# Patient Record
Sex: Female | Born: 1955 | Race: White | Hispanic: No | Marital: Married
Health system: Southern US, Academic
[De-identification: ages and names within clinical notes are randomized; demographics above are authoritative.]

## PROBLEM LIST (undated history)

## (undated) ENCOUNTER — Encounter

## (undated) ENCOUNTER — Encounter: Attending: Clinical | Primary: Clinical

## (undated) ENCOUNTER — Encounter
Attending: Student in an Organized Health Care Education/Training Program | Primary: Student in an Organized Health Care Education/Training Program

## (undated) ENCOUNTER — Encounter: Payer: MEDICAID | Attending: Family | Primary: Family

## (undated) ENCOUNTER — Telehealth: Attending: Family | Primary: Family

## (undated) ENCOUNTER — Telehealth
Attending: Student in an Organized Health Care Education/Training Program | Primary: Student in an Organized Health Care Education/Training Program

## (undated) ENCOUNTER — Ambulatory Visit

## (undated) ENCOUNTER — Telehealth: Attending: Internal Medicine | Primary: Internal Medicine

## (undated) ENCOUNTER — Telehealth: Attending: Clinical | Primary: Clinical

## (undated) ENCOUNTER — Telehealth

## (undated) ENCOUNTER — Encounter: Attending: Internal Medicine | Primary: Internal Medicine

## (undated) ENCOUNTER — Encounter: Attending: MOHS-Micrographic Surgery | Primary: MOHS-Micrographic Surgery

## (undated) ENCOUNTER — Encounter: Attending: Family | Primary: Family

## (undated) ENCOUNTER — Encounter: Payer: MEDICAID | Attending: Clinical | Primary: Clinical

## (undated) ENCOUNTER — Ambulatory Visit: Attending: Internal Medicine | Primary: Internal Medicine

## (undated) ENCOUNTER — Telehealth: Attending: MOHS-Micrographic Surgery | Primary: MOHS-Micrographic Surgery

## (undated) ENCOUNTER — Telehealth: Attending: Cardiovascular Disease | Primary: Cardiovascular Disease

## (undated) ENCOUNTER — Encounter
Attending: Pharmacist Clinician (PhC)/ Clinical Pharmacy Specialist | Primary: Pharmacist Clinician (PhC)/ Clinical Pharmacy Specialist

## (undated) ENCOUNTER — Encounter
Payer: MEDICAID | Attending: Student in an Organized Health Care Education/Training Program | Primary: Student in an Organized Health Care Education/Training Program

## (undated) ENCOUNTER — Encounter: Payer: MEDICAID | Attending: Gastroenterology | Primary: Gastroenterology

## (undated) ENCOUNTER — Ambulatory Visit
Attending: Pharmacist Clinician (PhC)/ Clinical Pharmacy Specialist | Primary: Pharmacist Clinician (PhC)/ Clinical Pharmacy Specialist

## (undated) ENCOUNTER — Encounter: Attending: Gastroenterology | Primary: Gastroenterology

## (undated) ENCOUNTER — Ambulatory Visit: Payer: MEDICAID

## (undated) ENCOUNTER — Non-Acute Institutional Stay
Payer: MEDICAID | Attending: Student in an Organized Health Care Education/Training Program | Primary: Student in an Organized Health Care Education/Training Program

## (undated) ENCOUNTER — Telehealth: Attending: Dermatology | Primary: Dermatology

## (undated) ENCOUNTER — Encounter: Attending: Cardiovascular Disease | Primary: Cardiovascular Disease

## (undated) ENCOUNTER — Encounter: Payer: MEDICAID | Attending: Psychiatry | Primary: Psychiatry

## (undated) ENCOUNTER — Ambulatory Visit
Attending: Student in an Organized Health Care Education/Training Program | Primary: Student in an Organized Health Care Education/Training Program

## (undated) ENCOUNTER — Ambulatory Visit: Attending: Family | Primary: Family

## (undated) ENCOUNTER — Non-Acute Institutional Stay: Payer: MEDICAID

## (undated) ENCOUNTER — Ambulatory Visit: Attending: Dermatology | Primary: Dermatology

## (undated) ENCOUNTER — Telehealth: Attending: Nephrology | Primary: Nephrology

## (undated) ENCOUNTER — Encounter: Payer: MEDICAID | Attending: Emergency Medicine | Primary: Emergency Medicine

## (undated) DIAGNOSIS — E119 Type 2 diabetes mellitus without complications: Secondary | ICD-10-CM

## (undated) DIAGNOSIS — G629 Polyneuropathy, unspecified: Secondary | ICD-10-CM

## (undated) DIAGNOSIS — M359 Systemic involvement of connective tissue, unspecified: Secondary | ICD-10-CM

## (undated) DIAGNOSIS — IMO0002 Reserved for concepts with insufficient information to code with codable children: Secondary | ICD-10-CM

## (undated) DIAGNOSIS — I10 Essential (primary) hypertension: Secondary | ICD-10-CM

## (undated) DIAGNOSIS — M1990 Unspecified osteoarthritis, unspecified site: Secondary | ICD-10-CM

## (undated) DIAGNOSIS — L120 Bullous pemphigoid: Secondary | ICD-10-CM

## (undated) DIAGNOSIS — J45909 Unspecified asthma, uncomplicated: Secondary | ICD-10-CM

## (undated) DIAGNOSIS — M329 Systemic lupus erythematosus, unspecified: Secondary | ICD-10-CM

## (undated) HISTORY — PX: ABDOMINAL SURGERY: SHX537

## (undated) HISTORY — PX: ABDOMINAL HYSTERECTOMY: SHX81

---
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
## 2004-12-15 ENCOUNTER — Emergency Department: Payer: Self-pay | Admitting: Emergency Medicine

## 2005-04-30 ENCOUNTER — Other Ambulatory Visit: Payer: Self-pay

## 2005-04-30 ENCOUNTER — Inpatient Hospital Stay: Payer: Self-pay

## 2005-11-29 ENCOUNTER — Emergency Department: Payer: Self-pay | Admitting: General Practice

## 2005-11-29 ENCOUNTER — Other Ambulatory Visit: Payer: Self-pay

## 2005-12-03 ENCOUNTER — Ambulatory Visit: Payer: Self-pay | Admitting: General Practice

## 2007-07-29 ENCOUNTER — Emergency Department: Payer: Self-pay | Admitting: Emergency Medicine

## 2007-10-24 ENCOUNTER — Other Ambulatory Visit: Payer: Self-pay

## 2007-10-24 ENCOUNTER — Emergency Department: Payer: Self-pay | Admitting: Emergency Medicine

## 2008-03-25 ENCOUNTER — Inpatient Hospital Stay: Payer: Self-pay | Admitting: Internal Medicine

## 2009-01-16 ENCOUNTER — Emergency Department: Payer: Self-pay | Admitting: Emergency Medicine

## 2012-05-29 ENCOUNTER — Emergency Department: Payer: Self-pay | Admitting: Internal Medicine

## 2012-05-29 LAB — URINALYSIS, COMPLETE
Bilirubin,UR: NEGATIVE
Blood: NEGATIVE
Glucose,UR: NEGATIVE mg/dL (ref 0–75)
Hyaline Cast: 33
Nitrite: POSITIVE
Ph: 5 (ref 4.5–8.0)
Protein: NEGATIVE
RBC,UR: 7 /HPF (ref 0–5)
Specific Gravity: 1.016 (ref 1.003–1.030)
Squamous Epithelial: 3

## 2012-05-29 LAB — COMPREHENSIVE METABOLIC PANEL
Albumin: 4.3 g/dL (ref 3.4–5.0)
Alkaline Phosphatase: 140 U/L — ABNORMAL HIGH (ref 50–136)
Bilirubin,Total: 0.3 mg/dL (ref 0.2–1.0)
Chloride: 100 mmol/L (ref 98–107)
Co2: 25 mmol/L (ref 21–32)
Creatinine: 2.18 mg/dL — ABNORMAL HIGH (ref 0.60–1.30)
EGFR (Non-African Amer.): 25 — ABNORMAL LOW
Osmolality: 271 (ref 275–301)
Sodium: 132 mmol/L — ABNORMAL LOW (ref 136–145)
Total Protein: 8.7 g/dL — ABNORMAL HIGH (ref 6.4–8.2)

## 2012-05-29 LAB — CBC
HCT: 35.3 % (ref 35.0–47.0)
HGB: 11.1 g/dL — ABNORMAL LOW (ref 12.0–16.0)
RDW: 18.5 % — ABNORMAL HIGH (ref 11.5–14.5)
WBC: 10.7 10*3/uL (ref 3.6–11.0)

## 2012-05-29 LAB — LIPASE, BLOOD: Lipase: 389 U/L (ref 73–393)

## 2016-12-24 MED ORDER — LISINOPRIL 10 MG-HYDROCHLOROTHIAZIDE 12.5 MG TABLET
ORAL_TABLET | Freq: Every day | ORAL | 3 refills | 0 days | Status: CP
Start: 2016-12-24 — End: 2017-02-21

## 2016-12-25 ENCOUNTER — Ambulatory Visit: Admission: RE | Admit: 2016-12-25 | Discharge: 2016-12-25 | Disposition: A | Discharge status: Home / Self Care

## 2016-12-25 DIAGNOSIS — Z8719 Personal history of other diseases of the digestive system: Principal | ICD-10-CM

## 2017-01-01 ENCOUNTER — Ambulatory Visit: Admission: RE | Admit: 2017-01-01 | Discharge: 2017-01-01 | Disposition: A | Discharge status: Home / Self Care

## 2017-01-01 DIAGNOSIS — Z8719 Personal history of other diseases of the digestive system: Principal | ICD-10-CM

## 2017-01-08 ENCOUNTER — Ambulatory Visit: Admission: RE | Admit: 2017-01-08 | Discharge: 2017-01-08 | Disposition: A | Discharge status: Home / Self Care

## 2017-01-08 DIAGNOSIS — Z8719 Personal history of other diseases of the digestive system: Principal | ICD-10-CM

## 2017-02-21 ENCOUNTER — Ambulatory Visit
Admission: RE | Admit: 2017-02-21 | Discharge: 2017-02-21 | Disposition: A | Discharge status: Home / Self Care | Attending: Student in an Organized Health Care Education/Training Program | Admitting: Student in an Organized Health Care Education/Training Program

## 2017-02-21 ENCOUNTER — Ambulatory Visit: Admission: RE | Admit: 2017-02-21 | Discharge: 2017-02-21 | Disposition: A | Discharge status: Home / Self Care

## 2017-02-21 DIAGNOSIS — E039 Hypothyroidism, unspecified: Secondary | ICD-10-CM

## 2017-02-21 DIAGNOSIS — Z8719 Personal history of other diseases of the digestive system: Principal | ICD-10-CM

## 2017-02-21 DIAGNOSIS — Z1211 Encounter for screening for malignant neoplasm of colon: Secondary | ICD-10-CM

## 2017-02-21 DIAGNOSIS — Z1239 Encounter for other screening for malignant neoplasm of breast: Secondary | ICD-10-CM

## 2017-02-21 DIAGNOSIS — Z122 Encounter for screening for malignant neoplasm of respiratory organs: Secondary | ICD-10-CM

## 2017-02-21 DIAGNOSIS — I10 Essential (primary) hypertension: Principal | ICD-10-CM

## 2017-02-21 DIAGNOSIS — H16139 Photokeratitis, unspecified eye: Secondary | ICD-10-CM

## 2017-02-21 DIAGNOSIS — Z87891 Personal history of nicotine dependence: Secondary | ICD-10-CM

## 2017-02-21 MED ORDER — LISINOPRIL 10 MG-HYDROCHLOROTHIAZIDE 12.5 MG TABLET
ORAL_TABLET | Freq: Every day | ORAL | 3 refills | 0.00000 days | Status: CP
Start: 2017-02-21 — End: 2017-09-12

## 2017-03-05 ENCOUNTER — Ambulatory Visit: Admission: RE | Admit: 2017-03-05 | Discharge: 2017-03-05 | Disposition: A | Discharge status: Home / Self Care

## 2017-03-12 ENCOUNTER — Ambulatory Visit: Admission: RE | Admit: 2017-03-12 | Discharge: 2017-03-12 | Attending: Dermatology | Admitting: Dermatology

## 2017-03-12 DIAGNOSIS — H16139 Photokeratitis, unspecified eye: Principal | ICD-10-CM

## 2017-03-12 DIAGNOSIS — L820 Inflamed seborrheic keratosis: Secondary | ICD-10-CM

## 2017-03-12 DIAGNOSIS — D485 Neoplasm of uncertain behavior of skin: Secondary | ICD-10-CM

## 2017-03-12 DIAGNOSIS — L732 Hidradenitis suppurativa: Secondary | ICD-10-CM

## 2017-03-12 MED ORDER — DOXYCYCLINE HYCLATE 100 MG TABLET
ORAL_TABLET | Freq: Two times a day (BID) | ORAL | 2 refills | 0.00000 days | Status: CP
Start: 2017-03-12 — End: 2017-06-10

## 2017-03-15 MED ORDER — TRIAMCINOLONE ACETONIDE 0.1 % TOPICAL OINTMENT
INTRAMUSCULAR | 1 refills | 0.00000 days | Status: CP
Start: 2017-03-15 — End: 2017-10-28

## 2017-04-10 ENCOUNTER — Ambulatory Visit: Admission: RE | Admit: 2017-04-10 | Discharge: 2017-04-10

## 2017-04-10 DIAGNOSIS — H2513 Age-related nuclear cataract, bilateral: Secondary | ICD-10-CM

## 2017-04-10 DIAGNOSIS — H52203 Unspecified astigmatism, bilateral: Secondary | ICD-10-CM

## 2017-04-10 DIAGNOSIS — H5213 Myopia, bilateral: Principal | ICD-10-CM

## 2017-08-08 ENCOUNTER — Emergency Department
Admission: EM | Admit: 2017-08-08 | Discharge: 2017-08-09 | Disposition: A | Discharge status: Home / Self Care | Payer: Self-pay | Attending: Emergency Medicine | Admitting: Emergency Medicine

## 2017-08-08 ENCOUNTER — Other Ambulatory Visit: Payer: Self-pay

## 2017-08-08 ENCOUNTER — Emergency Department: Payer: Self-pay

## 2017-08-08 DIAGNOSIS — R42 Dizziness and giddiness: Secondary | ICD-10-CM | POA: Insufficient documentation

## 2017-08-08 DIAGNOSIS — R0789 Other chest pain: Secondary | ICD-10-CM | POA: Insufficient documentation

## 2017-08-08 DIAGNOSIS — F17200 Nicotine dependence, unspecified, uncomplicated: Secondary | ICD-10-CM | POA: Insufficient documentation

## 2017-08-08 DIAGNOSIS — I10 Essential (primary) hypertension: Secondary | ICD-10-CM | POA: Insufficient documentation

## 2017-08-08 DIAGNOSIS — M25512 Pain in left shoulder: Secondary | ICD-10-CM | POA: Insufficient documentation

## 2017-08-08 HISTORY — DX: Essential (primary) hypertension: I10

## 2017-08-08 LAB — BASIC METABOLIC PANEL
Anion gap: 9 (ref 5–15)
BUN: 25 mg/dL — ABNORMAL HIGH (ref 6–20)
CHLORIDE: 97 mmol/L — AB (ref 101–111)
CO2: 22 mmol/L (ref 22–32)
CREATININE: 1.24 mg/dL — AB (ref 0.44–1.00)
Calcium: 9 mg/dL (ref 8.9–10.3)
GFR calc Af Amer: 54 mL/min — ABNORMAL LOW (ref >60)
GFR calc non Af Amer: 46 mL/min — ABNORMAL LOW (ref >60)
GLUCOSE: 106 mg/dL — AB (ref 65–99)
Potassium: 4.5 mmol/L (ref 3.5–5.1)
SODIUM: 128 mmol/L — AB (ref 135–145)

## 2017-08-08 LAB — TROPONIN I: Troponin I: <0.03 ng/mL (ref <0.03)

## 2017-08-08 LAB — CBC
HCT: 33.7 % — ABNORMAL LOW (ref 35.0–47.0)
Hemoglobin: 11.3 g/dL — ABNORMAL LOW (ref 12.0–16.0)
MCH: 26.5 pg (ref 26.0–34.0)
MCHC: 33.3 g/dL (ref 32.0–36.0)
MCV: 79.3 fL — AB (ref 80.0–100.0)
PLATELETS: 268 10*3/uL (ref 150–440)
RBC: 4.25 MIL/uL (ref 3.80–5.20)
RDW: 15.6 % — AB (ref 11.5–14.5)
WBC: 7.5 10*3/uL (ref 3.6–11.0)

## 2017-08-08 MED ORDER — OXYCODONE-ACETAMINOPHEN 5-325 MG PO TABS
2.0000 | ORAL_TABLET | Freq: Once | ORAL | Status: AC
  Administered 2017-08-09: 2 via ORAL
  Filled 2017-08-08: qty 2

## 2017-08-08 NOTE — ED Triage Notes (Signed)
Pt arrives to ED from home via [facility] with c/o arm pain, dizziness, nausea, and chest pain x7 hrs. 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 Pt reports left-sided CP with radiation into the LEFT arm with numbness in her LEFT hand.

## 2017-08-08 NOTE — ED Provider Notes (Signed)
[facility] Emergency Department Provider Note  ____________________________________________   First MD Initiated Contact with Patient 08/08/17 2347     (approximate)  I have reviewed the triage vital signs and the nursing notes.   HISTORY  Chief Complaint Arm Pain; Chest Pain; and Dizziness    HPI Christine Nichols is a 62 y.o. female who presents for evaluation of gradually worsening pain in her left shoulder over a month or more.  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  She reports that moving her arm makes the pain much worse and rest makes it a little bit better.  It aches at rest but it has sharp stabbing pains whenever she moves it.  She describes it as severe.  Over the last couple of days it is started radiating into her chest and her back.  She denies fever/chills, upper respiratory symptoms such as congestion, vomiting, abdominal pain, dysuria.  She has not contacted an orthopedic surgeon about this issue.  She denies having a primary care doctor.  There is a triage note about it going on for 7 hours but she is very clear that it has been gradually worsening over an extended period of time and that today was the worst it has been but this is not an acute issue even though it is acutely worse.  No specific history of trauma or injury.   Past Medical History:  Diagnosis Date  . Hypertension     There are no active problems to display for this patient.   Past Surgical History:  Procedure Laterality Date  . ABDOMINAL SURGERY      Prior to Admission medications   Medication Sig Start Date End Date Taking? Authorizing Provider  docusate sodium (COLACE) 100 MG capsule Take 1 tablet once or twice daily as needed for constipation while taking narcotic pain medicine 08/09/17   [name], MD  HYDROcodone-acetaminophen (NORCO/VICODIN) 5-325 MG tablet Take 1-2 tablets by mouth every 6  (six) hours as needed for moderate pain or severe pain. 08/09/17   [name], MD    Allergies Sulfa antibiotics; Codeine; and Penicillins  No family history on file.  Social History Social History   Tobacco Use  . Smoking status: Heavy Tobacco Smoker  . Smokeless tobacco: Never Used  Substance Use Topics  . Alcohol use: Not on file  . Drug use: Not on file    Review of Systems Constitutional: No fever/chills Eyes: No visual changes. ENT: No sore throat. Cardiovascular: Left shoulder pain radiating into the chest and back Respiratory: Denies shortness of breath. Gastrointestinal: No abdominal pain.  No nausea, no vomiting.  No diarrhea.  No constipation. Genitourinary: Negative for dysuria. Musculoskeletal: Severe left shoulder pain radiating into the chest and back Integumentary: Negative for rash. Neurological: Negative for headaches, focal weakness or numbness.   ____________________________________________   PHYSICAL EXAM:  VITAL SIGNS: ED Triage Vitals  Enc Vitals Group     BP 08/08/17 1935 121/81     Pulse Rate 08/08/17 1935 75     Resp 08/08/17 1935 20     Temp 08/08/17 1935 98 F (36.7 C)     Temp Source 08/08/17 1935 Oral     SpO2 08/08/17 1935 100 %     Weight 08/08/17 1907 72.6 kg (160 lb)     Height 08/08/17 1907 1.676 m (5\' 6" )     Head Circumference --  Peak Flow --      Pain Score 08/08/17 1907 10     Pain Loc --      Pain Edu? --      Excl. in [place]? --     Constitutional: Alert and oriented. Well appearing and in no acute distress. Eyes: Conjunctivae are normal.  Head: Atraumatic. Nose: No congestion/rhinnorhea. Mouth/Throat: Mucous membranes are moist. Neck: No stridor.  No meningeal signs.   Cardiovascular: Normal rate, regular rhythm. Good peripheral circulation. Grossly normal heart sounds. Respiratory: Normal respiratory effort.  No retractions.  Expiratory wheezing throughout Gastrointestinal: Soft and nontender. No  distention.  Musculoskeletal: Severe pain and tenderness with any movement of the left arm.  With coaxing and distraction I can passively range her shoulder with no limitation of the range of movements, but the patient reports highly reproducible and severe pain with any movement of the shoulder.  Elbow and wrist do not seem involved although the patient reports pain throughout the arm Neurologic:  Normal speech and language. No gross focal neurologic deficits are appreciated.  Skin:  Skin is warm, dry and intact. No rash noted including in the LUE Psychiatric: Mood and affect are normal. Speech and behavior are normal.  ____________________________________________   LABS (all labs ordered are listed, but only abnormal results are displayed)  Labs Reviewed  BASIC METABOLIC PANEL - Abnormal; Notable for the following components:      Result Value   Sodium 128 (*)    Chloride 97 (*)    Glucose, Bld 106 (*)    BUN 25 (*)    Creatinine, Ser 1.24 (*)    GFR calc non Af Amer 46 (*)    GFR calc Af Amer 54 (*)    All other components within normal limits  CBC - Abnormal; Notable for the following components:   Hemoglobin 11.3 (*)    HCT 33.7 (*)    MCV 79.3 (*)    RDW 15.6 (*)    All other components within normal limits  TROPONIN I   ____________________________________________  EKG  ED ECG REPORT I, [name], the attending physician, personally viewed and interpreted this ECG.  Date: 08/08/2017 EKG Time: 19:15 Rate: 73 Rhythm: normal sinus rhythm QRS Axis: normal Intervals: normal ST/T Wave abnormalities: normal Narrative Interpretation: no evidence of acute ischemia  ____________________________________________  RADIOLOGY   ED MD interpretation: Chronic bronchitis, no acute abnormality  Official radiology report(s): Dg Chest 2 View  Result Date: 08/08/2017 CLINICAL DATA:  Dizziness, nausea and chest pain x7 hours. Heavy smoker. EXAM: CHEST  2 VIEW COMPARISON:   01/16/2009 FINDINGS: The heart size and mediastinal contours are within normal limits. Diffuse bilateral coarsened interstitial lung markings compatible with smoking related bronchitic change is identified. No pneumonia or CHF. The visualized skeletal structures are unremarkable. IMPRESSION: Smoking related bronchitic change is believed to account for coarsened interstitial lung markings bilaterally. Electronically Signed   By: Ashley Royalty M.D.   On: 08/08/2017 19:33   Dg Shoulder Left  Result Date: 08/09/2017 CLINICAL DATA:  62 year old female with left shoulder pain for 1 month with no known injury. EXAM: LEFT SHOULDER - 2+ VIEW COMPARISON:  Chest radiographs 08/08/2017, and earlier. FINDINGS: Bone mineralization is within normal limits. No glenohumeral joint dislocation. The left clavicle and scapula appear intact and normally aligned. Negative visible left ribs and lung parenchyma. IMPRESSION: Normal for age radiographic appearance of the left shoulder. Electronically Signed   By: Genevie Ann M.D.   On: 08/09/2017 00:29  ____________________________________________   PROCEDURES  Critical Care performed: No   Procedure(s) performed:   Procedures   ____________________________________________   INITIAL IMPRESSION / ASSESSMENT AND PLAN / ED COURSE  As part of my medical decision making, I reviewed the following data within the [place] notes reviewed and incorporated, radiographs reviewed, labs reviewed, EKG reviewed, [place] Controlled Substance database reviewed    Differential diagnosis includes, but is not limited to, musculoskeletal left shoulder pain including osteoarthritis, acute injury to the left shoulder including fractures/dislocations, ACS, aortic dissection, pulmonary embolism.  The patient's exam and history are all highly consistent with a chronic left shoulder injury possibly including osteoarthritis but she has highly reproducible left upper  extremity pain and tenderness with any amount of movement.  Some of it she admits is anticipation because with distraction and coaxing I am able to fully move and range her arm although she resists mostly with abduction of the shoulder.  Radiographs are pending but I explained to the patient that she must follow-up with orthopedics.  I reviewed the patient's prescription history over the last 24 months in the multi-state controlled substances database(s) that includes [place], [place], [place], [place], [place], [place], [place], [place], [place], [place], [place], [place], [place], [place], and [place].  The patient has filled no controlled substances during that time.  I will provide some pain control and a sling but explained to her that orthopedics follow-up is a necessity.  I gave my usual musculoskeletal pain recommendations and return precautions. Clinical Course as of Aug 09 50  Fri Aug 09, 2017  0037 Normal radiographs for age based on radiologist's report.  Will continue with plan as described above. DG Shoulder Left [CF]    Clinical Course User Index [CF] [name], MD    ____________________________________________  FINAL CLINICAL IMPRESSION(S) / ED DIAGNOSES  Final diagnoses:  Left shoulder pain, unspecified chronicity     MEDICATIONS GIVEN DURING THIS VISIT:  Medications  oxyCODONE-acetaminophen (PERCOCET/ROXICET) 5-325 MG per tablet 2 tablet (2 tablets Oral Given 08/09/17 0032)     ED Discharge Orders        Ordered    HYDROcodone-acetaminophen (NORCO/VICODIN) 5-325 MG tablet  Every 6 hours PRN     08/09/17 0052    docusate sodium (COLACE) 100 MG capsule     08/09/17 0052       Note:  This document was prepared using Dragon voice recognition software and may include unintentional dictation errors.    [name], MD 08/09/17 [phone]

## 2017-08-08 NOTE — ED Notes (Signed)
First nurse note   Presents via ems with left shoulder for couple of weeks  States pain radiates into back  States she does lift a lot of heavy things at work

## 2017-08-09 ENCOUNTER — Emergency Department: Payer: Self-pay

## 2017-08-09 MED ORDER — DOCUSATE SODIUM 100 MG PO CAPS: ORAL_CAPSULE | ORAL | 0 refills | Status: AC

## 2017-08-09 MED ORDER — HYDROCODONE-ACETAMINOPHEN 5-325 MG PO TABS: 1.0000 | ORAL_TABLET | Freq: Four times a day (QID) | ORAL | 0 refills | Status: DC | PRN

## 2017-08-09 NOTE — Discharge Instructions (Signed)
Your workup was reassuring tonight with normal labs and x-rays including of your left shoulder.  We believe that frequent and chronic use of the shoulder has led to some musculoskeletal pain and long-term injury.  There is no fracture or dislocation in your shoulder and it likely needs some time to heal, but you must follow-up with an orthopedic specialist to see if any other treatment or evaluation is recommended.  Use the provided sling only as needed because if you do not continue to move your shoulder and exercise it, it can become more tight and frozen in place.  Use over-the-counter medications such as Tylenol and Aleve as much as possible and review the information regarding the use of ice packs and routine care of injuries. Take Norco as prescribed for severe pain. Do not drink alcohol, drive or participate in any other potentially dangerous activities while taking this medication as it may make you sleepy. Do not take this medication with any other sedating medications, either prescription or over-the-counter. If you were prescribed Percocet or Vicodin, do not take these with acetaminophen (Tylenol) as it is already contained within these medications.   This medication is an opiate (or narcotic) pain medication and can be habit forming.  Use it as little as possible to achieve adequate pain control.  Do not use or use it with extreme caution if you have a history of opiate abuse or dependence.  If you are on a pain contract with your primary care doctor or a pain specialist, be sure to let them know you were prescribed this medication today from the [facility] Emergency Department.  This medication is intended for your use only - do not give any to anyone else and keep it in a secure place where nobody else, especially children, have access to it.  It will also cause or worsen constipation, so you may want to consider taking an over-the-counter stool softener while you are taking this  medication.    Return to the emergency department if you develop new or worsening symptoms that concern you.

## 2017-08-09 NOTE — ED Notes (Signed)
Pt started having back and chest pain today that felt like it was wrapping around her body. States now it is all in her L shoulder and arm and feels like lightening in her arm

## 2017-08-14 ENCOUNTER — Ambulatory Visit: Admit: 2017-08-14 | Discharge: 2017-08-14

## 2017-08-14 ENCOUNTER — Ambulatory Visit
Admit: 2017-08-14 | Discharge: 2017-08-14 | Attending: Student in an Organized Health Care Education/Training Program | Primary: Student in an Organized Health Care Education/Training Program

## 2017-08-14 DIAGNOSIS — S46012A Strain of muscle(s) and tendon(s) of the rotator cuff of left shoulder, initial encounter: Principal | ICD-10-CM

## 2017-08-14 DIAGNOSIS — G8929 Other chronic pain: Secondary | ICD-10-CM

## 2017-08-14 DIAGNOSIS — I10 Essential (primary) hypertension: Secondary | ICD-10-CM

## 2017-08-14 DIAGNOSIS — M25512 Pain in left shoulder: Principal | ICD-10-CM

## 2017-08-14 MED ORDER — PREDNISONE 10 MG TABLET
ORAL_TABLET | 0 refills | 0 days | Status: CP
Start: 2017-08-14 — End: 2017-09-12

## 2017-08-29 ENCOUNTER — Ambulatory Visit: Admit: 2017-08-29 | Discharge: 2017-08-30

## 2017-08-29 DIAGNOSIS — M5412 Radiculopathy, cervical region: Secondary | ICD-10-CM

## 2017-08-29 DIAGNOSIS — S46012A Strain of muscle(s) and tendon(s) of the rotator cuff of left shoulder, initial encounter: Principal | ICD-10-CM

## 2017-08-29 DIAGNOSIS — G5602 Carpal tunnel syndrome, left upper limb: Secondary | ICD-10-CM

## 2017-09-12 ENCOUNTER — Ambulatory Visit: Admit: 2017-09-12 | Discharge: 2017-09-12 | Attending: Internal Medicine | Primary: Internal Medicine

## 2017-09-12 ENCOUNTER — Other Ambulatory Visit: Admit: 2017-09-12 | Discharge: 2017-09-12

## 2017-09-12 DIAGNOSIS — F329 Major depressive disorder, single episode, unspecified: Secondary | ICD-10-CM

## 2017-09-12 DIAGNOSIS — D508 Other iron deficiency anemias: Principal | ICD-10-CM

## 2017-09-12 DIAGNOSIS — I10 Essential (primary) hypertension: Secondary | ICD-10-CM

## 2017-09-12 DIAGNOSIS — Z7289 Other problems related to lifestyle: Secondary | ICD-10-CM

## 2017-09-12 DIAGNOSIS — E039 Hypothyroidism, unspecified: Secondary | ICD-10-CM

## 2017-09-12 DIAGNOSIS — Z1211 Encounter for screening for malignant neoplasm of colon: Secondary | ICD-10-CM

## 2017-09-12 DIAGNOSIS — Z87891 Personal history of nicotine dependence: Secondary | ICD-10-CM

## 2017-09-12 DIAGNOSIS — Z1231 Encounter for screening mammogram for malignant neoplasm of breast: Secondary | ICD-10-CM

## 2017-09-12 MED ORDER — OMEPRAZOLE 20 MG CAPSULE,DELAYED RELEASE
ORAL_CAPSULE | Freq: Every day | ORAL | 3 refills | 0 days
Start: 2017-09-12 — End: 2018-08-25

## 2017-09-12 MED ORDER — LISINOPRIL 30 MG TABLET
ORAL_TABLET | Freq: Every day | ORAL | 3 refills | 0 days | Status: CP
Start: 2017-09-12 — End: 2017-11-04

## 2017-09-30 ENCOUNTER — Encounter: Admit: 2017-09-30 | Discharge: 2017-09-30 | Payer: MEDICAID

## 2017-09-30 ENCOUNTER — Encounter: Admit: 2017-09-30 | Discharge: 2017-09-30 | Payer: MEDICAID | Attending: Internal Medicine | Primary: Internal Medicine

## 2017-09-30 DIAGNOSIS — I10 Essential (primary) hypertension: Principal | ICD-10-CM

## 2017-09-30 DIAGNOSIS — E871 Hypo-osmolality and hyponatremia: Secondary | ICD-10-CM

## 2017-09-30 DIAGNOSIS — J45901 Unspecified asthma with (acute) exacerbation: Secondary | ICD-10-CM

## 2017-09-30 DIAGNOSIS — F329 Major depressive disorder, single episode, unspecified: Secondary | ICD-10-CM

## 2017-09-30 DIAGNOSIS — B019 Varicella without complication: Secondary | ICD-10-CM

## 2017-09-30 MED ORDER — CITALOPRAM 40 MG TABLET
ORAL_TABLET | Freq: Every day | ORAL | 24 refills | 0.00000 days | Status: CP
Start: 2017-09-30 — End: 2017-09-30

## 2017-09-30 MED ORDER — ALBUTEROL SULFATE HFA 90 MCG/ACTUATION AEROSOL INHALER: 2 | g | 11 refills | 0 days

## 2017-09-30 MED ORDER — VALACYCLOVIR 500 MG TABLET
Freq: Every day | ORAL | 0 refills | 0.00000 days | Status: CP
Start: 2017-09-30 — End: 2017-09-30

## 2017-09-30 MED ORDER — VALACYCLOVIR 500 MG TABLET: 500 mg | tablet | Freq: Every day | 0 refills | 0 days | Status: AC

## 2017-09-30 MED ORDER — CITALOPRAM 40 MG TABLET: 40 mg | tablet | Freq: Every day | 3 refills | 0 days | Status: AC

## 2017-09-30 MED ORDER — ALBUTEROL SULFATE HFA 90 MCG/ACTUATION AEROSOL INHALER
RESPIRATORY_TRACT | 3 refills | 0.00000 days | Status: CP | PRN
Start: 2017-09-30 — End: 2018-01-29

## 2017-09-30 MED FILL — ALBUTEROL HFA/90MCG/AER: ALBUTEROL HFA/90MCG/AER | 25 days supply | Qty: 1 | Fill #0

## 2017-09-30 MED FILL — CITALOPRAM HYDROBROMIDE/40MG/TABS: CITALOPRAM HYDROBROMIDE/40MG/TABS | 14 days supply | Qty: 14 | Fill #0

## 2017-09-30 MED FILL — VALACYCLOVIR/500MG/TAB: VALACYCLOVIR/500MG/TAB | 7 days supply | Qty: 7 | Fill #0

## 2017-10-09 MED ORDER — CITALOPRAM 40 MG TABLET
ORAL_TABLET | Freq: Every day | ORAL | 3 refills | 0 days | Status: CP
Start: 2017-10-09 — End: 2018-10-30

## 2017-10-09 MED ORDER — VALACYCLOVIR 500 MG TABLET
ORAL_TABLET | Freq: Every day | ORAL | 0 refills | 0 days | Status: CP
Start: 2017-10-09 — End: 2017-10-16

## 2017-10-16 ENCOUNTER — Encounter: Payer: Self-pay | Admitting: Emergency Medicine

## 2017-10-16 ENCOUNTER — Inpatient Hospital Stay
Admission: EM | Admit: 2017-10-16 | Discharge: 2017-10-24 | DRG: 596 | Disposition: A | Discharge status: Home / Self Care | Payer: Medicaid Other | Attending: Family Medicine | Admitting: Family Medicine

## 2017-10-16 ENCOUNTER — Other Ambulatory Visit: Payer: Self-pay

## 2017-10-16 DIAGNOSIS — Z882 Allergy status to sulfonamides status: Secondary | ICD-10-CM

## 2017-10-16 DIAGNOSIS — K5900 Constipation, unspecified: Secondary | ICD-10-CM | POA: Diagnosis present

## 2017-10-16 DIAGNOSIS — E871 Hypo-osmolality and hyponatremia: Secondary | ICD-10-CM | POA: Diagnosis present

## 2017-10-16 DIAGNOSIS — Z8711 Personal history of peptic ulcer disease: Secondary | ICD-10-CM | POA: Diagnosis not present

## 2017-10-16 DIAGNOSIS — Z79890 Hormone replacement therapy (postmenopausal): Secondary | ICD-10-CM

## 2017-10-16 DIAGNOSIS — Z9884 Bariatric surgery status: Secondary | ICD-10-CM

## 2017-10-16 DIAGNOSIS — R21 Rash and other nonspecific skin eruption: Secondary | ICD-10-CM | POA: Diagnosis present

## 2017-10-16 DIAGNOSIS — Z79899 Other long term (current) drug therapy: Secondary | ICD-10-CM | POA: Diagnosis not present

## 2017-10-16 DIAGNOSIS — K7460 Unspecified cirrhosis of liver: Secondary | ICD-10-CM

## 2017-10-16 DIAGNOSIS — Z880 Allergy status to penicillin: Secondary | ICD-10-CM

## 2017-10-16 DIAGNOSIS — I10 Essential (primary) hypertension: Secondary | ICD-10-CM | POA: Diagnosis present

## 2017-10-16 DIAGNOSIS — B182 Chronic viral hepatitis C: Secondary | ICD-10-CM

## 2017-10-16 DIAGNOSIS — L109 Pemphigus, unspecified: Secondary | ICD-10-CM | POA: Diagnosis present

## 2017-10-16 DIAGNOSIS — F17200 Nicotine dependence, unspecified, uncomplicated: Secondary | ICD-10-CM | POA: Diagnosis present

## 2017-10-16 DIAGNOSIS — D72829 Elevated white blood cell count, unspecified: Secondary | ICD-10-CM | POA: Diagnosis present

## 2017-10-16 DIAGNOSIS — B019 Varicella without complication: Secondary | ICD-10-CM | POA: Diagnosis present

## 2017-10-16 DIAGNOSIS — Z885 Allergy status to narcotic agent status: Secondary | ICD-10-CM | POA: Diagnosis not present

## 2017-10-16 DIAGNOSIS — D509 Iron deficiency anemia, unspecified: Secondary | ICD-10-CM | POA: Diagnosis present

## 2017-10-16 LAB — CBC WITH DIFFERENTIAL/PLATELET
BASOS ABS: 0 10*3/uL (ref 0–0.1)
BASOS PCT: 0 %
EOS PCT: 6 %
Eosinophils Absolute: 0.7 10*3/uL (ref 0–0.7)
HCT: 35 % (ref 35.0–47.0)
Hemoglobin: 11.5 g/dL — ABNORMAL LOW (ref 12.0–16.0)
Lymphocytes Relative: 14 %
Lymphs Abs: 1.7 10*3/uL (ref 1.0–3.6)
MCH: 25.8 pg — ABNORMAL LOW (ref 26.0–34.0)
MCHC: 32.7 g/dL (ref 32.0–36.0)
MCV: 78.8 fL — AB (ref 80.0–100.0)
MONO ABS: 1.4 10*3/uL — AB (ref 0.2–0.9)
Monocytes Relative: 12 %
Neutro Abs: 8.3 10*3/uL — ABNORMAL HIGH (ref 1.4–6.5)
Neutrophils Relative %: 68 %
PLATELETS: 412 10*3/uL (ref 150–440)
RBC: 4.45 MIL/uL (ref 3.80–5.20)
RDW: 16.3 % — ABNORMAL HIGH (ref 11.5–14.5)
WBC: 12.1 10*3/uL — ABNORMAL HIGH (ref 3.6–11.0)

## 2017-10-16 LAB — BASIC METABOLIC PANEL
ANION GAP: 6 (ref 5–15)
BUN: 14 mg/dL (ref 6–20)
CO2: 26 mmol/L (ref 22–32)
Calcium: 8.3 mg/dL — ABNORMAL LOW (ref 8.9–10.3)
Chloride: 101 mmol/L (ref 101–111)
Creatinine, Ser: 0.95 mg/dL (ref 0.44–1.00)
GLUCOSE: 90 mg/dL (ref 65–99)
Potassium: 4.3 mmol/L (ref 3.5–5.1)
Sodium: 133 mmol/L — ABNORMAL LOW (ref 135–145)

## 2017-10-16 MED ORDER — SODIUM CHLORIDE 0.9% FLUSH
3.0000 mL | INTRAVENOUS | Status: DC | PRN
  Administered 2017-10-18: 3 mL via INTRAVENOUS
  Filled 2017-10-16: qty 3

## 2017-10-16 MED ORDER — ACYCLOVIR SODIUM 50 MG/ML IV SOLN
10.0000 mg/kg | Freq: Three times a day (TID) | INTRAVENOUS | Status: DC
  Administered 2017-10-16 – 2017-10-21 (×16): 725 mg via INTRAVENOUS
  Filled 2017-10-16 (×19): qty 14.5

## 2017-10-16 MED ORDER — SODIUM CHLORIDE 0.9 % IV SOLN: 250.0000 mL | INTRAVENOUS | Status: DC | PRN

## 2017-10-16 MED ORDER — MORPHINE SULFATE (PF) 2 MG/ML IV SOLN
2.0000 mg | INTRAVENOUS | Status: DC | PRN
  Administered 2017-10-16 – 2017-10-22 (×11): 2 mg via INTRAVENOUS
  Filled 2017-10-16 (×11): qty 1

## 2017-10-16 MED ORDER — HYDROCODONE-ACETAMINOPHEN 5-325 MG PO TABS
1.0000 | ORAL_TABLET | ORAL | Status: DC | PRN
  Administered 2017-10-16 – 2017-10-24 (×22): 2 via ORAL
  Filled 2017-10-16 (×22): qty 2

## 2017-10-16 MED ORDER — ONDANSETRON HCL 4 MG/2ML IJ SOLN: 4.0000 mg | Freq: Four times a day (QID) | INTRAMUSCULAR | Status: DC | PRN

## 2017-10-16 MED ORDER — LEVOTHYROXINE SODIUM 50 MCG PO TABS
75.0000 ug | ORAL_TABLET | Freq: Every day | ORAL | Status: DC
  Administered 2017-10-17 – 2017-10-24 (×7): 75 ug via ORAL
  Filled 2017-10-16 (×7): qty 1

## 2017-10-16 MED ORDER — BISACODYL 5 MG PO TBEC
5.0000 mg | DELAYED_RELEASE_TABLET | Freq: Every day | ORAL | Status: DC | PRN
  Administered 2017-10-17 – 2017-10-20 (×2): 5 mg via ORAL
  Filled 2017-10-16 (×2): qty 1

## 2017-10-16 MED ORDER — SODIUM CHLORIDE 0.9 % IV BOLUS
1000.0000 mL | Freq: Once | INTRAVENOUS | Status: AC
  Administered 2017-10-16: 1000 mL via INTRAVENOUS

## 2017-10-16 MED ORDER — SENNOSIDES-DOCUSATE SODIUM 8.6-50 MG PO TABS
1.0000 | ORAL_TABLET | Freq: Every evening | ORAL | Status: DC | PRN
  Administered 2017-10-19 – 2017-10-21 (×3): 1 via ORAL
  Filled 2017-10-16 (×3): qty 1

## 2017-10-16 MED ORDER — LISINOPRIL 20 MG PO TABS
30.0000 mg | ORAL_TABLET | Freq: Every day | ORAL | Status: DC
  Administered 2017-10-16 – 2017-10-21 (×5): 30 mg via ORAL
  Filled 2017-10-16 (×7): qty 1

## 2017-10-16 MED ORDER — CITALOPRAM HYDROBROMIDE 20 MG PO TABS
40.0000 mg | ORAL_TABLET | Freq: Every day | ORAL | Status: DC
  Administered 2017-10-16 – 2017-10-24 (×9): 40 mg via ORAL
  Filled 2017-10-16 (×9): qty 2

## 2017-10-16 MED ORDER — ENOXAPARIN SODIUM 40 MG/0.4ML ~~LOC~~ SOLN
40.0000 mg | SUBCUTANEOUS | Status: DC
  Administered 2017-10-16 – 2017-10-23 (×8): 40 mg via SUBCUTANEOUS
  Filled 2017-10-16 (×9): qty 0.4

## 2017-10-16 MED ORDER — ALPRAZOLAM 0.5 MG PO TABS
0.5000 mg | ORAL_TABLET | Freq: Three times a day (TID) | ORAL | Status: DC | PRN
  Administered 2017-10-16 – 2017-10-23 (×7): 0.5 mg via ORAL
  Filled 2017-10-16 (×8): qty 1

## 2017-10-16 MED ORDER — NICOTINE 21 MG/24HR TD PT24
21.0000 mg | MEDICATED_PATCH | Freq: Every day | TRANSDERMAL | Status: DC
  Administered 2017-10-16 – 2017-10-24 (×9): 21 mg via TRANSDERMAL
  Filled 2017-10-16 (×9): qty 1

## 2017-10-16 MED ORDER — MORPHINE SULFATE (PF) 4 MG/ML IV SOLN
4.0000 mg | Freq: Once | INTRAVENOUS | Status: AC
  Administered 2017-10-16: 4 mg via INTRAVENOUS
  Filled 2017-10-16: qty 1

## 2017-10-16 MED ORDER — SODIUM CHLORIDE 0.9% FLUSH
3.0000 mL | Freq: Two times a day (BID) | INTRAVENOUS | Status: DC
  Administered 2017-10-16 – 2017-10-22 (×12): 3 mL via INTRAVENOUS

## 2017-10-16 MED ORDER — ACETAMINOPHEN 650 MG RE SUPP: 650.0000 mg | Freq: Four times a day (QID) | RECTAL | Status: DC | PRN

## 2017-10-16 MED ORDER — ACETAMINOPHEN 325 MG PO TABS
650.0000 mg | ORAL_TABLET | Freq: Four times a day (QID) | ORAL | Status: DC | PRN
  Administered 2017-10-23 – 2017-10-24 (×2): 650 mg via ORAL
  Filled 2017-10-16 (×2): qty 2

## 2017-10-16 MED ORDER — ALBUTEROL SULFATE (2.5 MG/3ML) 0.083% IN NEBU
2.5000 mg | INHALATION_SOLUTION | RESPIRATORY_TRACT | Status: DC | PRN
  Administered 2017-10-16 – 2017-10-24 (×2): 2.5 mg via RESPIRATORY_TRACT
  Filled 2017-10-16: qty 3

## 2017-10-16 MED ORDER — ONDANSETRON HCL 4 MG PO TABS: 4.0000 mg | ORAL_TABLET | Freq: Four times a day (QID) | ORAL | Status: DC | PRN

## 2017-10-16 NOTE — H&P (Signed)
[place] [facility] [place] NAME: Christine Nichols    MR#:  [id number]  DATE OF BIRTH:  [birth year]  DATE OF ADMISSION:  10/16/2017  PRIMARY CARE PHYSICIAN: Patient, No Pcp Per   REQUESTING/REFERRING PHYSICIAN: Dr. Archie Balboa.  CHIEF COMPLAINT:   Chief Complaint  Patient presents with  . Rash   Worsening rash all over the body. HISTORY OF PRESENT ILLNESS:  Christine Nichols  is a 62 y.o. female with a known history of hypertension.  Patient did present to ED with above chief complaints.  She was recently diagnosed with varicella-zoster, given total 2 courses of vacyclovir for 14 days without improvement.  The rash spread all over the body and worsening.  She feels painful but not itchy.  Dr. Archie Balboa start IV acyclovir and request admission. PAST MEDICAL HISTORY:   Past Medical History:  Diagnosis Date  . Hypertension     PAST SURGICAL HISTORY:   Past Surgical History:  Procedure Laterality Date  . ABDOMINAL SURGERY      SOCIAL HISTORY:   Social History   Tobacco Use  . Smoking status: Heavy Tobacco Smoker  . Smokeless tobacco: Never Used  Substance Use Topics  . Alcohol use: Never    Frequency: Never    FAMILY HISTORY:  No family history on file.  DRUG ALLERGIES:   Allergies  Allergen Reactions  . Sulfa Antibiotics Anaphylaxis  . Codeine Nausea Only  . Penicillins Rash    Has patient had a PCN reaction causing immediate rash, facial/tongue/throat swelling, SOB or lightheadedness with hypotension: Yes Has patient had a PCN reaction causing severe rash involving mucus membranes or skin necrosis: No Has patient had a PCN reaction that required hospitalization: No Has patient had a PCN reaction occurring within the last 10 years: No If all of the above answers are "NO", then may proceed with Cephalosporin use.    REVIEW OF SYSTEMS:   Review of Systems  Constitutional: Positive for malaise/fatigue. Negative for chills and  fever.  HENT: Negative for sore throat.   Eyes: Negative for blurred vision and double vision.  Respiratory: Negative for cough, hemoptysis, shortness of breath, wheezing and stridor.   Cardiovascular: Negative for chest pain, palpitations, orthopnea and leg swelling.  Gastrointestinal: Negative for abdominal pain, blood in stool, diarrhea, melena, nausea and vomiting.  Genitourinary: Negative for dysuria, flank pain and hematuria.  Musculoskeletal: Negative for back pain and joint pain.  Skin: Positive for rash.  Neurological: Negative for dizziness, sensory change, focal weakness, seizures, loss of consciousness, weakness and headaches.  Endo/Heme/Allergies: Negative for polydipsia.  Psychiatric/Behavioral: Negative for depression. The patient is not nervous/anxious.     MEDICATIONS AT HOME:   Prior to Admission medications   Medication Sig Start Date End Date Taking? Authorizing Provider  albuterol (PROVENTIL HFA;VENTOLIN HFA) 108 (90 Base) MCG/ACT inhaler Inhale 2 puffs into the lungs every 6 (six) hours as needed for wheezing or shortness of breath.   Yes [provider]  citalopram (CELEXA) 40 MG tablet Take 1 tablet by mouth daily. 10/10/17  Yes [provider]  levothyroxine (SYNTHROID, LEVOTHROID) 75 MCG tablet Take 75 mcg by mouth daily before breakfast.   Yes [provider]  lisinopril (PRINIVIL,ZESTRIL) 30 MG tablet Take 1 tablet by mouth daily. 10/10/17  Yes [provider]  valACYclovir (VALTREX) 500 MG tablet Take 1 tablet by mouth daily. 10/09/17  Yes [provider]  docusate sodium (COLACE) 100 MG capsule Take 1 tablet once or  twice daily as needed for constipation while taking narcotic pain medicine Patient not taking: Reported on 10/16/2017 08/09/17   [name], MD  HYDROcodone-acetaminophen (NORCO/VICODIN) 5-325 MG tablet Take 1-2 tablets by mouth every 6 (six) hours as needed for moderate pain or severe pain. Patient not  taking: Reported on 10/16/2017 08/09/17   [name], MD      VITAL SIGNS:  Blood pressure (!) 149/97, pulse 92, temperature 98.1 F (36.7 C), temperature source Oral, resp. rate 16, weight 160 lb (72.6 kg), SpO2 95 %.  PHYSICAL EXAMINATION:  Physical Exam  GENERAL:  62 y.o.-year-old patient lying in the bed with no acute distress.  EYES: Pupils equal, round, reactive to light and accommodation. No scleral icterus. Extraocular muscles intact.  HEENT: Head atraumatic, normocephalic. Oropharynx and nasopharynx clear.  NECK:  Supple, no jugular venous distention. No thyroid enlargement, no tenderness.  LUNGS: Normal breath sounds bilaterally, no wheezing, rales,rhonchi or crepitation. No use of accessory muscles of respiration.  CARDIOVASCULAR: S1, S2 normal. No murmurs, rubs, or gallops.  ABDOMEN: Soft, nontender, nondistended. Bowel sounds present. No organomegaly or mass.  EXTREMITIES: No pedal edema, cyanosis, or clubbing.  NEUROLOGIC: Cranial nerves II through XII are intact. Muscle strength 5/5 in all extremities. Sensation intact. Gait not checked.  PSYCHIATRIC: The patient is alert and oriented x 3.  SKIN: Rash all over the body.  LABORATORY PANEL:   CBC Recent Labs  Lab 10/16/17 1246  WBC 12.1*  HGB 11.5*  HCT 35.0  PLT 412   ------------------------------------------------------------------------------------------------------------------  Chemistries  Recent Labs  Lab 10/16/17 1246  NA 133*  K 4.3  CL 101  CO2 26  GLUCOSE 90  BUN 14  CREATININE 0.95  CALCIUM 8.3*   ------------------------------------------------------------------------------------------------------------------  Cardiac Enzymes No results for input(s): TROPONINI in the last 168 hours. ------------------------------------------------------------------------------------------------------------------  RADIOLOGY:  No results found.    IMPRESSION AND PLAN:   Severe varicella The  patient will be admitted to medical floor.  Air drop and contact isolation. Continue acyclovir IV every 8 hours.  ID consult.  Pain control. Leukocytosis.  Possible tooth reaction.  Follow-up CBC. Hyponatremia.  Normal saline IV and follow-up BMP. Hypertension.  Continue home hypertensive medication. Tobacco abuse.  Smoking cessation was consulted for 4 minutes. All the records are reviewed and case discussed with ED provider. Management plans discussed with the patient, family and they are in agreement.  CODE STATUS: Full code  TOTAL TIME TAKING CARE OF THIS PATIENT: 52 minutes.    Demetrios Loll M.D on 10/16/2017 at 6:16 PM  Between 7am to 6pm - Pager - [phone]  After 6pm go to [email or web address] - Proofreader  [facility]  Office  [phone]  CC: Primary care physician; Patient, No Pcp Per   Note: This dictation was prepared with Dragon dictation along with smaller phrase technology. Any transcriptional errors that result from this process are unin

## 2017-10-16 NOTE — ED Notes (Signed)
Resumed care from valerie rn.  Pt alert   Talking on cell phone. Iv in place.  primedoc in with pt for admission .

## 2017-10-16 NOTE — Progress Notes (Signed)
Pt admitted to room 133 from the ED around 6:30pm. Pt is A&OX4, however extremely anxious. Pt rates pain 10/10 generalized, had received morphine 30 mins prior. Pt having expiratory wheezing, respiratory called to come do albuterol treatment. Prime doc called for anxiety-received order for xanax prn. Pt has rash/blisters generalized over whole body.   Christine Nichols, Christine Nichols

## 2017-10-16 NOTE — ED Provider Notes (Signed)
[facility] Emergency Department Provider Note    ____________________________________________   I have reviewed the triage vital signs and the nursing notes.   HISTORY  Chief Complaint Rash   History limited by: Not Limited   HPI Christine Nichols is a 62 y.o. female who presents to the emergency department today because of concern for worsening pain and rash. Patient was diagnosed with chicken pox a few weeks ago. She states that since that time she has finished two courses of valtrex. Would have minimal improvement on the valtrex however when she finished the course it would come back worse than it had been previously. The patient states the rash has now moved to her soles and palms. Pain is worse on her thighs. Patient denies any fevers. No shortness of breath or cough.    Per medical record review patient has a history of recent diagnoses of chicken pox.   Past Medical History:  Diagnosis Date  . Hypertension     There are no active problems to display for this patient.   Past Surgical History:  Procedure Laterality Date  . ABDOMINAL SURGERY      Prior to Admission medications   Medication Sig Start Date End Date Taking? Authorizing Provider  docusate sodium (COLACE) 100 MG capsule Take 1 tablet once or twice daily as needed for constipation while taking narcotic pain medicine 08/09/17   [name], MD  HYDROcodone-acetaminophen (NORCO/VICODIN) 5-325 MG tablet Take 1-2 tablets by mouth every 6 (six) hours as needed for moderate pain or severe pain. 08/09/17   [name], MD    Allergies Sulfa antibiotics; Codeine; and Penicillins  No family history on file.  Social History Social History   Tobacco Use  . Smoking status: Heavy Tobacco Smoker  . Smokeless tobacco: Never Used  Substance Use Topics  . Alcohol use: Never    Frequency: Never  . Drug use: Never    Review of Systems Constitutional: Positive for generalized weakness.   Eyes: No visual changes. ENT: No sore throat. Cardiovascular: Denies chest pain. Respiratory: Denies shortness of breath. Gastrointestinal: No abdominal pain.  No nausea, no vomiting.  No diarrhea.   Genitourinary: Negative for dysuria. Musculoskeletal: Negative for back pain. Skin: Positive for rash.  Neurological: Negative for headaches, focal weakness or numbness.  ____________________________________________   PHYSICAL EXAM:  VITAL SIGNS: ED Triage Vitals [10/16/17 1243]  Enc Vitals Group     BP (!) 153/93     Pulse Rate 79     Resp 16     Temp 98.1 F (36.7 C)     Temp Source Oral     SpO2 98 %     Weight      Height      Head Circumference      Peak Flow      Pain Score 10   Constitutional: Alert and oriented. Appears uncomfortable.  Eyes: Conjunctivae are normal.  ENT   Head: Normocephalic and atraumatic.   Nose: No congestion/rhinnorhea.   Mouth/Throat: Mucous membranes are moist.   Neck: No stridor. Hematological/Lymphatic/Immunilogical: No cervical lymphadenopathy. Cardiovascular: Normal rate, regular rhythm.  No murmurs, rubs, or gallops.  Respiratory: Normal respiratory effort without tachypnea nor retractions. Breath sounds are clear and equal bilaterally. No wheezes/rales/rhonchi. Gastrointestinal: Soft and non tender. No rebound. No guarding.  Genitourinary: Deferred Musculoskeletal: Normal range of motion in all extremities. No lower extremity edema. Neurologic:  Normal speech and language. No gross focal neurologic deficits are appreciated.  Skin:  Diffuse rash over majority of the skin. Multiple vesicular lesions and lesions in various stages of healing. No obvious secondary infection appreciated. Lesions most numerous and confluent in the thighs.  Psychiatric: Mood and affect are normal. Speech and behavior are normal. Patient exhibits appropriate insight and judgment.  ____________________________________________    LABS (pertinent  positives/negatives)  BMP na 133, k 4.3, cr 0.95 CBC wbc 12.1, hgb 11.5, plt 412  ____________________________________________   EKG  None  ____________________________________________    RADIOLOGY  None  ____________________________________________   PROCEDURES  Procedures  ____________________________________________   INITIAL IMPRESSION / ASSESSMENT AND PLAN / ED COURSE  Pertinent labs & imaging results that were available during my care of the patient were reviewed by me and considered in my medical decision making (see chart for details).  Patient presented to the emergency department today with concerns for worsening rash and pain.  Exam is consistent with chickenpox.  Given the level of the patient's pain and failure to have significant improvement on oral antivirals will plan on admission for IV antivirals and pain control.  Case discussed with hospitalist. Discussed plan with patient.   ____________________________________________   FINAL CLINICAL IMPRESSION(S) / ED DIAGNOSES  Final diagnoses:  Rash     Note: This dictation was prepared with Dragon dictation. Any transcriptional errors that result from this process are unintentional     Nance Pear, MD 10/17/17 1241

## 2017-10-16 NOTE — ED Triage Notes (Signed)
Pt to ED via POV sent from PCP with chicken pox all throughout bodyx few weeks. PT c/o increased weakness and pain. Rash noted to have blisters and open sores. VSS.

## 2017-10-16 NOTE — ED Notes (Signed)
2 computers in room 1.  Both computers are broken.  meds not scanned.  IT called.  Pt waiting on admission.

## 2017-10-16 NOTE — ED Notes (Signed)
Report called to cori rn floor nurse

## 2017-10-17 ENCOUNTER — Other Ambulatory Visit: Payer: Self-pay

## 2017-10-17 LAB — BASIC METABOLIC PANEL
Anion gap: 5 (ref 5–15)
BUN: 11 mg/dL (ref 6–20)
CO2: 26 mmol/L (ref 22–32)
Calcium: 8 mg/dL — ABNORMAL LOW (ref 8.9–10.3)
Chloride: 103 mmol/L (ref 101–111)
Creatinine, Ser: 0.72 mg/dL (ref 0.44–1.00)
GFR calc non Af Amer: >60 mL/min (ref >60)
Glucose, Bld: 104 mg/dL — ABNORMAL HIGH (ref 65–99)
POTASSIUM: 4.1 mmol/L (ref 3.5–5.1)
SODIUM: 134 mmol/L — AB (ref 135–145)

## 2017-10-17 LAB — CBC
HEMATOCRIT: 30.2 % — AB (ref 35.0–47.0)
Hemoglobin: 9.8 g/dL — ABNORMAL LOW (ref 12.0–16.0)
MCH: 25.5 pg — ABNORMAL LOW (ref 26.0–34.0)
MCHC: 32.4 g/dL (ref 32.0–36.0)
MCV: 78.7 fL — ABNORMAL LOW (ref 80.0–100.0)
Platelets: 309 10*3/uL (ref 150–440)
RBC: 3.84 MIL/uL (ref 3.80–5.20)
RDW: 16.1 % — ABNORMAL HIGH (ref 11.5–14.5)
WBC: 5.8 10*3/uL (ref 3.6–11.0)

## 2017-10-17 NOTE — Progress Notes (Signed)
ID E note REviewed chart and labs. She had seen [facility] PCP 4/8 ( over 2 weeks ago) and was dxed with VZV clinically and started valtrex.  Lesions had apparently started 4/5 as a single lesion on back and then spread. She has had no improvement despite valtrex course.  No fevers, wbc on admit 12.  She has hx Hep C untreated.  Rec I have ordered VZV PCR of lesion.  ORdered VZV and hsv Serum PCR, RPR, HIV is pending  Would consult derm as differential is wide for vesicular lesions especially since has been over 2 weeks and min response to valtrex.  Cont IV acyclovir.

## 2017-10-17 NOTE — Progress Notes (Signed)
[place] [facility] [place] NAME: Christine Nichols    MR#:  [id number]  DATE OF BIRTH:  1956-01-12  SUBJECTIVE:  CHIEF COMPLAINT:    REVIEW OF SYSTEMS:  CONSTITUTIONAL: No fever, fatigue or weakness.  EYES: No blurred or double vision.  EARS, NOSE, AND THROAT: No tinnitus or ear pain.  RESPIRATORY: No cough, shortness of breath, wheezing or hemoptysis.  CARDIOVASCULAR: No chest pain, orthopnea, edema.  GASTROINTESTINAL: No nausea, vomiting, diarrhea or abdominal pain.  GENITOURINARY: No dysuria, hematuria.  ENDOCRINE: No polyuria, nocturia,  HEMATOLOGY: No anemia, easy bruising or bleeding SKIN: No rash or lesion. MUSCULOSKELETAL: No joint pain or arthritis.   NEUROLOGIC: No tingling, numbness, weakness.  PSYCHIATRY: No anxiety or depression.   DRUG ALLERGIES:   Allergies  Allergen Reactions  . Sulfa Antibiotics Anaphylaxis  . Codeine Nausea Only  . Penicillins Rash    Has patient had a PCN reaction causing immediate rash, facial/tongue/throat swelling, SOB or lightheadedness with hypotension: Yes Has patient had a PCN reaction causing severe rash involving mucus membranes or skin necrosis: No Has patient had a PCN reaction that required hospitalization: No Has patient had a PCN reaction occurring within the last 10 years: No If all of the above answers are "NO", then may proceed with Cephalosporin use.    VITALS:  Blood pressure (!) 102/56, pulse 86, temperature 98.5 F (36.9 C), temperature source Oral, resp. rate 18, weight 72.6 kg (160 lb), SpO2 96 %.  PHYSICAL EXAMINATION:  GENERAL:  62 y.o.-year-old patient lying in the bed with no acute distress.  EYES: Pupils equal, round, reactive to light and accommodation. No scleral icterus. Extraocular muscles intact.  HEENT: Head atraumatic, normocephalic. Oropharynx and nasopharynx clear.  NECK:  Supple, no jugular venous distention. No thyroid enlargement, no tenderness.  LUNGS:  Normal breath sounds bilaterally, no wheezing, rales,rhonchi or crepitation. No use of accessory muscles of respiration.  CARDIOVASCULAR: S1, S2 normal. No murmurs, rubs, or gallops.  ABDOMEN: Soft, nontender, nondistended. Bowel sounds present. No organomegaly or mass.  EXTREMITIES: No pedal edema, cyanosis, or clubbing.  NEUROLOGIC: Cranial nerves II through XII are intact. Muscle strength 5/5 in all extremities. Sensation intact. Gait not checked.  PSYCHIATRIC: The patient is alert and oriented x 3.  SKIN: No obvious rash, lesion, or ulcer.    LABORATORY PANEL:   CBC Recent Labs  Lab 10/17/17 0551  WBC 5.8  HGB 9.8*  HCT 30.2*  PLT 309   ------------------------------------------------------------------------------------------------------------------  Chemistries  Recent Labs  Lab 10/17/17 0551  NA 134*  K 4.1  CL 103  CO2 26  GLUCOSE 104*  BUN 11  CREATININE 0.72  CALCIUM 8.0*   ------------------------------------------------------------------------------------------------------------------  Cardiac Enzymes No results for input(s): TROPONINI in the last 168 hours. ------------------------------------------------------------------------------------------------------------------  RADIOLOGY:  No results found.  EKG:   Orders placed or performed during the hospital encounter of 08/08/17  . ED EKG within 10 minutes  . ED EKG within 10 minutes  . EKG 12-Lead  . EKG 12-Lead  . EKG    ASSESSMENT AND PLAN:    Severe varicella Clinically stable  Droplet isolation. Continue acyclovir IV every 8 hours.   ID consult.   Pain control as needed  Leukocytosis.  Resolved  Hyponatremia.  Clinically improving with IV fluids sodium at 134  Hypertension.  Continue home hypertensive medication lisinopril  Tobacco abuse.  Smoking cessation was consulted for 4 minutes.  All the records are reviewed and case discussed with ED  provider. Management plans discussed  with the patient, family and they are in agreement.      All the records are reviewed and case discussed with Care Management/Social Workerr. Management plans discussed with the patient, family and they are in agreement.  CODE STATUS: fc   TOTAL TIME TAKING CARE OF THIS PATIENT: 35  minutes.   POSSIBLE D/C IN 2 DAYS, DEPENDING ON CLINICAL CONDITION.  Note: This dictation was prepared with Dragon dictation along with smaller phrase technology. Any transcriptional errors that result from this process are unintentional.   Nicholes Mango M.D on 10/17/2017 at 2:59 PM  Between 7am to 6pm - Pager - [phone] After 6pm go to [email or web address] - password [email or web address] [facility]  Office  [phone]  CC: Primary care physician; Patient, No Pcp Per

## 2017-10-17 NOTE — Plan of Care (Signed)

## 2017-10-18 LAB — HEPATIC FUNCTION PANEL
ALBUMIN: 2.8 g/dL — AB (ref 3.5–5.0)
ALK PHOS: 197 U/L — AB (ref 38–126)
ALT: 41 U/L (ref 14–54)
AST: 51 U/L — AB (ref 15–41)
BILIRUBIN TOTAL: 0.2 mg/dL — AB (ref 0.3–1.2)
Total Protein: 6.4 g/dL — ABNORMAL LOW (ref 6.5–8.1)

## 2017-10-18 LAB — HIV ANTIBODY (ROUTINE TESTING W REFLEX): HIV Screen 4th Generation wRfx: NONREACTIVE

## 2017-10-18 MED ORDER — CEPHALEXIN 500 MG PO CAPS
500.0000 mg | ORAL_CAPSULE | Freq: Two times a day (BID) | ORAL | Status: DC
  Administered 2017-10-18 – 2017-10-21 (×6): 500 mg via ORAL
  Filled 2017-10-18 (×6): qty 1

## 2017-10-18 MED ORDER — DIPHENHYDRAMINE HCL 25 MG PO CAPS
50.0000 mg | ORAL_CAPSULE | Freq: Four times a day (QID) | ORAL | Status: DC | PRN
  Administered 2017-10-18 – 2017-10-23 (×4): 50 mg via ORAL
  Filled 2017-10-18 (×5): qty 2

## 2017-10-18 NOTE — Consult Note (Signed)
Reason for Consult: Rule out lupus  Referring Physician: Hospitalist  Christine Nichols   HPI: 62 year old white female.  Cooks for school.  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  Had to get intermittent steroid and antihistamine shots Hidradenitis.  Had axilla adenopathy excised bilaterally.  Occasionally gets recurrence in axilla and groin  Attempted gastric stapling.  Had complications.  Since then has had 12 surgeries with adhesions and resection of bowel  Chronic hepatitis C.  Never been treated.  Last albumin was low at 2.9.  Elevated alkaline phosphatase.  Decreased total protein.  Elevated viral particles at 2 million  Chronic anemia.  Despite IV iron which she gets regularly she has low ferritin and low iron saturation.  She has had upper endoscopy with gastric ulcer.  She has not had any recent stool guaiacs.  Pending colonoscopy.  No recent abdominal evaluation to rule out varices  Rash: Last summer she developed itching nodules over her back.  Never did blister.  Took antihistamine with improvement. Complained of dry skin in the last year  Had firm lesion in the right hand in September was biopsied.  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  Developed blisters which would rupture 3 weeks ago.  Arms legs and stomach.  Itched.  Was given Valtrex by primary physician 7-day course.  Initially had some fever.  That improved.  She thought she had improvement but after she stopped the Valtrex lesions return.  Received another round of Valtrex.  Mildly improved.  When that finished lesions worsen.  She was seen by her primary physician.  Ordered labs including PCR for varicella and HSV.  Ordered cryoglobulins.  No improvement.  Was admitted.  Lesions have spread to the palms.  One around the right eye but not in the mouth.  She has had some hoarseness.  Some itching.  Lesions are painful.  They have  not been biopsied. Frustrated.  Gets them in her buttocks.  Has to change her clothes. Sister had similar lesions with a diagnosis of lymphomatoid papulosis.  Was treated with dapsone. No prior history of lupus.   It was mentioned by a physician 1 to 2 years ago that may be she had lupus.  No serologies done.  She has not had Raynaud's, photosensitive rashes, alopecia, thrombocytopenia,roteinuria or renal insufficiency.    Recent left shoulder pain.  Fall.  Saw orthopedics.  Thought to be related to rotator cuff.  Hands have not been particularly swollen.  PMH: Chronic hepatitis C untreated.  Recent hyponatremia.  Hypothyroid.  Hypertension.  Depression.  Chronic anemia.  Cigarettes.  SURGICAL HISTORY: 13 abdominal surgeries  Family History: Lymphomatoid papulosis  Social History: Cigarettes.  No significant alcohol  Allergies:  Allergies  Allergen Reactions  . Sulfa Antibiotics Anaphylaxis  . Codeine Nausea Only  . Penicillins Rash    Has patient had a PCN reaction causing immediate rash, facial/tongue/throat swelling, SOB or lightheadedness with hypotension: Yes Has patient had a PCN reaction causing severe rash involving mucus membranes or skin necrosis: No Has patient had a PCN reaction that required hospitalization: No Has patient had a PCN reaction occurring within the last 10 years: No If all of the above answers are "NO", then may proceed with Cephalosporin use.    Medications:  Scheduled: . citalopram  40 mg Oral Daily  . enoxaparin (LOVENOX) injection  40 mg Subcutaneous Q24H  . levothyroxine  75 mcg  Oral QAC breakfast  . lisinopril  30 mg Oral Daily  . nicotine  21 mg Transdermal Daily  . sodium chloride flush  3 mL Intravenous Q12H        ROS: As above.  No abdominal pain no chest pain no weight changes.   PHYSICAL EXAM: Blood pressure 126/84, pulse 83, temperature 98.6 F (37 C), temperature source Oral, resp. rate 18, weight 72.6 kg (160 lb), SpO2 100  %. Pleasant female.  Obvious blistering rash on the abdomen legs hands forearms back thighs.  One blister over the left eyelid.  Oropharynx is clear upper plates.  One lesion on the lip right lower.  Clear chest.  No murmur.  Mild diffuse tenderness of the abdomen.  Did not appreciate any visceromegaly.  No significant edema.  No discoid lesions. Musculoskeletal: No synovitis.  No telangiectasias.  Hips move well.  No hand synovitis.  No knee effusions.  Mild painful arc of motion of the shoulder  Assessment: Widespread blistering illness with  crusting.  Initially some response to Valtrex.  Now on acyclovir without significant improvement. viral versus inflammatory.  Not typical of pemphigus.  Family history of lymphomatoid papulosis.  Doubt vasculitis.  Not typical for SLE  Hepatitis C with high level particles, low albumin and elevated alkaline phosphatase.  Rule out cirrhosis  Significant iron deficiency despite IV iron.  Question chronic blood loss  Status post 13 bowel surgeries with prior resections and adhesions  History of granuloma annulare.  Treated with Kenalog  Remote history of hives    Recommendations: Labs: Sed rate, anti-DNA ds, cryoglobulins, C3, C4. Urinalysis pending. No empiric steroids.  Best to wait on skin biopsy for definitive diagnosis of her blisters Consider tracking down Christine Nichols labs done on 424.  PCR for PSVT and HSV and and titers and cryoglobulins Abdominal ultrasound.  Low albumin.  Hepatitis C.  Question cirrhosis   [name] 10/18/2017, 5:57 PM

## 2017-10-18 NOTE — Consult Note (Signed)
[facility] Infectious Disease     Reason for Consult  Skin lesions    Referring Physician: Gouru, A Date of Admission:  10/16/2017   Active Problems:   Varicella   HPI: Christine Nichols is a 62 y.o. female admitted with a rash ongoing for over a month, vesicular in nature. She has had skin lesions now she reports since January. Worsening over last month. No new medicine but has been using new lotion since December.   She had seen [facility] PCP 4/8 ( over 2 weeks ago)  was dxed with VZV clinically and started valtrex.  Lesions had apparently started 4/5 as a single lesion on back and then spread. She had some improvement but then worsened when stopped the valtrex course.  No fevers, wbc on admit 12.  She has hx Hep C untreated.   Past Medical History:  Diagnosis Date  . Hypertension    Past Surgical History:  Procedure Laterality Date  . ABDOMINAL SURGERY     Social History   Tobacco Use  . Smoking status: Heavy Tobacco Smoker  . Smokeless tobacco: Never Used  Substance Use Topics  . Alcohol use: Never    Frequency: Never  . Drug use: Never   No family history on file.  Allergies:  Allergies  Allergen Reactions  . Sulfa Antibiotics Anaphylaxis  . Codeine Nausea Only  . Penicillins Rash    Has patient had a PCN reaction causing immediate rash, facial/tongue/throat swelling, SOB or lightheadedness with hypotension: Yes Has patient had a PCN reaction causing severe rash involving mucus membranes or skin necrosis: No Has patient had a PCN reaction that required hospitalization: No Has patient had a PCN reaction occurring within the last 10 years: No If all of the above answers are "NO", then may proceed with Cephalosporin use.    Current antibiotics: Antibiotics Given (last 72 hours)    Date/Time Action Medication Dose Rate   10/16/17 1703 New Bag/Given   acyclovir (ZOVIRAX) 725 mg in dextrose 5 % 150 mL IVPB 725 mg 164.5 mL/hr   10/16/17 2224 New Bag/Given   acyclovir  (ZOVIRAX) 725 mg in dextrose 5 % 150 mL IVPB 725 mg 164.5 mL/hr   10/17/17 0448 New Bag/Given   acyclovir (ZOVIRAX) 725 mg in dextrose 5 % 150 mL IVPB 725 mg 164.5 mL/hr   10/17/17 1441 New Bag/Given   acyclovir (ZOVIRAX) 725 mg in dextrose 5 % 150 mL IVPB 725 mg 164.5 mL/hr   10/17/17 2138 New Bag/Given   acyclovir (ZOVIRAX) 725 mg in dextrose 5 % 150 mL IVPB 725 mg 164.5 mL/hr   10/18/17 0457 New Bag/Given   acyclovir (ZOVIRAX) 725 mg in dextrose 5 % 150 mL IVPB 725 mg 164.5 mL/hr   10/18/17 1524 New Bag/Given   acyclovir (ZOVIRAX) 725 mg in dextrose 5 % 150 mL IVPB 725 mg 164.5 mL/hr      MEDICATIONS: . citalopram  40 mg Oral Daily  . enoxaparin (LOVENOX) injection  40 mg Subcutaneous Q24H  . levothyroxine  75 mcg Oral QAC breakfast  . lisinopril  30 mg Oral Daily  . nicotine  21 mg Transdermal Daily  . sodium chloride flush  3 mL Intravenous Q12H    Review of Systems - 11 systems reviewed and negative per HPI   OBJECTIVE: Temp:  [98.6 F (37 C)-98.9 F (37.2 C)] 98.6 F (37 C) (04/26 1712) Pulse Rate:  [83-85] 83 (04/26 1712) Resp:  [18] 18 (04/26 1712) BP: (112-132)/(71-93) 126/84 (04/26 1712)  SpO2:  [96 %-100 %] 100 % (04/26 1712) Physical Exam  Constitutional:  oriented to person, place, and time. appears well-developed and well-nourished. No distress.  HENT: [place]/AT, PERRLA, no scleral icterus Mouth/Throat: Oropharynx is clear and moist. No oropharyngeal exudate.  Cardiovascular: Normal rate, regular rhythm and normal heart sounds. .  Pulmonary/Chest: mild exp wheeze Neck = supple, no nuchal rigidity Abdominal: Soft. Bowel sounds are normal.  exhibits no distension. There is no tenderness.  Lymphadenopathy: no cervical adenopathy. No axillary adenopathy Neurological: alert and oriented to person, place, and time.  Skin: diffuse rash on back, abd, arms and legs. Some on palms.  Only mild on face. No oral lesions. The lesions have some vesicles, many denuded lesions,  scabbing  Psychiatric: a normal mood and affect.  behavior is normal.    LABS: Results for orders placed or performed during the hospital encounter of 10/16/17 (from the past 48 hour(s))  HIV antibody (Routine Testing)     Status: None   Collection Time: 10/17/17  5:51 AM  Result Value Ref Range   HIV Screen 4th Generation wRfx Non Reactive Non Reactive    Comment: (NOTE) Performed At: [facility] [place], [place] [id number] Rush Farmer MD [phone] Performed at [facility], [address] [place], [place] [address]   Basic metabolic panel     Status: Abnormal   Collection Time: 10/17/17  5:51 AM  Result Value Ref Range   Sodium 134 (L) 135 - 145 mmol/L   Potassium 4.1 3.5 - 5.1 mmol/L   Chloride 103 101 - 111 mmol/L   CO2 26 22 - 32 mmol/L   Glucose, Bld 104 (H) 65 - 99 mg/dL   BUN 11 6 - 20 mg/dL   Creatinine, Ser 0.72 0.44 - 1.00 mg/dL   Calcium 8.0 (L) 8.9 - 10.3 mg/dL   GFR calc non Af Amer >60 >60 mL/min   GFR calc Af Amer >60 >60 mL/min    Comment: (NOTE) The eGFR has been calculated using the CKD EPI equation. This calculation has not been validated in all clinical situations. eGFR's persistently <60 mL/min signify possible Chronic Kidney Disease.    Anion gap 5 5 - 15    Comment: Performed at [facility], [address] [place], [place] [address]  CBC     Status: Abnormal   Collection Time: 10/17/17  5:51 AM  Result Value Ref Range   WBC 5.8 3.6 - 11.0 K/uL   RBC 3.84 3.80 - 5.20 MIL/uL   Hemoglobin 9.8 (L) 12.0 - 16.0 g/dL   HCT 30.2 (L) 35.0 - 47.0 %   MCV 78.7 (L) 80.0 - 100.0 fL   MCH 25.5 (L) 26.0 - 34.0 pg   MCHC 32.4 32.0 - 36.0 g/dL   RDW 16.1 (H) 11.5 - 14.5 %   Platelets 309 150 - 440 K/uL    Comment: Performed at [facility], [address] [place], [place] [address]  Hepatic function panel     Status: Abnormal   Collection Time: 10/18/17  4:10 AM  Result Value Ref Range    Total Protein 6.4 (L) 6.5 - 8.1 g/dL   Albumin 2.8 (L) 3.5 - 5.0 g/dL   AST 51 (H) 15 - 41 U/L   ALT 41 14 - 54 U/L   Alkaline Phosphatase 197 (H) 38 - 126 U/L   Total Bilirubin 0.2 (L) 0.3 - 1.2 mg/dL   Bilirubin, Direct <0.1 (L) 0.1 - 0.5 mg/dL  Indirect Bilirubin NOT CALCULATED 0.3 - 0.9 mg/dL    Comment: Performed at [facility], [address] [place], [place] [address]   No components found for: ESR, C REACTIVE PROTEIN MICRO: No results found for this or any previous visit (from the past 720 hour(s)).  IMAGING: No results found.  Assessment:   Christine Nichols is a 62 y.o. female with a diffuse rash with papulovesicular lesions on abd back legs arms, sparing mucosal membranes. She has Hep C untreated.  HIV negative. LFTs mildly elevated.  I think it is unlikely she has varicella but would favor autoimmune bullous disease.  Doubt staph scalded skin syndrome but could be possible as it is usually associated with Staph colonization as opposed to systemic infection.  Will need biopsy. I have ordered VZV PCR on lesion and on blood.   Recommendations Await bxp  Could use clobetasol ointment over the weekend pending derm eval.  Will start keflex 500 bid in case of scalded skin syndrome.  Thank you very much for allowing me to participate in the care of this patient. Please call with questions.   Cheral Marker. Ola Spurr, MD

## 2017-10-18 NOTE — Plan of Care (Signed)
Varicella PCR collected by 3rd shift RN and sent to lab. Lab contacted and confirmed they received completed PCR and sent sample out (this AM) to be run/completed.

## 2017-10-18 NOTE — Progress Notes (Signed)
[place] at [place] NAME: Christine Nichols    MR#:  [id number]  DATE OF BIRTH:  06-02-1956  SUBJECTIVE:  CHIEF COMPLAINT: Patient reports that she is recently diagnosed with lupus 3 4 months ago but not on any medications , also reports some skin disorder named papulosis runs in her family.  Her rash started on April 5 as a single lesion on the back and then spread diffusely all over her body , no significant improvement with Valtrex course .dermatology consulted but they cannot see the patient until Monday  REVIEW OF SYSTEMS:  CONSTITUTIONAL: No fever, fatigue or weakness.  EYES: No blurred or double vision.  EARS, NOSE, AND THROAT: No tinnitus or ear pain.  RESPIRATORY: No cough, shortness of breath, wheezing or hemoptysis.  CARDIOVASCULAR: No chest pain, orthopnea, edema.  GASTROINTESTINAL: No nausea, vomiting, diarrhea or abdominal pain.  GENITOURINARY: No dysuria, hematuria.  ENDOCRINE: No polyuria, nocturia,  HEMATOLOGY: No anemia, easy bruising or bleeding SKIN: Rash on the skin diffusely present MUSCULOSKELETAL: No joint pain or arthritis.   NEUROLOGIC: No tingling, numbness, weakness.  PSYCHIATRY: No anxiety or depression.   DRUG ALLERGIES:   Allergies  Allergen Reactions  . Sulfa Antibiotics Anaphylaxis  . Codeine Nausea Only  . Penicillins Rash    Has patient had a PCN reaction causing immediate rash, facial/tongue/throat swelling, SOB or lightheadedness with hypotension: Yes Has patient had a PCN reaction causing severe rash involving mucus membranes or skin necrosis: No Has patient had a PCN reaction that required hospitalization: No Has patient had a PCN reaction occurring within the last 10 years: No If all of the above answers are "NO", then may proceed with Cephalosporin use.    VITALS:  Blood pressure 116/82, pulse 85, temperature 98.6 F (37 C), temperature source Oral, resp. rate 18, weight 72.6 kg (160 lb),  SpO2 96 %.  PHYSICAL EXAMINATION:  GENERAL:  62 y.o.-year-old patient lying in the bed with no acute distress.  EYES: Pupils equal, round, reactive to light and accommodation. No scleral icterus. Extraocular muscles intact.  HEENT: Head atraumatic, normocephalic. Oropharynx and nasopharynx clear.  NECK:  Supple, no jugular venous distention. No thyroid enlargement, no tenderness.  LUNGS: Normal breath sounds bilaterally, no wheezing, rales,rhonchi or crepitation. No use of accessory muscles of respiration.  CARDIOVASCULAR: S1, S2 normal. No murmurs, rubs, or gallops.  ABDOMEN: Soft, nontender, nondistended. Bowel sounds present. No organomegaly or mass.  EXTREMITIES: No pedal edema, cyanosis, or clubbing.  NEUROLOGIC: Cranial nerves II through XII are intact. Muscle strength 5/5 in all extremities. Sensation intact. Gait not checked.  PSYCHIATRIC: The patient is alert and oriented x 3.  SKIN: Diffuse maculopapular rash with encrustations on the extremities back and torso, face is spared   LABORATORY PANEL:   CBC Recent Labs  Lab 10/17/17 0551  WBC 5.8  HGB 9.8*  HCT 30.2*  PLT 309   ------------------------------------------------------------------------------------------------------------------  Chemistries  Recent Labs  Lab 10/17/17 0551 10/18/17 0410  NA 134*  --   K 4.1  --   CL 103  --   CO2 26  --   GLUCOSE 104*  --   BUN 11  --   CREATININE 0.72  --   CALCIUM 8.0*  --   AST  --  51*  ALT  --  41  ALKPHOS  --  197*  BILITOT  --  0.2*   ------------------------------------------------------------------------------------------------------------------  Cardiac Enzymes No results for input(s): TROPONINI in the last  168 hours. ------------------------------------------------------------------------------------------------------------------  RADIOLOGY:  No results found.  EKG:   Orders placed or performed during the hospital encounter of 08/08/17  . ED EKG  within 10 minutes  . ED EKG within 10 minutes  . EKG 12-Lead  . EKG 12-Lead  . EKG    ASSESSMENT AND PLAN:    Severe maculopapular skin lesion-unclear etiology  differential  diagnosis varicella, autoimmune blistering disease, discoid lupus Clinically stable  Droplet isolation. Continue acyclovir IV every 8 hours.  Patient clinically did not improve with Valtrex course Rheumatology consult placed given the recent history of lupus, discussed with Dr. Jefm Bryant Check urine for random protein and creatinine   ID  recommendations appreciated.   Dermatology consulted ,unavailable to see see the patient until Monday  Varicella-zoster PCR, herpes PCR, RPR ordered  Pain control as needed  Leukocytosis.  Resolved  Hyponatremia.  Clinically improving with IV fluids sodium at 134  Hypertension.  Continue home hypertensive medication lisinopril  Tobacco abuse.  Smoking cessation was consulted for 4 minutes.  All the records are reviewed and case discussed with ED provider. Management plans discussed with the patient, family and they are in agreement.      All the records are reviewed and case discussed with Care Management/Social Workerr. Management plans discussed with the patient, family and they are in agreement.  CODE STATUS: fc   TOTAL TIME TAKING CARE OF THIS PATIENT: 35  minutes.   POSSIBLE D/C IN 2 DAYS, DEPENDING ON CLINICAL CONDITION.  Note: This dictation was prepared with Dragon dictation along with smaller phrase technology. Any transcriptional errors that result from this process are unintentional.   Nicholes Mango M.D on 10/18/2017 at 3:48 PM  Between 7am to 6pm - Pager - [phone] After 6pm go to [email or web address] - password [email or web address] [facility]  Office  [phone]  CC: Primary care physician; Patient, No Pcp Per

## 2017-10-18 NOTE — Plan of Care (Signed)
ID notified that dermatology does not have anyone on-call for today and would not be able to round till Monday 4/29

## 2017-10-19 LAB — COMPREHENSIVE METABOLIC PANEL
ALBUMIN: 2.7 g/dL — AB (ref 3.5–5.0)
ALK PHOS: 164 U/L — AB (ref 38–126)
ALT: 32 U/L (ref 14–54)
AST: 38 U/L (ref 15–41)
Anion gap: 5 (ref 5–15)
BUN: 13 mg/dL (ref 6–20)
CHLORIDE: 102 mmol/L (ref 101–111)
CO2: 27 mmol/L (ref 22–32)
Calcium: 8.3 mg/dL — ABNORMAL LOW (ref 8.9–10.3)
Creatinine, Ser: 0.81 mg/dL (ref 0.44–1.00)
GFR calc Af Amer: >60 mL/min (ref >60)
GFR calc non Af Amer: >60 mL/min (ref >60)
GLUCOSE: 112 mg/dL — AB (ref 65–99)
Potassium: 4.6 mmol/L (ref 3.5–5.1)
Sodium: 134 mmol/L — ABNORMAL LOW (ref 135–145)
Total Bilirubin: 0.3 mg/dL (ref 0.3–1.2)
Total Protein: 6.3 g/dL — ABNORMAL LOW (ref 6.5–8.1)

## 2017-10-19 LAB — CBC
HCT: 33.1 % — ABNORMAL LOW (ref 35.0–47.0)
Hemoglobin: 10.5 g/dL — ABNORMAL LOW (ref 12.0–16.0)
MCH: 24.8 pg — ABNORMAL LOW (ref 26.0–34.0)
MCHC: 31.7 g/dL — ABNORMAL LOW (ref 32.0–36.0)
MCV: 78.3 fL — ABNORMAL LOW (ref 80.0–100.0)
Platelets: 340 10*3/uL (ref 150–440)
RBC: 4.22 MIL/uL (ref 3.80–5.20)
RDW: 15.9 % — ABNORMAL HIGH (ref 11.5–14.5)
WBC: 8.2 10*3/uL (ref 3.6–11.0)

## 2017-10-19 LAB — CREATININE, URINE, RANDOM: CREATININE, URINE: 92 mg/dL

## 2017-10-19 LAB — RPR: RPR: NONREACTIVE

## 2017-10-19 LAB — PROTEIN, URINE, RANDOM: TOTAL PROTEIN, URINE: 10 mg/dL

## 2017-10-19 MED ORDER — CLOBETASOL PROPIONATE 0.05 % EX CREA
TOPICAL_CREAM | Freq: Two times a day (BID) | CUTANEOUS | Status: DC
  Administered 2017-10-19 – 2017-10-24 (×10): via TOPICAL
  Filled 2017-10-19 (×3): qty 15

## 2017-10-19 NOTE — Progress Notes (Signed)
[place] at [place] NAME: Christine Nichols    MR#:  [id number]  DATE OF BIRTH:  07/27/1955  SUBJECTIVE: Says that her rash.  More in the legs and also small ulcer noted in the mouth.  Complains of burning sensation all over the body.  CHIEF COMPLAINT:   REVIEW OF SYSTEMS:  CONSTITUTIONAL: No fever, fatigue or weakness.  EYES: No blurred or double vision.  EARS, NOSE, AND THROAT: No tinnitus or ear pain.  RESPIRATORY: No cough, shortness of breath, wheezing or hemoptysis.  CARDIOVASCULAR: No chest pain, orthopnea, edema.  GASTROINTESTINAL: No nausea, vomiting, diarrhea or abdominal pain.  GENITOURINARY: No dysuria, hematuria.  ENDOCRINE: No polyuria, nocturia,  HEMATOLOGY: No anemia, easy bruising or bleeding SKIN:Diffuse skin rash  MUSCULOSKELETAL: No joint pain or arthritis.   NEUROLOGIC: No tingling, numbness, weakness.  PSYCHIATRY: No anxiety or depression.   DRUG ALLERGIES:   Allergies  Allergen Reactions  . Sulfa Antibiotics Anaphylaxis  . Codeine Nausea Only  . Penicillins Rash    Has patient had a PCN reaction causing immediate rash, facial/tongue/throat swelling, SOB or lightheadedness with hypotension: Yes Has patient had a PCN reaction causing severe rash involving mucus membranes or skin necrosis: No Has patient had a PCN reaction that required hospitalization: No Has patient had a PCN reaction occurring within the last 10 years: No If all of the above answers are "NO", then may proceed with Cephalosporin use.    VITALS:  Blood pressure 133/90, pulse 83, temperature 98.1 F (36.7 C), temperature source Oral, resp. rate 18, weight 72.6 kg (160 lb), SpO2 95 %.  PHYSICAL EXAMINATION:  GENERAL:  62 y.o.-year-old patient lying in the bed with no acute distress.  EYES: Pupils equal, round, reactive to light and accommodation. No scleral icterus. Extraocular muscles intact.  HEENT: Head atraumatic, normocephalic.  Oropharynx and nasopharynx clear.  NECK:  Supple, no jugular venous distention. No thyroid enlargement, no tenderness.  LUNGS: Normal breath sounds bilaterally, no wheezing, rales,rhonchi or crepitation. No use of accessory muscles of respiration.  CARDIOVASCULAR: S1, S2 normal. No murmurs, rubs, or gallops.  ABDOMEN: Soft, nontender, nondistended. Bowel sounds present. No organomegaly or mass.  EXTREMITIES: No pedal edema, cyanosis, or clubbing.  NEUROLOGIC: Cranial nerves II through XII are intact. Muscle strength 5/5 in all extremities. Sensation intact. Gait not checked.  PSYCHIATRIC: The patient is alert and oriented x 3.  SKIN: Noted to have diffuse rash some more vesicles some are scabbing type sparing face and sole.  Patient noted to have some rash on palms.  She told me that patient started as vesicles.  LABORATORY PANEL:   CBC Recent Labs  Lab 10/19/17 0600  WBC 8.2  HGB 10.5*  HCT 33.1*  PLT 340   ------------------------------------------------------------------------------------------------------------------  Chemistries  Recent Labs  Lab 10/19/17 0600  NA 134*  K 4.6  CL 102  CO2 27  GLUCOSE 112*  BUN 13  CREATININE 0.81  CALCIUM 8.3*  AST 38  ALT 32  ALKPHOS 164*  BILITOT 0.3   ------------------------------------------------------------------------------------------------------------------  Cardiac Enzymes No results for input(s): TROPONINI in the last 168 hours. ------------------------------------------------------------------------------------------------------------------  RADIOLOGY:  No results found.  EKG:   Orders placed or performed during the hospital encounter of 08/08/17  . ED EKG within 10 minutes  . ED EKG within 10 minutes  . EKG 12-Lead  . EKG 12-Lead  . EKG    ASSESSMENT AND PLAN:  Diffuse papular vesicular rash, sparing mucous membrane, sole likely,  patient on IV acyclovir for possible chickenpox, seen by Dr. Ola Spurr and  also Dr. Jefm Bryant, started on Keflex, clobetasol, work-up ordered for autoimmune diseases.  Dermatology consult placed for skin biopsy and diagnosis of the skin lesions.  Droplet precautions, patient clinically stable. Dr. Ola Spurr recommended continuing Keflex 500 mg twice daily and also use clobetasol ointment over the weekend ending dermatology evaluation    Dermatology consulted ,unavailable to see see the patient until Monday  Varicella-zoster PCR, herpes PCR, RPR ordered Patient on IV acyclovir.  Leukocytosis.  Resolved  Hyponatremia.  Clinically improving with IV fluids sodium at 134  Hypertension.  Continue home hypertensive medication lisinopril  Tobacco abuse.  Smoking cessation was consulted for 4 minutes.   All the records are reviewed and case discussed with ED provider. Management plans discussed with the patient, family and they are in agreement.      All the records are reviewed and case discussed with Care Management/Social Workerr. Management plans discussed with the patient, family and they are in agreement.  CODE STATUS: fc   TOTAL TIME TAKING CARE OF THIS PATIENT: 35  minutes.   POSSIBLE D/C IN 2 DAYS, DEPENDING ON CLINICAL CONDITION.  Note: This dictation was prepared with Dragon dictation along with smaller phrase technology. Any transcriptional errors that result from this process are unintentional.   Epifanio Lesches M.D on 10/19/2017 at 9:24 AM  Between 7am to 6pm - Pager - [phone] After 6pm go to [email or web address] - password [email or web address] [facility]  Office  [phone]  CC: Primary care physician; Patient, No Pcp Per

## 2017-10-20 LAB — HERPES SIMPLEX VIRUS(HSV) DNA BY PCR
HSV 1 DNA: NEGATIVE
HSV 2 DNA: NEGATIVE

## 2017-10-20 MED ORDER — BISACODYL 5 MG PO TBEC
5.0000 mg | DELAYED_RELEASE_TABLET | Freq: Every day | ORAL | Status: DC | PRN
  Administered 2017-10-21: 5 mg via ORAL
  Filled 2017-10-20: qty 1

## 2017-10-20 MED ORDER — HYDROXYZINE HCL 10 MG PO TABS
10.0000 mg | ORAL_TABLET | Freq: Three times a day (TID) | ORAL | Status: DC | PRN
  Administered 2017-10-20: 10 mg via ORAL
  Filled 2017-10-20 (×2): qty 1

## 2017-10-20 NOTE — Progress Notes (Signed)
[place] at [place] NAME: Christine Nichols    MR#:  [id number]  DATE OF BIRTH:  1955/11/20  SUBJECTIVE; complains of constipation, itching all over the body.  Requesting Benadryl or Atarax for itching.  Noted to have a new blister on the third finger on the left hand.  But rash on the thighs is getting drier than before.  Is getting new blisters.  CHIEF COMPLAINT:   REVIEW OF SYSTEMS:  CONSTITUTIONAL: No fever, fatigue or weakness.  EYES: No blurred or double vision.  EARS, NOSE, AND THROAT: No tinnitus or ear pain.  RESPIRATORY: No cough, shortness of breath, wheezing or hemoptysis.  CARDIOVASCULAR: No chest pain, orthopnea, edema.  GASTROINTESTINAL: No nausea, vomiting, diarrhea or abdominal pain.  GENITOURINARY: No dysuria, hematuria.  ENDOCRINE: No polyuria, nocturia,  HEMATOLOGY: No anemia, easy bruising or bleeding SKIN:Diffuse skin rash, complains of itching today. MUSCULOSKELETAL: No joint pain or arthritis.   NEUROLOGIC: No tingling, numbness, weakness.  PSYCHIATRY: No anxiety or depression.   DRUG ALLERGIES:   Allergies  Allergen Reactions  . Sulfa Antibiotics Anaphylaxis  . Codeine Nausea Only  . Penicillins Rash    Has patient had a PCN reaction causing immediate rash, facial/tongue/throat swelling, SOB or lightheadedness with hypotension: Yes Has patient had a PCN reaction causing severe rash involving mucus membranes or skin necrosis: No Has patient had a PCN reaction that required hospitalization: No Has patient had a PCN reaction occurring within the last 10 years: No If all of the above answers are "NO", then may proceed with Cephalosporin use.    VITALS:  Blood pressure 139/81, pulse 79, temperature 98.7 F (37.1 C), resp. rate 18, weight 72.6 kg (160 lb), SpO2 95 %.  PHYSICAL EXAMINATION:  GENERAL:  62 y.o.-year-old patient lying in the bed with no acute distress.  EYES: Pupils equal, round, reactive to  light and accommodation. No scleral icterus. Extraocular muscles intact.  HEENT: Head atraumatic, normocephalic. Oropharynx and nasopharynx clear.  NECK:  Supple, no jugular venous distention. No thyroid enlargement, no tenderness.  LUNGS: Normal breath sounds bilaterally, no wheezing, rales,rhonchi or crepitation. No use of accessory muscles of respiration.  CARDIOVASCULAR: S1, S2 normal. No murmurs, rubs, or gallops.  ABDOMEN: Soft, nontender, nondistended. Bowel sounds present. No organomegaly or mass.  EXTREMITIES: No pedal edema, cyanosis, or clubbing.  NEUROLOGIC: Cranial nerves II through XII are intact. Muscle strength 5/5 in all extremities. Sensation intact. Gait not checked.  PSYCHIATRIC: The patient is alert and oriented x 3.  SKIN: Noted to have diffuse rash some more vesicles some are scabbing type sparing face and sole.  Noted to have no blister on the left hand middle finger.  The rash on thighs is getting crusted and dry.  LABORATORY PANEL:   CBC Recent Labs  Lab 10/19/17 0600  WBC 8.2  HGB 10.5*  HCT 33.1*  PLT 340   ------------------------------------------------------------------------------------------------------------------  Chemistries  Recent Labs  Lab 10/19/17 0600  NA 134*  K 4.6  CL 102  CO2 27  GLUCOSE 112*  BUN 13  CREATININE 0.81  CALCIUM 8.3*  AST 38  ALT 32  ALKPHOS 164*  BILITOT 0.3   ------------------------------------------------------------------------------------------------------------------  Cardiac Enzymes No results for input(s): TROPONINI in the last 168 hours. ------------------------------------------------------------------------------------------------------------------  RADIOLOGY:  No results found.  EKG:   Orders placed or performed during the hospital encounter of 08/08/17  . ED EKG within 10 minutes  . ED EKG within 10 minutes  . EKG  12-Lead  . EKG 12-Lead  . EKG    ASSESSMENT AND PLAN:  Diffuse papular  vesicular rash, sparing mucous membrane, sole likely, patient on IV acyclovir for possible chickenpox, seen by Dr. Ola Spurr and also Dr. Jefm Bryant, started on Keflex, clobetasol, work-up ordered for autoimmune diseases.  Dermatology consult placed for skin biopsy and diagnosis of the skin lesions.  Droplet /airborne precautions, patient clinically stable. Dr. Ola Spurr recommended continuing Keflex 500 mg twice daily and also use clobetasol ointment .  Patient on IV acyclovir.  Varicella PCR is pending.  Patient has diffuse rash and now has itching so I started her on Atarax today.  Noted to have itching since today , she has had she did have itching yesterday.  Leukocytosis.  Resolved  Hyponatremia.  Clinically improving with IV fluids sodium at 134  Hypertension.  Continue home hypertensive medication lisinopril  Tobacco abuse.  Smoking cessation was consulted for 4 minutes. Constipation: Use stool softeners.  All the records are reviewed and case discussed with ED provider. Management plans discussed with the patient, family and they are in agreement.      All the records are reviewed and case discussed with Care Management/Social Workerr. Management plans discussed with the patient, family and they are in agreement.  CODE STATUS: fc   TOTAL TIME TAKING CARE OF THIS PATIENT: 35  minutes.   POSSIBLE D/C IN 2 DAYS, DEPENDING ON CLINICAL CONDITION.  Note: This dictation was prepared with Dragon dictation along with smaller phrase technology. Any transcriptional errors that result from this process are unintentional.   Epifanio Lesches M.D on 10/20/2017 at 9:05 AM  Between 7am to 6pm - Pager - [phone] After 6pm go to [email or web address] - password [email or web address]  Office  [phone]  CC: Primary care physician; Patient, No Pcp Per

## 2017-10-21 DIAGNOSIS — B182 Chronic viral hepatitis C: Secondary | ICD-10-CM

## 2017-10-21 LAB — SEDIMENTATION RATE: SED RATE: 35 mm/h — AB (ref 0–30)

## 2017-10-21 MED ORDER — PREDNISONE 20 MG PO TABS
60.0000 mg | ORAL_TABLET | Freq: Every day | ORAL | Status: DC
  Administered 2017-10-21 – 2017-10-24 (×4): 60 mg via ORAL
  Filled 2017-10-21 (×4): qty 3

## 2017-10-21 MED ORDER — HYDROXYZINE HCL 25 MG PO TABS
25.0000 mg | ORAL_TABLET | Freq: Three times a day (TID) | ORAL | Status: DC | PRN
Start: 2017-10-21 — End: 2017-10-24
  Administered 2017-10-21 – 2017-10-22 (×2): 25 mg via ORAL
  Filled 2017-10-21 (×3): qty 1

## 2017-10-21 MED ORDER — FAMOTIDINE 20 MG PO TABS
20.0000 mg | ORAL_TABLET | Freq: Every day | ORAL | Status: DC
  Administered 2017-10-21 – 2017-10-24 (×4): 20 mg via ORAL
  Filled 2017-10-21 (×4): qty 1

## 2017-10-21 MED ORDER — FAMOTIDINE IN NACL 20-0.9 MG/50ML-% IV SOLN
20.0000 mg | Freq: Two times a day (BID) | INTRAVENOUS | Status: DC
  Administered 2017-10-21: 20 mg via INTRAVENOUS

## 2017-10-21 MED ORDER — LACTULOSE 10 GM/15ML PO SOLN
30.0000 g | Freq: Once | ORAL | Status: DC
  Filled 2017-10-21: qty 60

## 2017-10-21 MED ORDER — PREDNISONE 50 MG PO TABS: 60.0000 mg | ORAL_TABLET | Freq: Every day | ORAL | Status: DC

## 2017-10-21 NOTE — Progress Notes (Addendum)
[place] at [place] NAME: Christine Nichols    MR#:  [id number]  DATE OF BIRTH:  07-30-1955  Very upset today and crying because her rash is not getting better.  New blisters popping up, old rash on thighs is healing but she is also getting new blisters.  Sense of itching especially in the legs and also diffuse body aches and joint pains.  ESR slightly up at 35.  CHIEF COMPLAINT:   REVIEW OF SYSTEMS:  CONSTITUTIONAL: No fever, fatigue or weakness.  EYES: No blurred or double vision.  EARS, NOSE, AND THROAT: No tinnitus or ear pain.  RESPIRATORY: No cough, shortness of breath, wheezing or hemoptysis.  CARDIOVASCULAR: No chest pain, orthopnea, edema.  GASTROINTESTINAL: No nausea, vomiting, diarrhea or abdominal pain.  GENITOURINARY: No dysuria, hematuria.  ENDOCRINE: No polyuria, nocturia,  HEMATOLOGY: No anemia, easy bruising or bleeding SKIN:Diffuse skin rash, complains of itching today. MUSCULOSKELETAL: No joint pain or arthritis.   NEUROLOGIC: No tingling, numbness, weakness.  PSYCHIATRY: No anxiety or depression.   DRUG ALLERGIES:   Allergies  Allergen Reactions  . Sulfa Antibiotics Anaphylaxis  . Codeine Nausea Only  . Penicillins Rash    Has patient had a PCN reaction causing immediate rash, facial/tongue/throat swelling, SOB or lightheadedness with hypotension: Yes Has patient had a PCN reaction causing severe rash involving mucus membranes or skin necrosis: No Has patient had a PCN reaction that required hospitalization: No Has patient had a PCN reaction occurring within the last 10 years: No If all of the above answers are "NO", then may proceed with Cephalosporin use.    VITALS:  Blood pressure 132/78, pulse 82, temperature 98.6 F (37 C), temperature source Oral, resp. rate 18, weight 72.6 kg (160 lb), SpO2 94 %.  PHYSICAL EXAMINATION:  GENERAL:  62 y.o.-year-old patient lying in the bed with no acute distress.   EYES: Pupils equal, round, reactive to light and accommodation. No scleral icterus. Extraocular muscles intact.  HEENT: Head atraumatic, normocephalic. Oropharynx and nasopharynx clear.  NECK:  Supple, no jugular venous distention. No thyroid enlargement, no tenderness.  LUNGS: Normal breath sounds bilaterally, no wheezing, rales,rhonchi or crepitation. No use of accessory muscles of respiration.  CARDIOVASCULAR: S1, S2 normal. No murmurs, rubs, or gallops.  ABDOMEN: Soft, nontender, nondistended. Bowel sounds present. No organomegaly or mass.  EXTREMITIES: No pedal edema, cyanosis, or clubbing.  NEUROLOGIC: Cranial nerves II through XII are intact. Muscle strength 5/5 in all extremities. Sensation intact. Gait not checked.  PSYCHIATRIC: The patient is alert and oriented x 3.  SKIN: Noted to have diffuse rash some more vesicles some are scabbing type sparing face and sole.  He had a big blister on the palm of right hand and she popped it last night.Marland Kitchen  LABORATORY PANEL:   CBC Recent Labs  Lab 10/19/17 0600  WBC 8.2  HGB 10.5*  HCT 33.1*  PLT 340   ------------------------------------------------------------------------------------------------------------------  Chemistries  Recent Labs  Lab 10/19/17 0600  NA 134*  K 4.6  CL 102  CO2 27  GLUCOSE 112*  BUN 13  CREATININE 0.81  CALCIUM 8.3*  AST 38  ALT 32  ALKPHOS 164*  BILITOT 0.3   ------------------------------------------------------------------------------------------------------------------  Cardiac Enzymes No results for input(s): TROPONINI in the last 168 hours. ------------------------------------------------------------------------------------------------------------------  RADIOLOGY:  No results found.  EKG:   Orders placed or performed during the hospital encounter of 08/08/17  . ED EKG within 10 minutes  . ED EKG within 10  minutes  . EKG 12-Lead  . EKG 12-Lead  . EKG    ASSESSMENT AND PLAN:   Diffuse papular vesicular rash, sparing mucous membrane, sole likely, patient on IV acyclovir for possible chickenpox, seen by Dr. Ola Spurr and also Dr. Jefm Bryant, started on Keflex, clobetasol, work-up ordered for autoimmune diseases.  Dermatology consult placed for skin biopsy and diagnosis of the skin lesions.  Droplet /airborne precautions, patient clinically stable.  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  PR nonreactive Dr. Ola Spurr recommended continuing Keflex 500 mg twice daily and also use clobetasol ointment .  Patient on IV acyclovir.  Varicella PCR is pending.  Patient has diffuse rash and now has itching so I started her on Atarax  / Leukocytosis.  Resolved  Hyponatremia.  Clinically improving with IV fluids sodium at 134  Hypertension.  Continue home hypertensive medication lisinopril  Tobacco abuse.  Smoking cessation was consulted for 4 minutes. Constipation: Use stool softeners.  All the records are reviewed and case discussed with ED provider. Management plans discussed with the patient, family and they are in agreement.  Dr. Ola Spurr spoke with Dr. Nicole Kindred from dermatology and he will see the patient today.  Patient has history of hepatitis C, most recent HCVRNA showed viral load up to 2 lakhs.  Consult gastroenterology.  Also obtain ultrasound of liver to evaluate if she has cirrhosis.  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  With iron saturation only 7.  All the records are reviewed and case discussed with Care Management/Social Workerr. Management plans discussed with the patient, family and they are in agreement.  CODE STATUS: fc   TOTAL TIME TAKING CARE OF THIS PATIENT: 35  minutes.   POSSIBLE D/C IN 2 DAYS, DEPENDING ON CLINICAL CONDITION.  Note: This dictation was prepared with Dragon dictation along with smaller phrase technology. Any transcriptional errors that result from this process are  unintentional.   Epifanio Lesches M.D on 10/21/2017 at 12:38 PM  Between 7am to 6pm - Pager - [phone] After 6pm go to [email or web address] - password [email or web address] [facility]  Office  [phone]  CC: Primary care physician; Patient, No Pcp Per

## 2017-10-21 NOTE — Consult Note (Signed)
Christine Lame, MD [facility]  [address] [place], [place] [address] Phone: [phone] Fax : [phone]  Consultation  Referring Provider:     Dr. Vianne Bulls Primary Care Physician:  Patient, No Pcp Per Primary Gastroenterologist:  Althia Forts         Reason for Consultation:     Hepatitis C  Date of Admission:  10/16/2017 Date of Consultation:  10/21/2017         HPI:   Christine Nichols is a 62 y.o. female Who is admitted with a rash.  The patient has a history of hepatitis C and was followed at [facility] multiple times with a viral load dating back to 2012 of 3.3 million International units per milliliter.  2 years later it went up to 3.4 million international units per liter.  The patient had her viral load checked again in March of this year that showed the viral load to be down to 2 million international units per milliliter.  The patient was found to have genotype 1A.  She reports that she was never treated at [facility] because she didn't have insurance and she was also reporting that she was told that she wasn't sick enough to get treatment.  The patient now has a rash that covers her extremities and her arms mostly but is also present on her back and trunk but to a lesser extent.  I'm now being asked to see the patient with chronic hepatitis C for at least the last 6 years for this rash.  The patient reports that she contracted hepatitis C from sharing needles with her husband and she reports that he has not been treated either.  Past Medical History:  Diagnosis Date  . Hypertension     Past Surgical History:  Procedure Laterality Date  . ABDOMINAL SURGERY      Prior to Admission medications   Medication Sig Start Date End Date Taking? Authorizing Provider  albuterol (PROVENTIL HFA;VENTOLIN HFA) 108 (90 Base) MCG/ACT inhaler Inhale 2 puffs into the lungs every 6 (six) hours as needed for wheezing or shortness of breath.   Yes [provider]  citalopram (CELEXA) 40 MG  tablet Take 1 tablet by mouth daily. 10/10/17  Yes [provider]  levothyroxine (SYNTHROID, LEVOTHROID) 75 MCG tablet Take 75 mcg by mouth daily before breakfast.   Yes [provider]  lisinopril (PRINIVIL,ZESTRIL) 30 MG tablet Take 1 tablet by mouth daily. 10/10/17  Yes [provider]  valACYclovir (VALTREX) 500 MG tablet Take 1 tablet by mouth daily. 10/09/17  Yes [provider]  docusate sodium (COLACE) 100 MG capsule Take 1 tablet once or twice daily as needed for constipation while taking narcotic pain medicine Patient not taking: Reported on 10/16/2017 08/09/17   [name], MD  HYDROcodone-acetaminophen (NORCO/VICODIN) 5-325 MG tablet Take 1-2 tablets by mouth every 6 (six) hours as needed for moderate pain or severe pain. Patient not taking: Reported on 10/16/2017 08/09/17   [name], MD    No family history on file.   Social History   Tobacco Use  . Smoking status: Heavy Tobacco Smoker  . Smokeless tobacco: Never Used  Substance Use Topics  . Alcohol use: Never    Frequency: Never  . Drug use: Never    Allergies as of 10/16/2017 - Review Complete 10/16/2017  Allergen Reaction Noted  . Sulfa antibiotics Anaphylaxis 08/08/2017  . Codeine Nausea Only 08/08/2017  . Penicillins Rash 08/08/2017    Review of Systems:  All systems reviewed and negative except where noted in HPI.   Physical Exam:  Vital signs in last 24 hours: Temp:  [98.4 F (36.9 C)-98.9 F (37.2 C)] 98.9 F (37.2 C) (04/29 1633) Pulse Rate:  [78-86] 78 (04/29 1633) Resp:  [18] 18 (04/29 0745) BP: (132-146)/(78-91) 146/91 (04/29 1633) SpO2:  [94 %-96 %] 96 % (04/29 1633) Last BM Date: 10/15/17 General:   Pleasant, cooperative in NAD Head:  Normocephalic and atraumatic. Eyes:   No icterus.   Conjunctiva pink. PERRLA. Ears:  Normal auditory acuity. Neck:  Supple; no masses or thyroidomegaly Lungs: Respirations even and unlabored. Lungs clear to auscultation  bilaterally.   No wheezes, crackles, or rhonchi.  Heart:  Regular rate and rhythm;  Without murmur, clicks, rubs or gallops Abdomen:  Soft, nondistended, nontender. Normal bowel sounds. No appreciable masses or hepatomegaly.  No rebound or guarding.  Rectal:  Not performed. Msk:  Symmetrical without gross deformities.   Extremities:  Without edema, cyanosis or clubbing. Neurologic:  Alert and oriented x3;  grossly normal neurologically. Skin:  Diffuse rash on the extremities and to a lesser extent on the trunk.  Very few lesions if any at all on the face. Cervical Nodes:  No significant cervical adenopathy. Psych:  Alert and cooperative. Normal affect.  LAB RESULTS: Recent Labs    10/19/17 0600  WBC 8.2  HGB 10.5*  HCT 33.1*  PLT 340   BMET Recent Labs    10/19/17 0600  NA 134*  K 4.6  CL 102  CO2 27  GLUCOSE 112*  BUN 13  CREATININE 0.81  CALCIUM 8.3*   LFT Recent Labs    10/19/17 0600  PROT 6.3*  ALBUMIN 2.7*  AST 38  ALT 32  ALKPHOS 164*  BILITOT 0.3   PT/INR No results for input(s): LABPROT, INR in the last 72 hours.  STUDIES: No results found.    Impression / Plan:   Christine Nichols is a 62 y.o. y/o female with With a history of hepatitis C and a diffuse rash. The patient has been evaluated by rheumatology and infectious disease.  The patient has a history of chronic anemia in addition to hepatitis C and developed a rash last year with itching over her back. This patient's viral level of hepatitis C is typically the average seen with our chronically infected hepatitis C patients and does not represent a higher than average viral load.  In fact it is down from her levels back in 2012. The patient had an upper endoscopy in 2017 without any sign of varices or chronic liver disease.   The most common skin lesion seen with hepatitis C consist of Uticaria, Lichen planus, purpura associated with mixed cryoglobulinemia (rare), Porphyria cutanea tarda, necrolytic  acral erythema and spider veins. The latter 3 are most often seen in advanced liver disease.  None of the rashes mentioned above associate with hepatitis C would present in this manner or present as such.  The patient has been given my card and told to contact my office to be treated for her hepatitis C and she has also been told to have her contact our office to have her husband treated.  I would recommend looking for a different cause of this patient's rash then her hepatitis C.   Thank you for involving me in the care of this patient.      LOS: 5 days   Christine Lame, MD  10/21/2017, 6:31 PM   Note: This dictation was prepared  with Dragon dictation along with smaller phrase technology. Any transcriptional errors that result from this process are unintentional.

## 2017-10-21 NOTE — Progress Notes (Signed)
Awake on rounds. States she  Feels better presently. Watching tv.  Call bell in reach

## 2017-10-21 NOTE — Progress Notes (Addendum)
Discussed with dermatologist Dr. 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  Discontinued isolation as per Dr. Ola Spurr recommendation.  Discontinue IV acyclovir.  Patient has no IV access but because of her diffuse rash we will give her at least 1 day of rest without IV lines patient has been stable for the last 1 week without need for IV fluids.  Will discontinue IV acyclovir also.  Start p.o. PPIs.  Discontinue GI consult.  Will discuss with GI about her hepatitis C viral load.  Patient is to follow-up with [facility] as an outpatient regarding her hepatitis C treatment.  Her viral load is decreasing compared to previous.

## 2017-10-21 NOTE — Progress Notes (Addendum)
MD Nicole Kindred (dermatology) came to see pt. Please see written consult note in chart. Verbal order received for a 1 lb jar of TCA cream to applied to entire body bid. Pharmacy Corene Cornea was notified of the need of the 1 lb jar. MD Lyndle Herrlich discussed plan of care with MD Nicole Kindred.

## 2017-10-21 NOTE — Progress Notes (Signed)
Per MD fitzgerald ok to d/c airborne and contact precautions. Per Hubert Azure ok to d/c.

## 2017-10-22 ENCOUNTER — Inpatient Hospital Stay: Payer: Medicaid Other

## 2017-10-22 LAB — ANTI-DNA ANTIBODY, DOUBLE-STRANDED

## 2017-10-22 LAB — C3 COMPLEMENT: C3 Complement: 129 mg/dL (ref 82–167)

## 2017-10-22 LAB — C4 COMPLEMENT: Complement C4, Body Fluid: 26 mg/dL (ref 14–44)

## 2017-10-22 NOTE — Progress Notes (Signed)
[place] at [place] NAME: Arline Ketter    MR#:  [id number]  DATE OF BIRTH:  1955-11-28   seen at bedside, her rash is little better.  No fever.  CHIEF COMPLAINT:   REVIEW OF SYSTEMS:  CONSTITUTIONAL: No fever, fatigue or weakness.  EYES: No blurred or double vision.  EARS, NOSE, AND THROAT: No tinnitus or ear pain.  RESPIRATORY: No cough, shortness of breath, wheezing or hemoptysis.  CARDIOVASCULAR: No chest pain, orthopnea, edema.  GASTROINTESTINAL: No nausea, vomiting, diarrhea or abdominal pain.  GENITOURINARY: No dysuria, hematuria.  ENDOCRINE: No polyuria, nocturia,  HEMATOLOGY: No anemia, easy bruising or bleeding SKIN:Diffuse skin rash, complains of itching today. MUSCULOSKELETAL: No joint pain or arthritis.   NEUROLOGIC: No tingling, numbness, weakness.  PSYCHIATRY: No anxiety or depression.   DRUG ALLERGIES:   Allergies  Allergen Reactions  . Sulfa Antibiotics Anaphylaxis  . Codeine Nausea Only  . Penicillins Rash    Has patient had a PCN reaction causing immediate rash, facial/tongue/throat swelling, SOB or lightheadedness with hypotension: Yes Has patient had a PCN reaction causing severe rash involving mucus membranes or skin necrosis: No Has patient had a PCN reaction that required hospitalization: No Has patient had a PCN reaction occurring within the last 10 years: No If all of the above answers are "NO", then may proceed with Cephalosporin use.    VITALS:  Blood pressure (!) 150/90, pulse 69, temperature 97.9 F (36.6 C), temperature source Oral, resp. rate 18, weight 72.6 kg (160 lb), SpO2 97 %.  PHYSICAL EXAMINATION:  GENERAL:  62 y.o.-year-old patient lying in the bed with no acute distress.  EYES: Pupils equal, round, reactive to light and accommodation. No scleral icterus. Extraocular muscles intact.  HEENT: Head atraumatic, normocephalic. Oropharynx and nasopharynx clear.  NECK:  Supple, no  jugular venous distention. No thyroid enlargement, no tenderness.  LUNGS: Normal breath sounds bilaterally, no wheezing, rales,rhonchi or crepitation. No use of accessory muscles of respiration.  CARDIOVASCULAR: S1, S2 normal. No murmurs, rubs, or gallops.  ABDOMEN: Soft, nontender, nondistended. Bowel sounds present. No organomegaly or mass.  EXTREMITIES: No pedal edema, cyanosis, or clubbing.  NEUROLOGIC: Cranial nerves II through XII are intact. Muscle strength 5/5 in all extremities. Sensation intact. Gait not checked.  PSYCHIATRIC: The patient is alert and oriented x 3.  SKIN: Noted to have diffuse rash some more vesicles some are scabbing type sparing face and sole.  He had a big blister on the palm of right hand and she popped it last night.Marland Kitchen  LABORATORY PANEL:   CBC Recent Labs  Lab 10/19/17 0600  WBC 8.2  HGB 10.5*  HCT 33.1*  PLT 340   ------------------------------------------------------------------------------------------------------------------  Chemistries  Recent Labs  Lab 10/19/17 0600  NA 134*  K 4.6  CL 102  CO2 27  GLUCOSE 112*  BUN 13  CREATININE 0.81  CALCIUM 8.3*  AST 38  ALT 32  ALKPHOS 164*  BILITOT 0.3   ------------------------------------------------------------------------------------------------------------------  Cardiac Enzymes No results for input(s): TROPONINI in the last 168 hours. ------------------------------------------------------------------------------------------------------------------  RADIOLOGY:  US Abdomen Limited Ruq  Result Date: 10/22/2017 CLINICAL DATA:  Hepatic cirrhosis EXAM: ULTRASOUND ABDOMEN LIMITED RIGHT UPPER QUADRANT COMPARISON:  CT abdomen and pelvis May 29, 2012 FINDINGS: Gallbladder: Surgically absent. Common bile duct: Diameter: 7 mm, within normal limits for post cholecystectomy state. Liver: No focal lesion identified. Within normal limits in parenchymal echogenicity. Portal vein is patent on color  Doppler imaging with normal direction  of blood flow towards the liver. IMPRESSION: Gallbladder absent. No liver lesions evident. Common bile duct within normal limits for post cholecystectomy state. Electronically Signed   By: Lowella Grip III M.D.   On: 10/22/2017 10:06    EKG:   Orders placed or performed during the hospital encounter of 08/08/17  . ED EKG within 10 minutes  . ED EKG within 10 minutes  . EKG 12-Lead  . EKG 12-Lead  . 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  Biopsy done by dermatology yesterday.  Started on high-dose prednisone, Atarax so for Atarax helping her with the itching.  Monitor closely and see how she does with rash.  Isolation discontinued yesterday.  Hyponatremia.  Clinically improving with IV fluids sodium at 134  Hypertension.    Lisinopril discontinued yesterday, possible worsening of pemphigus with lisinopril as per discussion with the dermatology.    Tobacco abuse.  Smoking cessation was consulted for 4 minutes. Constipation: Use stool softeners. Hep C;, seen by gastroenterology, liver  ultrasound showed gallbladder absent, no liver lesions, CBD normal, no focal lesions, no cirrhosis.    All the records are reviewed and case discussed with ED provider. Management plans discussed with the patient, family and they are in agreement.  All the records are reviewed and case discussed with Care Management/Social Workerr. Management plans discussed with the patient, family and they are in agreement.  CODE STATUS: fc   TOTAL TIME TAKING CARE OF THIS PATIENT: 35  minutes.   POSSIBLE D/C IN 2 DAYS, DEPENDING ON CLINICAL CONDITION.  Note: This dictation was prepared with Dragon dictation along with smaller phrase technology. Any transcriptional errors that result from this process are unintentional.   Epifanio Lesches M.D on 10/22/2017 at 1:33 PM  Between 7am to 6pm - Pager -  [phone] After 6pm go to [email or web address] - password [email or web address] [place] [facility]  Office  [phone]  CC: Primary care physician; Patient, No Pcp Per

## 2017-10-22 NOTE — Plan of Care (Signed)
  Problem: Pain Managment: Goal: General experience of comfort will improve Outcome: Progressing   Problem: Skin Integrity: Goal: Risk for impaired skin integrity will decrease Outcome: Progressing   

## 2017-10-22 NOTE — Progress Notes (Signed)
ID E note Seen by derm and discussed with them. Likely BP.  Stopped acyclovir and keflex. Agree with steroids. Will sign off. No need for ID fu.

## 2017-10-22 NOTE — Progress Notes (Signed)
Received report of pt being NPO since midnight. Confirmed with [place] to be performed this day.

## 2017-10-23 LAB — CREATININE, SERUM
CREATININE: 0.78 mg/dL (ref 0.44–1.00)
GFR calc Af Amer: >60 mL/min (ref >60)
GFR calc non Af Amer: >60 mL/min (ref >60)

## 2017-10-23 LAB — VARICELLA-ZOSTER BY PCR: VARICELLA-ZOSTER, PCR: NEGATIVE

## 2017-10-23 NOTE — Progress Notes (Signed)
[place] at [place] NAME: Christine Nichols    MR#:  [id number]  DATE OF BIRTH:  March 28, 1956   seen at bedside, her rash is little better.  No fever.  Has some constipation.  Likely discharge home tomorrow.  Biopsies confiredpemphigus.  CHIEF COMPLAINT:   REVIEW OF SYSTEMS:  CONSTITUTIONAL: No fever, fatigue or weakness.  EYES: No blurred or double vision.  EARS, NOSE, AND THROAT: No tinnitus or ear pain.  RESPIRATORY: No cough, shortness of breath, wheezing or hemoptysis.  CARDIOVASCULAR: No chest pain, orthopnea, edema.  GASTROINTESTINAL: No nausea, vomiting, diarrhea or abdominal pain.  GENITOURINARY: No dysuria, hematuria.  ENDOCRINE: No polyuria, nocturia,  HEMATOLOGY: No anemia, easy bruising or bleeding SKIN:Diffuse skin rash, complains of itching today. MUSCULOSKELETAL: No joint pain or arthritis.   NEUROLOGIC: No tingling, numbness, weakness.  PSYCHIATRY: No anxiety or depression.   DRUG ALLERGIES:   Allergies  Allergen Reactions  . Sulfa Antibiotics Anaphylaxis  . Codeine Nausea Only  . Penicillins Rash    Has patient had a PCN reaction causing immediate rash, facial/tongue/throat swelling, SOB or lightheadedness with hypotension: Yes Has patient had a PCN reaction causing severe rash involving mucus membranes or skin necrosis: No Has patient had a PCN reaction that required hospitalization: No Has patient had a PCN reaction occurring within the last 10 years: No If all of the above answers are "NO", then may proceed with Cephalosporin use.    VITALS:  Blood pressure (!) 166/93, pulse 65, temperature 98.3 F (36.8 C), temperature source Oral, resp. rate 18, height 5\' 6"  (1.676 m), weight 74.8 kg (165 lb), SpO2 100 %.  PHYSICAL EXAMINATION:  GENERAL:  62 y.o.-year-old patient lying in the bed with no acute distress.  EYES: Pupils equal, round, reactive to light and accommodation. No scleral icterus. Extraocular  muscles intact.  HEENT: Head atraumatic, normocephalic. Oropharynx and nasopharynx clear.  NECK:  Supple, no jugular venous distention. No thyroid enlargement, no tenderness.  LUNGS: Normal breath sounds bilaterally, no wheezing, rales,rhonchi or crepitation. No use of accessory muscles of respiration.  CARDIOVASCULAR: S1, S2 normal. No murmurs, rubs, or gallops.  ABDOMEN: Soft, nontender, nondistended. Bowel sounds present. No organomegaly or mass.  EXTREMITIES: No pedal edema, cyanosis, or clubbing.  NEUROLOGIC: Cranial nerves II through XII are intact. Muscle strength 5/5 in all extremities. Sensation intact. Gait not checked.  PSYCHIATRIC: The patient is alert and oriented x 3.  SKIN: Noted to have diffuse rash some more vesicles some are scabbing type sparing face and sole.  He had a big blister on the palm of right hand and she popped it last night.Marland Kitchen  LABORATORY PANEL:   CBC Recent Labs  Lab 10/19/17 0600  WBC 8.2  HGB 10.5*  HCT 33.1*  PLT 340   ------------------------------------------------------------------------------------------------------------------  Chemistries  Recent Labs  Lab 10/19/17 0600 10/23/17 0513  NA 134*  --   K 4.6  --   CL 102  --   CO2 27  --   GLUCOSE 112*  --   BUN 13  --   CREATININE 0.81 0.78  CALCIUM 8.3*  --   AST 38  --   ALT 32  --   ALKPHOS 164*  --   BILITOT 0.3  --    ------------------------------------------------------------------------------------------------------------------  Cardiac Enzymes No results for input(s): TROPONINI in the last 168 hours. ------------------------------------------------------------------------------------------------------------------  RADIOLOGY:  US Abdomen Limited Ruq  Result Date: 10/22/2017 CLINICAL DATA:  Hepatic cirrhosis EXAM: ULTRASOUND ABDOMEN  LIMITED RIGHT UPPER QUADRANT COMPARISON:  CT abdomen and pelvis May 29, 2012 FINDINGS: Gallbladder: Surgically absent. Common bile duct:  Diameter: 7 mm, within normal limits for post cholecystectomy state. Liver: No focal lesion identified. Within normal limits in parenchymal echogenicity. Portal vein is patent on color Doppler imaging with normal direction of blood flow towards the liver. IMPRESSION: Gallbladder absent. No liver lesions evident. Common bile duct within normal limits for post cholecystectomy state. Electronically Signed   By: Lowella Grip III M.D.   On: 10/22/2017 10:06    EKG:   Orders placed or performed during the hospital encounter of 08/08/17  . ED EKG within 10 minutes  . ED EKG within 10 minutes  . EKG 12-Lead  . EKG 12-Lead  . EKG    ASSESSMENT AND PLAN:  Diffuse maculopapular rash likely due to pemphigus. dermatology did biopsy day before yesterday, according to biopsy result confirming for pemphigus.  Dermatologist spoke to the registered nurse yesterday.  Continue prednisone, patient rash seems to be improving.Marland Kitchen  Hyponatremia.  Clinically improving with IV fluids sodium at 134  Hypertension.    Lisinopril discontinued yesterday, possible worsening of pemphigus with lisinopril as per discussion with the dermatology.    Tobacco abuse.  Smoking cessation was consulted for 4 minutes. Constipation: Use stool softeners. Hep C;, seen by gastroenterology, liver  ultrasound showed gallbladder absent, no liver lesions, CBD normal, no focal lesions, no cirrhosis.    All the records are reviewed and case discussed with ED provider. Management plans discussed with the patient, family and they are in agreement.  All the records are reviewed and case discussed with Care Management/Social Workerr. Management plans discussed with the patient, family and they are in agreement.  CODE STATUS: fc   TOTAL TIME TAKING CARE OF THIS PATIENT: 35  minutes.   POSSIBLE D/C IN 2 DAYS, DEPENDING ON CLINICAL CONDITION.  Note: This dictation was prepared with Dragon dictation along with smaller phrase technology.  Any transcriptional errors that result from this process are unintentional.   Epifanio Lesches M.D on 10/23/2017 at 12:19 PM  Between 7am to 6pm - Pager - [phone] After 6pm go to [email or web address] - password [email or web address] [facility]  Office  [phone]  CC: Primary care physician; Patient, No Pcp Per

## 2017-10-24 MED ORDER — GABAPENTIN 100 MG PO CAPS: 100.0000 mg | ORAL_CAPSULE | Freq: Three times a day (TID) | ORAL | 0 refills | Status: DC

## 2017-10-24 MED ORDER — NICOTINE 21 MG/24HR TD PT24: 21.0000 mg | MEDICATED_PATCH | Freq: Every day | TRANSDERMAL | 0 refills | Status: DC

## 2017-10-24 MED ORDER — PREDNISONE 20 MG PO TABS: 60.0000 mg | ORAL_TABLET | Freq: Every day | ORAL | 0 refills | Status: DC

## 2017-10-24 MED ORDER — CLOBETASOL PROPIONATE 0.05 % EX CREA: TOPICAL_CREAM | Freq: Two times a day (BID) | CUTANEOUS | 0 refills | Status: DC

## 2017-10-24 MED ORDER — HYDROXYZINE HCL 25 MG PO TABS: 25.0000 mg | ORAL_TABLET | Freq: Three times a day (TID) | ORAL | 0 refills | Status: AC | PRN

## 2017-10-24 MED ORDER — FAMOTIDINE 20 MG PO TABS: 20.0000 mg | ORAL_TABLET | Freq: Every day | ORAL | 0 refills | Status: DC

## 2017-10-24 NOTE — Discharge Summary (Signed)
[facility] [place] NAME: Christine Nichols    MR#:  [id number]  DATE OF BIRTH:  1955-09-15  DATE OF ADMISSION:  10/16/2017 ADMITTING PHYSICIAN: Demetrios Loll, MD  DATE OF DISCHARGE: No discharge date for patient encounter.  PRIMARY CARE PHYSICIAN: Patient, No Pcp Per    ADMISSION DIAGNOSIS:  Rash [R21]  DISCHARGE DIAGNOSIS:  Active Problems:   Varicella   Chronic hepatitis C without hepatic coma ([place])   SECONDARY DIAGNOSIS:   Past Medical History:  Diagnosis Date  . Hypertension     HOSPITAL COURSE:  *Acute pemphigoid  Status post evaluation by infectious disease as well as dermatology  Status post biopsy result confirming for pemphigus Treated with prednisone-patient to follow-up with primary care provider as well as dermatology status post discharge for continued care/management  Hyponatremia Repleted with IV fluids  Hypertension Controlled on current regiment Lisinopril discontinued for possible worsening of above and discussion with dermatology     Tobacco abuse Stable Provided cessation counseling and nicotine patch while in house  Acute constipation Treated with stool softeners  Hep C Seen by gastroenterology liver ultrasound showed gallbladder absent, no liver lesions, CBD normal, no focal lesions, no cirrhosis   DISCHARGE CONDITIONS:  Stable, for discharge to home with follow-up with her primary care provider at [facility] and dermatology in 1 week for reevaluation, for more specific details please see chart   CONSULTS OBTAINED:  Treatment Team:  Leonel Ramsay, MD [name]., MD Brendolyn Patty, MD Violet Seabury, [name], MD  DRUG ALLERGIES:   Allergies  Allergen Reactions  . Sulfa Antibiotics Anaphylaxis  . Codeine Nausea Only  . Penicillins Rash    Has patient had a PCN reaction causing immediate rash, facial/tongue/throat swelling, SOB or lightheadedness with hypotension: Yes Has  patient had a PCN reaction causing severe rash involving mucus membranes or skin necrosis: No Has patient had a PCN reaction that required hospitalization: No Has patient had a PCN reaction occurring within the last 10 years: No If all of the above answers are "NO", then may proceed with Cephalosporin use.    DISCHARGE MEDICATIONS:   Allergies as of 10/24/2017      Reactions   Sulfa Antibiotics Anaphylaxis   Codeine Nausea Only   Penicillins Rash   Has patient had a PCN reaction causing immediate rash, facial/tongue/throat swelling, SOB or lightheadedness with hypotension: Yes Has patient had a PCN reaction causing severe rash involving mucus membranes or skin necrosis: No Has patient had a PCN reaction that required hospitalization: No Has patient had a PCN reaction occurring within the last 10 years: No If all of the above answers are "NO", then may proceed with Cephalosporin use.      Medication List    STOP taking these medications   lisinopril 30 MG tablet Commonly known as:  PRINIVIL,ZESTRIL   valACYclovir 500 MG tablet Commonly known as:  VALTREX     TAKE these medications   albuterol 108 (90 Base) MCG/ACT inhaler Commonly known as:  PROVENTIL HFA;VENTOLIN HFA Inhale 2 puffs into the lungs every 6 (six) hours as needed for wheezing or shortness of breath.   citalopram 40 MG tablet Commonly known as:  CELEXA Take 1 tablet by mouth daily.   clobetasol cream 0.05 % Commonly known as:  TEMOVATE Apply topically 2 (two) times daily.   docusate sodium 100 MG capsule Commonly known as:  COLACE Take 1 tablet once or twice daily as needed for  constipation while taking narcotic pain medicine   famotidine 20 MG tablet Commonly known as:  PEPCID Take 1 tablet (20 mg total) by mouth daily. Start taking on:  10/25/2017   gabapentin 100 MG capsule Commonly known as:  NEURONTIN Take 1 capsule (100 mg total) by mouth 3 (three) times daily.   HYDROcodone-acetaminophen 5-325 MG  tablet Commonly known as:  NORCO/VICODIN Take 1-2 tablets by mouth every 6 (six) hours as needed for moderate pain or severe pain.   hydrOXYzine 25 MG tablet Commonly known as:  ATARAX/VISTARIL Take 1 tablet (25 mg total) by mouth every 8 (eight) hours as needed for anxiety or itching.   levothyroxine 75 MCG tablet Commonly known as:  SYNTHROID, LEVOTHROID Take 75 mcg by mouth daily before breakfast.   nicotine 21 mg/24hr patch Commonly known as:  NICODERM CQ - dosed in mg/24 hours Place 1 patch (21 mg total) onto the skin daily. Start taking on:  10/25/2017   predniSONE 20 MG tablet Commonly known as:  DELTASONE Take 3 tablets (60 mg total) by mouth daily with breakfast. Start taking on:  10/25/2017        DISCHARGE INSTRUCTIONS:  If you experience worsening of your admission symptoms, develop shortness of breath, life threatening emergency, suicidal or homicidal thoughts you must seek medical attention immediately by calling 911 or calling your MD immediately  if symptoms less severe.  You Must read complete instructions/literature along with all the possible adverse reactions/side effects for all the Medicines you take and that have been prescribed to you. Take any new Medicines after you have completely understood and accept all the possible adverse reactions/side effects.   Please note  You were cared for by a hospitalist during your hospital stay. If you have any questions about your discharge medications or the care you received while you were in the hospital after you are discharged, you can call the unit and asked to speak with the hospitalist on call if the hospitalist that took care of you is not available. Once you are discharged, your primary care physician will handle any further medical issues. Please note that NO REFILLS for any discharge medications will be authorized once you are discharged, as it is imperative that you return to your primary care physician (or  establish a relationship with a primary care physician if you do not have one) for your aftercare needs so that they can reassess your need for medications and monitor your lab values.    Today   CHIEF COMPLAINT:   Chief Complaint  Patient presents with  . Rash    HISTORY OF PRESENT ILLNESS:  62 y.o. female with a known history of hypertension.  Patient did present to ED with above chief complaints.  She was recently diagnosed with varicella-zoster, given total 2 courses of vacyclovir for 14 days without improvement.  The rash spread all over the body and worsening.  She feels painful but not itchy.  Dr. Archie Balboa start IV acyclovir and request admission.  VITAL SIGNS:  Blood pressure (!) 150/90, pulse 86, temperature 98.2 F (36.8 C), temperature source Oral, resp. rate 17, height 5\' 6"  (1.676 m), weight 74.8 kg (165 lb), SpO2 98 %.  I/O:    Intake/Output Summary (Last 24 hours) at 10/24/2017 1219 Last data filed at 10/24/2017 0900 Gross per 24 hour  Intake 360 ml  Output -  Net 360 ml    PHYSICAL EXAMINATION:  GENERAL:  62 y.o.-year-old patient lying in the bed with no acute  distress.  EYES: Pupils equal, round, reactive to light and accommodation. No scleral icterus. Extraocular muscles intact.  HEENT: Head atraumatic, normocephalic. Oropharynx and nasopharynx clear.  NECK:  Supple, no jugular venous distention. No thyroid enlargement, no tenderness.  LUNGS: Normal breath sounds bilaterally, no wheezing, rales,rhonchi or crepitation. No use of accessory muscles of respiration.  CARDIOVASCULAR: S1, S2 normal. No murmurs, rubs, or gallops.  ABDOMEN: Soft, non-tender, non-distended. Bowel sounds present. No organomegaly or mass.  EXTREMITIES: No pedal edema, cyanosis, or clubbing.  NEUROLOGIC: Cranial nerves II through XII are intact. Muscle strength 5/5 in all extremities. Sensation intact. Gait not checked.  PSYCHIATRIC: The patient is alert and oriented x 3.  SKIN: No obvious  rash, lesion, or ulcer.   DATA REVIEW:   CBC Recent Labs  Lab 10/19/17 0600  WBC 8.2  HGB 10.5*  HCT 33.1*  PLT 340    Chemistries  Recent Labs  Lab 10/19/17 0600 10/23/17 0513  NA 134*  --   K 4.6  --   CL 102  --   CO2 27  --   GLUCOSE 112*  --   BUN 13  --   CREATININE 0.81 0.78  CALCIUM 8.3*  --   AST 38  --   ALT 32  --   ALKPHOS 164*  --   BILITOT 0.3  --     Cardiac Enzymes No results for input(s): TROPONINI in the last 168 hours.  Microbiology Results  No results found for this or any previous visit.  RADIOLOGY:  No results found.  EKG:   Orders placed or performed during the hospital encounter of 08/08/17  . ED EKG within 10 minutes  . ED EKG within 10 minutes  . EKG 12-Lead  . EKG 12-Lead  . EKG      Management plans discussed with the patient, family and they are in agreement.  CODE STATUS:     Code Status Orders  (From admission, onward)        Start     Ordered   10/16/17 1848  Full code  Continuous     10/16/17 1847    Code Status History    This patient has a current code status but no historical code status.      TOTAL TIME TAKING CARE OF THIS PATIENT: 45 minutes.    [name] Harmoni Lucus M.D on 10/24/2017 at 12:19 PM  Between 7am to 6pm - Pager - [phone]  After 6pm go to [email or web address] - password [email or web address] [facility]  Office  [phone]  CC: Primary care physician; Patient, No Pcp Per   Note: This dictation was prepared with Dragon dictation along with smaller phrase technology. Any transcriptional errors that result from this process are unintentional.

## 2017-10-24 NOTE — Evaluation (Signed)
Physical Therapy Evaluation Patient Details Name: Christine Nichols MRN: [id number] DOB: Apr 06, 1956 Today's Date: 10/24/2017   History of Present Illness  62 y/o female here with pemphigus rash, fever.    Clinical Impression  Pt has not been out of room in days and generally has been feeling weak, but was still independent with all mobility, confidently and safely ambulated 300 ft w/o AD and easily negotiated up/down stairs w/o issue. Pt does not require further PT intervention and is safe to go home from PT perspective.  No further needs.    Follow Up Recommendations No PT follow up    Equipment Recommendations  None recommended by PT    Recommendations for Other Services       Precautions / Restrictions Precautions Precautions: None Restrictions Weight Bearing Restrictions: No      Mobility  Bed Mobility Overal bed mobility: Independent                Transfers Overall transfer level: Independent Equipment used: None             General transfer comment: Pt easily gets to standing and maintains balance  Ambulation/Gait Ambulation/Gait assistance: Independent Ambulation Distance (Feet): 300 Feet Assistive device: None       General Gait Details: Pt walked with good speed, safety and confidence. No excessive fatigue or other issues.  Stairs Stairs: Yes Stairs assistance: Modified independent (Device/Increase time) Stair Management: One rail Left Number of Stairs: 8 General stair comments: Pt was able to easily negotiate steps with light UE use  Wheelchair Mobility    Modified Rankin (Stroke Patients Only)       Balance Overall balance assessment: Independent                                           Pertinent Vitals/Pain Pain Assessment: (rash feels like "cactus needles" some chronic R knee pain)    Home Living Family/patient expects to be discharged to:: Private residence Living Arrangements: Spouse/significant other    Type of Home: Mobile home Home Access: Stairs to enter Entrance Stairs-Rails: None(banister at landing) Technical brewer of Steps: 4   Home Equipment: None      Prior Function Level of Independence: Independent         Comments: Pt works 2 jobs, is on her feet all day, able to stay very active normally     Hand Dominance        Extremity/Trunk Assessment   Upper Extremity Assessment Upper Extremity Assessment: Overall WFL for tasks assessed    Lower Extremity Assessment Lower Extremity Assessment: Overall WFL for tasks assessed       Communication   Communication: No difficulties  Cognition Arousal/Alertness: Awake/alert Behavior During Therapy: WFL for tasks assessed/performed Overall Cognitive Status: Within Functional Limits for tasks assessed                                        General Comments      Exercises     Assessment/Plan    PT Assessment Patent does not need any further PT services  PT Problem List         PT Treatment Interventions      PT Goals (Current goals can be found in the Care Plan section)  Acute Rehab PT  Goals Patient Stated Goal: go home tody PT Goal Formulation: All assessment and education complete, DC therapy    Frequency     Barriers to discharge        Co-evaluation               AM-PAC PT "6 Clicks" Daily Activity  Outcome Measure Difficulty turning over in bed (including adjusting bedclothes, sheets and blankets)?: None Difficulty moving from lying on back to sitting on the side of the bed? : None Difficulty sitting down on and standing up from a chair with arms (e.g., wheelchair, bedside commode, etc,.)?: None Help needed moving to and from a bed to chair (including a wheelchair)?: None Help needed walking in hospital room?: None Help needed climbing 3-5 steps with a railing? : None 6 Click Score: 24    End of Session   Activity Tolerance: Patient tolerated treatment  well Patient left: in chair;with call bell/phone within reach Nurse Communication: Mobility status PT Visit Diagnosis: Muscle weakness (generalized) (M62.81)    Time: 5465-0354 PT Time Calculation (min) (ACUTE ONLY): 18 min   Charges:   PT Evaluation $PT Eval Low Complexity: 1 Low     PT G CodesKreg Shropshire, DPT 10/24/2017, 11:15 AM

## 2017-10-24 NOTE — Progress Notes (Signed)
Pt ready for d/c home today per MD. Reviewed discharge instructions and prescriptions with pt and her husband, all questions answered. Pt aware dermatology follow up needs to be 5/6 for stitch in thigh to be removed (called multiple times from hospital to [facility] did not answer and pt did not want to wait)-states she will make appoinment. VSS, in NAD. Volunteer services helped escort pt to car.   [place], Jerry Caras

## 2017-10-24 NOTE — Care Management Note (Signed)
Case Management Note  Patient Details  Name: Christine Nichols MRN: [id number] Date of Birth: 1955/08/11  Subjective/Objective:  Application given for medication management and [facility]. Email sent to both agencies.                   Action/Plan:   Expected Discharge Date:  10/19/17               Expected Discharge Plan:     In-House Referral:     Discharge planning Services  CM Consult, [facility], Medication Assistance  Post Acute Care Choice:    Choice offered to:     DME Arranged:    DME Agency:     HH Arranged:    HH Agency:     Status of Service:  Completed, signed off  If discussed at [employer], dates discussed:    Additional Comments:  Jolly Mango, RN 10/24/2017, 11:35 AM

## 2017-10-28 ENCOUNTER — Ambulatory Visit
Admit: 2017-10-28 | Discharge: 2017-10-29 | Attending: Student in an Organized Health Care Education/Training Program | Primary: Student in an Organized Health Care Education/Training Program

## 2017-10-28 DIAGNOSIS — L920 Granuloma annulare: Secondary | ICD-10-CM

## 2017-10-28 DIAGNOSIS — L120 Bullous pemphigoid: Principal | ICD-10-CM

## 2017-10-28 DIAGNOSIS — B182 Chronic viral hepatitis C: Secondary | ICD-10-CM

## 2017-10-28 DIAGNOSIS — Z4802 Encounter for removal of sutures: Secondary | ICD-10-CM

## 2017-10-28 DIAGNOSIS — I10 Essential (primary) hypertension: Secondary | ICD-10-CM

## 2017-10-28 DIAGNOSIS — F17210 Nicotine dependence, cigarettes, uncomplicated: Secondary | ICD-10-CM

## 2017-10-28 MED ORDER — NICOTINE 21 MG/24 HR DAILY TRANSDERMAL PATCH: each | 0 refills | 0 days

## 2017-10-28 MED ORDER — CLOBETASOL 0.05 % TOPICAL CREAM
Freq: Two times a day (BID) | TOPICAL | 0 refills | 0.00000 days | Status: CP
Start: 2017-10-28 — End: 2017-11-04

## 2017-10-28 MED ORDER — AMLODIPINE 5 MG TABLET
ORAL_TABLET | Freq: Every day | ORAL | 0 refills | 0 days | Status: CP
Start: 2017-10-28 — End: 2017-11-04

## 2017-10-28 MED ORDER — CLOBETASOL 0.05 % TOPICAL CREAM: g | 0 refills | 0 days

## 2017-10-28 MED ORDER — AMLODIPINE 5 MG TABLET: 5 mg | tablet | 0 refills | 0 days

## 2017-10-28 MED ORDER — NICOTINE 21 MG/24 HR DAILY TRANSDERMAL PATCH
MEDICATED_PATCH | TRANSDERMAL | 0 refills | 0.00000 days | Status: CP
Start: 2017-10-28 — End: 2017-10-28

## 2017-10-30 MED ORDER — LEVOTHYROXINE 75 MCG TABLET
ORAL_TABLET | 11 refills | 0 days | Status: CP
Start: 2017-10-30 — End: 2017-11-04

## 2017-11-04 ENCOUNTER — Encounter: Admit: 2017-11-04 | Discharge: 2017-11-04 | Payer: MEDICAID | Attending: Internal Medicine | Primary: Internal Medicine

## 2017-11-04 ENCOUNTER — Encounter: Admit: 2017-11-04 | Discharge: 2017-11-04 | Payer: MEDICAID

## 2017-11-04 DIAGNOSIS — E871 Hypo-osmolality and hyponatremia: Secondary | ICD-10-CM

## 2017-11-04 DIAGNOSIS — L120 Bullous pemphigoid: Principal | ICD-10-CM

## 2017-11-04 DIAGNOSIS — I10 Essential (primary) hypertension: Secondary | ICD-10-CM

## 2017-11-04 DIAGNOSIS — B182 Chronic viral hepatitis C: Secondary | ICD-10-CM

## 2017-11-04 MED ORDER — AMLODIPINE 5 MG TABLET
ORAL_TABLET | Freq: Every day | ORAL | 3 refills | 0 days | Status: CP
Start: 2017-11-04 — End: 2017-11-06

## 2017-11-04 MED ORDER — CLOBETASOL 0.05 % TOPICAL CREAM
Freq: Two times a day (BID) | TOPICAL | 0 refills | 0.00000 days | Status: CP
Start: 2017-11-04 — End: 2018-04-30

## 2017-11-04 MED ORDER — LEVOTHYROXINE 75 MCG TABLET
ORAL_TABLET | Freq: Every day | ORAL | 11 refills | 0.00000 days | Status: CP
Start: 2017-11-04 — End: 2017-11-06

## 2017-11-04 MED ORDER — PREDNISONE 20 MG TABLET
ORAL_TABLET | Freq: Every day | ORAL | 0 refills | 0 days | Status: CP
Start: 2017-11-04 — End: 2017-11-06

## 2017-11-06 MED ORDER — LEVOTHYROXINE 75 MCG TABLET
ORAL_TABLET | Freq: Every day | ORAL | 11 refills | 0.00000 days | Status: CP
Start: 2017-11-06 — End: 2018-09-17

## 2017-11-06 MED ORDER — AMLODIPINE 5 MG TABLET
ORAL_TABLET | Freq: Every day | ORAL | 3 refills | 0.00000 days | Status: CP
Start: 2017-11-06 — End: 2018-11-25

## 2017-11-06 MED ORDER — LEVOTHYROXINE 75 MCG TABLET: 75 ug | tablet | Freq: Every day | 11 refills | 0 days | Status: AC

## 2017-11-06 MED ORDER — PREDNISONE 20 MG TABLET
ORAL_TABLET | Freq: Every day | ORAL | 0 refills | 0 days | Status: CP
Start: 2017-11-06 — End: 2017-12-06

## 2017-11-11 ENCOUNTER — Ambulatory Visit: Admit: 2017-11-11 | Discharge: 2017-11-12

## 2017-11-11 DIAGNOSIS — L120 Bullous pemphigoid: Principal | ICD-10-CM

## 2017-11-11 MED ORDER — TRIAMCINOLONE ACETONIDE 0.1 % TOPICAL OINTMENT
INTRAMUSCULAR | 6 refills | 0.00000 days | Status: CP
Start: 2017-11-11 — End: 2019-02-20

## 2017-11-11 MED ORDER — TRIAMCINOLONE ACETONIDE 0.1 % TOPICAL OINTMENT: g | 6 refills | 0 days | Status: AC

## 2017-11-11 MED ORDER — PREDNISONE 20 MG TABLET
ORAL_TABLET | Freq: Every day | ORAL | 0 refills | 0 days | Status: CP
Start: 2017-11-11 — End: 2017-12-23

## 2017-11-25 ENCOUNTER — Ambulatory Visit: Admit: 2017-11-25 | Discharge: 2017-11-26 | Attending: Internal Medicine | Primary: Internal Medicine

## 2017-11-25 DIAGNOSIS — L120 Bullous pemphigoid: Secondary | ICD-10-CM

## 2017-11-25 DIAGNOSIS — R1310 Dysphagia, unspecified: Principal | ICD-10-CM

## 2017-11-27 ENCOUNTER — Ambulatory Visit: Admit: 2017-11-27 | Discharge: 2017-11-27 | Payer: MEDICAID

## 2017-11-27 ENCOUNTER — Encounter
Admit: 2017-11-27 | Discharge: 2017-11-27 | Payer: BLUE CROSS/BLUE SHIELD | Attending: Anesthesiology | Primary: Anesthesiology

## 2017-11-27 ENCOUNTER — Encounter: Admit: 2017-11-27 | Discharge: 2017-11-27 | Payer: MEDICAID | Attending: Anesthesiology | Primary: Anesthesiology

## 2017-11-27 DIAGNOSIS — R1310 Dysphagia, unspecified: Principal | ICD-10-CM

## 2017-11-27 MED ORDER — FLUCONAZOLE 200 MG TABLET: tablet | 0 refills | 0 days | Status: AC

## 2017-11-27 MED ORDER — FLUCONAZOLE 100 MG TABLET
ORAL_TABLET | 0 refills | 0 days | Status: CP
Start: 2017-11-27 — End: 2017-11-27

## 2017-11-27 MED ORDER — FLUCONAZOLE 200 MG TABLET
ORAL_TABLET | 0 refills | 0.00000 days | Status: CP
Start: 2017-11-27 — End: 2017-11-27

## 2017-11-29 ENCOUNTER — Telehealth: Payer: Self-pay | Admitting: Pharmacist

## 2017-11-29 NOTE — Telephone Encounter (Signed)
Ms. Hendley called today to inquire about "swelling". She has been on prednisone 60 mg daily. Original intent was for her to taper down from that dose per conversation with Dr. Edward Jolly on 11-07-17. She was also started on fluconazole (14 day treatment).  Patient states she has attempted to contact her doctor several times and has not heard back yet. States her entire body is swollen and her kidneys "hurt". She feels "bloated and bad". Referred patient to the emergency room to be assessed.  Ramon Zanders K. Dicky Doe, PharmD [facility] [place] Operations Coordinator [phone]

## 2017-12-02 ENCOUNTER — Encounter (INDEPENDENT_AMBULATORY_CARE_PROVIDER_SITE_OTHER): Payer: Self-pay

## 2017-12-02 ENCOUNTER — Ambulatory Visit: Payer: Self-pay | Admitting: Pharmacy Technician

## 2017-12-02 DIAGNOSIS — Z79899 Other long term (current) drug therapy: Secondary | ICD-10-CM

## 2017-12-02 NOTE — Progress Notes (Signed)
Met with patient completed financial assistance application for [place] due to recent hospital visit.  Patient agreed to be responsible for gathering financial information and forwarding to appropriate department in [place].    Completed [facility] application and contract.  Patient agreed to all terms of the [facility] contract.    Patient approved to receive medication assistance through 2019, as long as eligibility criteria continues to be met.    Provided patient with community resource material based on her particular needs.    Ventolin Prescription Application completed with patient.  Patient taking to Dr. Christine Gladman-[facility] for signature..  Upon receipt of signed application from provider, Ventolin Prescription Application will be submitted to [employer].  Referred patient for MTM.   J.  Care Manager [facility]  

## 2017-12-04 ENCOUNTER — Encounter: Admit: 2017-12-04 | Discharge: 2017-12-04 | Payer: MEDICAID | Attending: Gastroenterology | Primary: Gastroenterology

## 2017-12-04 ENCOUNTER — Ambulatory Visit: Admit: 2017-12-04 | Discharge: 2017-12-04 | Payer: MEDICAID | Attending: Gastroenterology | Primary: Gastroenterology

## 2017-12-04 DIAGNOSIS — R21 Rash and other nonspecific skin eruption: Secondary | ICD-10-CM

## 2017-12-04 DIAGNOSIS — B182 Chronic viral hepatitis C: Principal | ICD-10-CM

## 2017-12-04 DIAGNOSIS — R300 Dysuria: Secondary | ICD-10-CM

## 2017-12-04 LAB — COMPREHENSIVE METABOLIC PANEL
ALBUMIN: 4.2 g/dL (ref 3.5–5.0)
ALKALINE PHOSPHATASE: 74 U/L (ref 38–126)
ALT (SGPT): 18 U/L (ref 15–48)
ANION GAP: 10 mmol/L (ref 9–15)
BILIRUBIN TOTAL: 0.6 mg/dL (ref 0.0–1.2)
BLOOD UREA NITROGEN: 17 mg/dL (ref 7–21)
BUN / CREAT RATIO: 18
CALCIUM: 9.5 mg/dL (ref 8.5–10.2)
CHLORIDE: 97 mmol/L — ABNORMAL LOW (ref 98–107)
CO2: 30 mmol/L (ref 22.0–30.0)
CREATININE: 0.93 mg/dL (ref 0.60–1.00)
EGFR MDRD AF AMER: 75 mL/min/{1.73_m2} (ref >=60)
GLUCOSE RANDOM: 113 mg/dL (ref 65–179)
POTASSIUM: 4.2 mmol/L (ref 3.5–5.0)
PROTEIN TOTAL: 7.4 g/dL (ref 6.5–8.3)
SODIUM: 137 mmol/L (ref 135–145)

## 2017-12-04 LAB — URINALYSIS WITH CULTURE REFLEX
BACTERIA: NONE SEEN /HPF
BILIRUBIN UA: NEGATIVE
BLOOD UA: NEGATIVE
GLUCOSE UA: NEGATIVE
KETONES UA: NEGATIVE
LEUKOCYTE ESTERASE UA: NEGATIVE
NITRITE UA: NEGATIVE
PH UA: 6 (ref 5.0–9.0)
RBC UA: 1 /HPF (ref <=4)
SPECIFIC GRAVITY UA: 1.009 (ref 1.003–1.030)
SQUAMOUS EPITHELIAL: 1 /HPF (ref 0–5)
WBC UA: 1 /HPF (ref 0–5)

## 2017-12-04 LAB — INR: Lab: 0.83

## 2017-12-04 LAB — CBC
HEMATOCRIT: 39.1 % (ref 36.0–46.0)
HEMOGLOBIN: 11.6 g/dL — ABNORMAL LOW (ref 12.0–16.0)
MEAN CORPUSCULAR HEMOGLOBIN CONC: 29.6 g/dL — ABNORMAL LOW (ref 31.0–37.0)
MEAN CORPUSCULAR HEMOGLOBIN: 23.5 pg — ABNORMAL LOW (ref 26.0–34.0)
MEAN CORPUSCULAR VOLUME: 79.2 fL — ABNORMAL LOW (ref 80.0–100.0)
MEAN PLATELET VOLUME: 8.1 fL (ref 7.0–10.0)
PLATELET COUNT: 314 10*9/L (ref 150–440)
RED BLOOD CELL COUNT: 4.94 10*12/L (ref 4.00–5.20)
RED CELL DISTRIBUTION WIDTH: 15.9 % — ABNORMAL HIGH (ref 12.0–15.0)

## 2017-12-04 LAB — BLOOD UREA NITROGEN: Urea nitrogen:MCnc:Pt:Ser/Plas:Qn:: 17

## 2017-12-04 LAB — NITRITE UA: Lab: NEGATIVE

## 2017-12-04 LAB — HEMOGLOBIN: Lab: 11.6 — ABNORMAL LOW

## 2017-12-04 LAB — PROTIME-INR: PROTIME: 9.5 s — ABNORMAL LOW (ref 10.2–12.8)

## 2017-12-04 LAB — RHEUMATOID FACTOR: Rheumatoid factor:ACnc:Pt:Ser/Plas:Qn:: 18.2 — ABNORMAL HIGH

## 2017-12-04 NOTE — Unmapped (Addendum)
1. We recommend treating your hepatitis C - we will have you meet with our pharmacist today to discuss treatment   2. Labs today  3. Fibroscan today  4. Stay away from alcohol altogether  5. Will check urine today since you are having burning with urination

## 2017-12-04 NOTE — Unmapped (Signed)
Counseling for HCV treatment     B18.2 Hep C: yes    K74.60 Cirrhosis: no,   Child Pugh Score if applicable and for Medicaid pts: n/a  Z94.4 Liver Transplant: no    Genotype: 1a (09/12/17)  HCV RNA: 2,060,054 IU/ml on 09/12/17   Fibrosis score: F1-2 (6.6 kPa) on 12/04/17  HIV Co-infection? no  Signs of liver decompensation? no  Previous treatment? naive    Planned regimen: Epclusa (sofosbuvir/velpatasvir 400/100mg ) x 12 weeks  Urgency: Routine Request  Prescribing Provider/NPI: Dr. Gavin Potters / [id number]  Signature waiver form not obtained at this time.  Insurance: None    Marie Fletcher is a 62 y.o. Caucasian female presents to clinic alone and is interested in starting treatment with Epclusa. We discussed the prior authorization (PA) process of obtaining the medication through insurance and that this may take some time.      Current medications:  Current Outpatient Medications   Medication Sig Dispense Refill   ??? albuterol HFA 90 mcg/actuation inhaler Inhale 2 puffs every four (4) hours as needed for wheezing. 3 Inhaler 3   ??? amLODIPine (NORVASC) 5 MG tablet Take 2 tablets (10 mg total) by mouth daily. 180 tablet 3   ??? cetirizine (ZYRTEC) 10 MG tablet Take 10 mg by mouth Two (2) times a day.      ??? citalopram (CELEXA) 40 MG tablet Take 1 tablet (40 mg total) by mouth daily. 90 tablet 3   ??? clobetasol (TEMOVATE) 0.05 % cream Apply topically Two (2) times a day. 60 g 0   ??? docusate sodium (COLACE) 100 MG capsule Take 1 capsule (100 mg total) by mouth Two (2) times a day. 180 capsule 3   ??? fluconazole (DIFLUCAN) 200 MG tablet Take 400 mg on day 1. Then take 200 mg daily for 13 days. 15 tablet 0   ??? gabapentin (NEURONTIN) 300 MG capsule Take 100 mg by mouth Three (3) times a day.     ??? hydrOXYzine (ATARAX) 10 MG tablet Take 10 mg by mouth Three (3) times a day as needed for itching.     ??? levothyroxine (SYNTHROID, LEVOTHROID) 75 MCG tablet Take 1 tablet (75 mcg total) by mouth daily. 30 tablet 11   ??? nicotine (NICODERM CQ) 21 mg/24 hr patch Place 1 patch on the skin daily. 1 nicotine patch (21 mg/day) daily for six weeks 42 patch 0   ??? omeprazole (PRILOSEC) 20 MG capsule Take 1 capsule (20 mg total) by mouth daily. 90 capsule 3   ??? predniSONE (DELTASONE) 20 MG tablet Take 3 tablets (60 mg total) by mouth daily. 168 tablet 0   ??? triamcinolone (KENALOG) 0.1 % ointment Apply twice a day to affected areas 454 g 6   ??? levothyroxine (SYNTHROID) 75 MCG tablet Take 1 tablet (75 mcg total) by mouth daily. (Patient not taking: Reported on 12/04/2017) 30 tablet 11   ??? predniSONE (DELTASONE) 20 MG tablet Take 1 tablet (20 mg total) by mouth daily. Take 60 mg daily (Patient not taking: Reported on 12/04/2017) 90 tablet 0   ??? traMADol (ULTRAM) 50 mg tablet Take 1 tablet (50 mg total) by mouth every eight (8) hours as needed for pain. (Patient not taking: Reported on 11/27/2017) 60 tablet 0   ??? triamcinolone (KENALOG) 0.1 % ointment Apply twice a day to affected areas (Patient not taking: Reported on 11/27/2017) 454 g 6     No current facility-administered medications for this visit.  Following topics were discussed during counseling:    1. Indications for medication, dosage and administration.     A. Epclusa 400/100mg  1 tablet to take daily with food.    2. Common side effects of medications and management strategies. (fatigue, headache)    3. Importance of adherence to regimen, follow-up clinic visits and lab monitoring.     A.Asked patient to call [facility] [phone] before starting treatment to establish start date and to schedule TW#4 appointment.    4. Drug-drug interaction.    A. Current medications have been reviewed and assessed for possible interaction.      - Omeprazole 20mg : We discussed the mechanism of drug-drug interaction with acid lowering agents. Dorita Fray should be taken with food 4 hours prior to PPI (max dose omeprazole 20mg  equivalent). Pt plans to take Epclusa around 10:30am with food then omeprazole at 2:30pm.    B. Advised to check with MD or pharmacist before taking any OTC/herbal medications, with emphasis regarding indigestion/heartburn medications.  Denies use of herbal medication such as milk thistle or [facility] wart.  Allergies have been verified. Denies alcohol.    5. Importance of informing pharmacy and clinic of updated contact information.  Discussed the process of obtaining medication through specialty pharmacy and when approved medication will be delivered to patient's home. Stressed importance of being able to be reached over the phone.    Patient verbalized understanding. Provided contact information for any questions/concerns.       Park Breed, Pharm D., BCPS, BCGP, CPP  [facility]  [address]  [place], [place] [address]  [phone]    This portion of the visit was 15 minutes in duration and greater than 50% was spend in direct counseling and coordination of care regarding hepatitis C medication management.

## 2017-12-04 NOTE — Unmapped (Signed)
[facility] L[facility]    Alba Destine, M.D.  Professor of Medicine  Director, [facility]  [place] [facility] [place] [facility] [place]    [phone]    Referring MD: Deeann Cree, MD  [address]  [id number]  [place], [place] [address]    Reason For Office Visit: Evaluation and Treatment for Hepatitis C    History of Present Illness:  Marie Fletcher is a 62 y.o. female who presents for evaluation and treatment for chronic hepatitis C. She remembers first learning that she had hepatitis C about 15 years ago. She has never been treated. She reports that her husband also has hepatitis C, he has been treated for hepatitis C but she doesn't know what medication or if he achieved cure. 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 She denies any tattoos or unprofessional piercings. She reports that all of her children have been tested for hepatitis C and were found to be negative.     Her most recent HCV RNA was 2,060,054 on 09/12/17. She was found to be genotype 1a. She has not had hepatitis A/B testing. She was HIV negative 10/17/17. Had RUQ [place] 10/22/17 which showed normal appearing liver with no liver lesions present, did show evidence of cholecystectomy and normal CBD.     She was hospitalized at [facility] for new diagnosis bullous pemphigoid in 10/2017. Currently on 60 mg prednisone, had dropped to 50 mg prednisone but increased back up to 60 mg prednisone given pruritus. Has gained 20 lbs in last 45 days as well. Also had thrush (confirmed Candida on EGD last week). Dermatology here at [facility] considering other steroid sparing agents as patient having trouble tapering off prednisone. She initially had big blistering lesions on her arms/legs/chest - these popped and were incredibly painful. Now she is left with hyperpigmented lesions and a few small blisters on her chest.     Review of Systems:  10 point ROS performed and negative unless documented in HPI.     Liver Care:   1. Chronic hepatitis C, treatment naive, genotype 1a.   Baseline HCV RNA 2,060,054 IU/ml on 09/12/17  2. Immunity hepatitis A: unknown, checking today  3. Immunity hepatitis B: unknown, checking today  4. HIV status: negative 10/17/17  5. FibroScan: 6.6 KPS c/w stage F1 mild fibrosis  6. Abd [place] with normal liver, no masses 10/22/17    PMH/PSH:  Past Medical History:   Diagnosis Date   ??? A-fib (CMS-HCC)    ??? Arthritis    ??? Asthma    ??? Coronary artery disease    ??? Disease of thyroid gland    ??? Heart attack (CMS-HCC) 1998   ??? Hepatitis C    ??? History of transfusion    ??? Hypertension    ??? Stroke (CMS-HCC)      Past Surgical History:   Procedure Laterality Date   ??? appercanes removal  1984   ??? CHOLECYSTECTOMY OPEN     ??? COLON SURGERY     ??? GASTRIC BYPASS  1995   ??? GASTROPLASTY VERTICAL BANDED  1992   ??? HERNIA REPAIR     ??? HYSTERECTOMY     ??? PR UPPER GI ENDOSCOPY,BIOPSY N/A 05/21/2016    Procedure: UGI ENDOSCOPY; WITH BIOPSY, SINGLE OR MULTIPLE;  Surgeon: Rona Ravens, MD;  Location: GI PROCEDURES [facility];  Service: Gastroenterology   ???  PR UPPER GI ENDOSCOPY,DIAGNOSIS N/A 11/27/2017    Procedure: UGI ENDO, INCLUDE ESOPHAGUS, STOMACH, & DUODENUM &/OR JEJUNUM; DX W/WO COLLECTION SPECIMN, BY BRUSH OR WASH;  Surgeon: Janyth Pupa, MD;  Location: [facility];  Service: Gastroenterology   ??? SKIN BIOPSY         Allergies   Allergen Reactions   ??? Penicillins Anaphylaxis and Rash   ??? Sulfa (Sulfonamide Antibiotics) Anaphylaxis   ??? Sulfasalazine Anaphylaxis   ??? Lavender      Sinus swelling, nausea   ??? Codeine Nausea Only     Other reaction(s): OTHER       Current Medications:  Current Outpatient Medications   Medication Sig Dispense Refill   ??? albuterol HFA 90 mcg/actuation inhaler Inhale 2 puffs every four (4) hours as needed for wheezing. 3 Inhaler 3   ??? amLODIPine (NORVASC) 5 MG tablet Take 2 tablets (10 mg total) by mouth daily. 180 tablet 3   ??? cetirizine (ZYRTEC) 10 MG tablet Take 10 mg by mouth Two (2) times a day.      ??? citalopram (CELEXA) 40 MG tablet Take 1 tablet (40 mg total) by mouth daily. 90 tablet 3   ??? clobetasol (TEMOVATE) 0.05 % cream Apply topically Two (2) times a day. 60 g 0   ??? docusate sodium (COLACE) 100 MG capsule Take 1 capsule (100 mg total) by mouth Two (2) times a day. 180 capsule 3   ??? fluconazole (DIFLUCAN) 200 MG tablet Take 400 mg on day 1. Then take 200 mg daily for 13 days. 15 tablet 0   ??? gabapentin (NEURONTIN) 300 MG capsule Take 100 mg by mouth Three (3) times a day.     ??? hydrOXYzine (ATARAX) 10 MG tablet Take 10 mg by mouth Three (3) times a day as needed for itching.     ??? levothyroxine (SYNTHROID, LEVOTHROID) 75 MCG tablet Take 1 tablet (75 mcg total) by mouth daily. 30 tablet 11   ??? nicotine (NICODERM CQ) 21 mg/24 hr patch Place 1 patch on the skin daily. 1 nicotine patch (21 mg/day) daily for six weeks 42 patch 0   ??? predniSONE (DELTASONE) 20 MG tablet Take 3 tablets (60 mg total) by mouth daily. 168 tablet 0   ??? triamcinolone (KENALOG) 0.1 % ointment Apply twice a day to affected areas 454 g 6   ??? levothyroxine (SYNTHROID) 75 MCG tablet Take 1 tablet (75 mcg total) by mouth daily. (Patient not taking: Reported on 12/04/2017) 30 tablet 11   ??? omeprazole (PRILOSEC) 20 MG capsule Take 1 capsule (20 mg total) by mouth daily. 90 capsule 3   ??? predniSONE (DELTASONE) 20 MG tablet Take 1 tablet (20 mg total) by mouth daily. Take 60 mg daily (Patient not taking: Reported on 12/04/2017) 90 tablet 0   ??? traMADol (ULTRAM) 50 mg tablet Take 1 tablet (50 mg total) by mouth every eight (8) hours as needed for pain. (Patient not taking: Reported on 11/27/2017) 60 tablet 0   ??? triamcinolone (KENALOG) 0.1 % ointment Apply twice a day to affected areas (Patient not taking: Reported on 11/27/2017) 454 g 6     No current facility-administered medications for this visit.        Social History Lives in [place] with her husband. Smokes 5 cigarettes per day (previously 3 ppd). Very rare alcohol use. No illicit drug use. Works at [employer] in [employer].   Social History     Tobacco Use   ??? Smoking  status: Current Some Day Smoker     Packs/day: 0.25     Types: Cigarettes   ??? Smokeless tobacco: Never Used   Substance Use Topics   ??? Alcohol use: No   ??? Drug use: No       Family History:  Family History   Problem Relation Age of Onset   ??? No Known Problems Mother    ??? No Known Problems Father    ??? No Known Problems Sister    ??? No Known Problems Brother    ??? No Known Problems Maternal Aunt    ??? No Known Problems Maternal Uncle    ??? No Known Problems Paternal Aunt    ??? No Known Problems Paternal Uncle    ??? No Known Problems Maternal Grandmother    ??? No Known Problems Maternal Grandfather    ??? No Known Problems Paternal Grandmother    ??? No Known Problems Paternal Grandfather    ??? Melanoma Neg Hx    ??? Basal cell carcinoma Neg Hx    ??? Squamous cell carcinoma Neg Hx    ??? Anesthesia problems Neg Hx    ??? Broken bones Neg Hx    ??? Cancer Neg Hx    ??? Clotting disorder Neg Hx    ??? Collagen disease Neg Hx    ??? Diabetes Neg Hx    ??? Dislocations Neg Hx    ??? Fibromyalgia Neg Hx    ??? Gout Neg Hx    ??? Hemophilia Neg Hx    ??? Osteoporosis Neg Hx    ??? Rheumatologic disease Neg Hx    ??? Scoliosis Neg Hx    ??? Severe sprains Neg Hx    ??? Sickle cell anemia Neg Hx    ??? Spinal Compression Fracture Neg Hx          Physical Examination:  Vitals:    12/04/17 0934   BP: 159/93   Pulse: 84   Resp: 20   Temp: 36.9 ??C   SpO2: 95%       Physical Examination: General appearance - oriented to person, place, and time and chronically ill appearing  Mental status - alert, oriented to person, place, and time  Eyes - sclera anicteric, EOMI  Mouth - mucous membranes moist, pharynx normal without lesions and no thrush, upper dentures in place  Chest - wheezing noted throughout, no increased work of breathing  Heart - normal rate and regular rhythm, S1 and S2 normal Abdomen - soft, nontender, nondistended, no masses or organomegaly  Extremities - warm, no edema, clubbing of nails noted  Skin - hyperpigmented lesions on inner arms, legs, a few bullae on chest     Lab:  Results for orders placed or performed in visit on 12/04/17   Urinalysis with Culture Reflex   Result Value Ref Range    Color, UA Yellow     Clarity, UA Clear     Specific Gravity, UA 1.009 1.003 - 1.030    pH, UA 6.0 5.0 - 9.0    Leukocyte Esterase, UA Negative Negative    Nitrite, UA Negative Negative    Protein, UA Trace (A) Negative    Glucose, UA Negative Negative    Ketones, UA Negative Negative    Urobilinogen, UA 0.2 mg/dL 0.2 mg/dL, 1.0 mg/dL    Bilirubin, UA Negative Negative    Blood, UA Negative Negative    RBC, UA 1 <=4 /HPF    WBC, UA 1 0 - 5 /HPF  Squam Epithel, UA 1 0 - 5 /HPF    Bacteria, UA None Seen None Seen /HPF    Mucus, UA Rare (A) None Seen /HPF   Comprehensive Metabolic Panel   Result Value Ref Range    Sodium 137 135 - 145 mmol/L    Potassium 4.2 3.5 - 5.0 mmol/L    Chloride 97 (L) 98 - 107 mmol/L    CO2 30.0 22.0 - 30.0 mmol/L    BUN 17 7 - 21 mg/dL    Creatinine 1.61 0.96 - 1.00 mg/dL    BUN/Creatinine Ratio 18     EGFR MDRD Non Af Amer 61 >=60 mL/min/1.41m2    EGFR MDRD Af Amer 75 >=60 mL/min/1.15m2    Anion Gap 10 9 - 15 mmol/L    Glucose 113 65 - 179 mg/dL    Calcium 9.5 8.5 - 04.5 mg/dL    Albumin 4.2 3.5 - 5.0 g/dL    Total Protein 7.4 6.5 - 8.3 g/dL    Total Bilirubin 0.6 0.0 - 1.2 mg/dL    AST 22 14 - 38 U/L    ALT 18 15 - 48 U/L    Alkaline Phosphatase 74 38 - 126 U/L   PT-INR   Result Value Ref Range    PT 9.5 (L) 10.2 - 12.8 sec    INR 0.83    CBC   Result Value Ref Range    WBC 12.4 (H) 4.5 - 11.0 10*9/L    RBC 4.94 4.00 - 5.20 10*12/L    HGB 11.6 (L) 12.0 - 16.0 g/dL    HCT 40.9 81.1 - 91.4 %    MCV 79.2 (L) 80.0 - 100.0 fL    MCH 23.5 (L) 26.0 - 34.0 pg    MCHC 29.6 (L) 31.0 - 37.0 g/dL    RDW 78.2 (H) 95.6 - 15.0 %    MPV 8.1 7.0 - 10.0 fL    Platelet 314 150 - 440 10*9/L   Hepatitis B Core Antibody, total   Result Value Ref Range    Hep B Core Total Ab Reactive (A) Nonreactive   Hepatitis B Surface Antibody   Result Value Ref Range    Hep B S Ab Reactive (A) Nonreactive, Grayzone    Hepatitis B Surface Ab Quant 173.00 (H) <8.00 m(IU)/mL   Hepatitis B Surface Antigen   Result Value Ref Range    Hepatitis B Surface Ag Nonreactive Nonreactive   Hepatitis A IgG   Result Value Ref Range    Hepatitis A IgG Nonreactive Nonreactive   Rheumatoid factor   Result Value Ref Range    Rheumatoid Factor 18.2 (H) 0.0 - 15.0 IU/mL       Assessment:   Marie Fletcher is a 61 y.o. with treatment naive chronic hepatitis C, genotype 1a who presents to discuss treatment options. Today we discussed the natural history of HCV infection, including the risks for progression to cirrhosis, liver failure, liver cancer, and risks of hepatocellular carcinoma. Patient is aware of the need for continued follow-up and monitoring.  We discussed the importance of remaining abstinent from alcohol due to additive effects on disease progression to cirrhosis. We discussed potential treatment options that include all-oral regimens with low rates of side effects and high rates of cure (sustained virological response). We will have patient meet with pharmacist today to discuss treatment options as she will have to apply for manufacturer assistance given lack of insurance.     Her Fibroscan today confirms that she  does not have any evidence of cirrhosis or advanced fibrosis so there are no contraindications to the use of methotrexate or other steroid sparing agents mentioned by Dermatology given her history of hepatitis C. Her hepatitis B status should be confirmed in the event that other immunosuppressing medications are needed.     Plan:   1. Chronic hepatitis C: Treatment naive genotype 1a with most recent HCV RNA 1,610,960. Recommend initiate treatment for her hepatitis C now (even in the setting of high dose corticosteroids) rather than waiting for her to be transitioned over to a steroid sparing agent. We have treated many post-transplant patients for hepatitis C and have seen good treatment response despite potent immunosuppressants. She will meet with pharmacist today to discuss options. We will check CBC, CMP, INR today and also obtain Fibroscan in clinic.     2. Rash: Following with Dermatology for history of both bullous pemphigoid and hidradenitis suppurativa. Maintained on high dose corticosteroids with plan to transition to steroid sparing agent. As above, okay to use any of the agents listed by Dermatology including MTX. Rash does not look consistent with cryoglobulinemia but will check RF and cryoglobulins today for baseline. ADDENDUM: RF IS POSITIVE, CRYOS NOT REPORTED. REPEAT NEXT VISIT    3. Hepatitis A status: Unknown so will check Hep A IgG today. ADDENDUM- NEEDS HAV VACCINE NEXT VISIT    4. Hepatitis B status: Unknown so will check HBsAg, HBcAb total, HBsAb today. This will be important to know in the event that patient requires other immunosuppressive therapies in the future including rituximab.  HBS-AG IS NEGATIVE BUT HAS PRIOR EXPOSURE TO HBV (HBCOREAB+). WOULD NEED HBV PROPHYLAXIS IF AGENTS SUCH AS RITUXIMAB CONSIDERED IN THE FUTURE.     5. Dysuria: Will check UA with reflex culture today given her symptoms.     6. Rare alcohol use: Counseled on need for complete alcohol cessation given hepatitis C infection as well as possible initiation of MTX/other immunosuppressants.     Patient was seen and discussed with Dr. Foy Guadalajara who is in agreement with above assessment and plan.    Mardee Postin, M.D.  Gastroenterology Fellow    I have seen and examined this patient with the Resident.  I have interviewed and examined the patient, discussed the findings with the Resident, and discussed the plan of care.  I agree with the assessment and plan as described.     Alba Destine, M.D.  Professor of Medicine  Director, [facility]  [place] [facility] [place] at [facility]

## 2017-12-04 NOTE — Unmapped (Addendum)
[facility]  FAST ??? Fibrosis Assessment Team  Division of Gastroenterology and Hepatology  ??  ??    ??  FIBROSCAN will be performed to assess hepatic fibrosis (scarring) in order to stage this patient's liver disease. This will assist with evaluating the natural course of the disease and will provide important information regarding prognosis, duration of therapy, and potential response to treatment. This information will also help assess risk for hepatocellular carcinoma and need for liver cancer surveillance.??  ??  FibroscanProcedure:   After obtaining verbal consent, the patient was placed in a supine position. Physical characteristics and landmarks were assessed to establish appropriate mid-axillary intercostal space for probe placement. 50Hz  Shear Wave pulses were applied and the resulting Shear Wave and Propagation Speed detected with a 3.5 MHz ultrasonic signal, using the FibroScan probe.  Skin to liver capsule distance and liver parenchyma were accessed during the entire examination with the FibroScan probe. The patient was instructed to breathe normally and to abstain from sudden movements during the procedure, allowing for random measurements of liver stiffness. At least ten Shear Waves were produced; individual measurements of each Shear Wave were calculated. Patient tolerated the procedure well and was discharged without incident.  ??  Probe used []  M+    Serial # [id number]                         [x]  XL+   Serial # [id number]  ??  Main Etiology of Liver Disease:  [x] HCV     [] HBV   [] Alcohol    [] NASH  [] PBC      [] PSC     [] Other________________  ??  50Hz  shear wave pulses were applied and the resulting shear wave and propagation speed detected with a 3.5 MHz ultrasonic signal, using the FibroScan probe.     ??  At least ten Shear Waves were produced; individual measurements of each shear wave were calculated.    ??  Patient tolerated the procedure well and was discharged without incident.  ??  Fibroscan score: ___6.6___kPa  ??  IQR:                       ___9___%  ??  Test performed by: Luiz Ochoa, RN  ??  [facility]  FAST ??? Fibrosis Assessment Team  Division of Gastroenterology and Hepatology  ??  ??    ??  ??  ??  Estimation of the stage of liver fibrosis (Metavir Score):  The results of the Liver Stiffness Score are consistent with the following liver fibrosis stage:  ??  ??  [x]  F0-F1             []  F2               []  F3               []  F4  ??  ??  GENERAL RECOMMENDATIONS ACCORDING TO THE STAGE OF LIVER FIBROSIS.  ??  F0-F1: No-minimal fibrosis. The risk of progression to advanced fibrosis and cirrhosis is low. If the cause of liver disease is not removed, a 1-2 yr follow-up study is recommended.   F2: Significant fibrosis. There is a moderate risk of progression to cirrhosis. If the cause of liver disease is not removed, a follow-up study in 12 months is recommended.  F3: Advanced (pre-cirrhotic stage). The risk of progression to cirrhosis is high. Imaging studies to rule out hepatocellular carcinoma  should be considered. Efforts to remove the cause of liver disease are highly recommended.  F4: Cirrhosis. There is significant risk of portal hypertension and esophageal varices. An upper endoscopy is recommended. Imaging studies for hepatocellular carcinoma screening are recommended.   ??  Any and all FibroScan studies must be carefully evaluated, taking fully into account all individual measurement/scans, patient history and other factors.  As with liver biopsy, any estimation of liver fibrosis may be subject to under or over staging due to sampling error.  Any further medical or surgical intervention should be made only while fully considering the circumstances of this patient and in consultation with this patient.    I have reviewed and interpreted the FIBROSCAN test results as described above.

## 2017-12-05 LAB — HEPATITIS B SURFACE ANTIBODY
HEPATITIS B SURFACE ANTIBODY: REACTIVE — AB
Hepatitis B virus surface Ab:PrThr:Pt:Ser:Ord:: REACTIVE — AB

## 2017-12-05 LAB — HEPATITIS B SURFACE ANTIGEN: Hepatitis B virus surface Ag:PrThr:Pt:Ser:Ord:: NONREACTIVE

## 2017-12-05 LAB — HEPATITIS A IGG: Hepatitis A virus Ab.IgG:PrThr:Pt:Ser:Ord:: NONREACTIVE

## 2017-12-05 LAB — HEPATITIS B CORE TOTAL ANTIBODY: Hepatitis B virus core Ab:PrThr:Pt:Ser/Plas:Ord:IA: REACTIVE — AB

## 2017-12-06 MED ORDER — HYDROXYZINE HCL 25 MG TABLET
ORAL_TABLET | Freq: Three times a day (TID) | ORAL | 3 refills | 0 days | PRN
Start: 2017-12-06 — End: 2017-12-23

## 2017-12-06 NOTE — Unmapped (Signed)
Atarax refilled to preferred pharmacy.

## 2017-12-07 NOTE — Unmapped (Deleted)
Dermatology Clinic Note     Assessment and Plan:    Bullous pemphigoid:   Today, ***   She has no active blisters today, but has persistent pruritus on prednisone 60 mg daily. Long term treatment options are limited by patient's history of untreated hepatitis C.   - Since starting prednisone, has had many side effects such as weight gain, shakiness. Goal will be to transition to another immunosuppressant. Patient undergoing treatment with GI for hepatitis C. Per recent note, there is no evidenceo f fibrosis or cirrhosis, thus approved to treat with agents such as methotrexate.   -These medications include methotrexate, minocycline, Imuran, Cellcept, and Xolair.   - Start Zyrtec 10 mg BID  - Continue hydroxyzine 25 mg q8 hrs PRN  - Okay to stop gabapentin  - Start triamcinolone ointment BID PRN    Hidradenitis suppurativa: Not addressed today.  - Previously had surgery in both axillae. Typically quiescent, but she was prescribed a 3 month course of doxycycline at last visit for flare at that time.    RTC: No follow-ups on file.      CC:   No chief complaint on file.    HPI:  This is a pleasant 62 y.o. female who is seen today for rash follow-up, last seen by Dr. Orma Flaming 5/19 for BP.     Patient was recently admitted to the [facility] for bullous pemphigoid and was discharged on 5/2 with a prednisone taper starting at 60 mg daily. She currently remains on 60 mg of prednisone daily. Her lisonopril/HCTZ was also discontinued by her PCP due to concern that it caused her BP. During her admission, she was initially treated with Valtrex for presumed shingles, but a skin biopsy was performed and instead confirmed diagnosis of bullous pemphigoid. She is taking hydrozyzine 25 mg q8 hrs PRN for itch and also gabapentin 100 mg BID.     Of note, she has a history of untreated hepatitis C. She has an appt with GI in about 2 weeks. She states that she has a viral load, but it is low.     Additionally, she has a history of HS s/p excision in axillae with Plastic Surgery many years ago. It is typically quiescent, but was flaring at last visit. She was prescribed a 3 month course of doxycycline.    She denies other new, changing, non-healing, tender, or bleeding lesions.  No other skin concerns.    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  Neuro: Answers questions appropriately.  Skin: Examination of the head, neck, chest, abdomen, back bilateral upper and proximal lower extremities,was performed and notable for the below. All other areas examined were normal or had no significant findings.   - Numerous erythematous to hyperpigmented macules, some with overlying scale, located diffusely on the trunk and extremities. Few areas of hemorrhagic crusting. No intact bullae.  - No erosions of oral mucosa  - All  other areas examined were normal or no significant findings.    The patient was seen and examined by Inis Sizer, MD who agrees with the assessment and plan as above.

## 2017-12-09 LAB — CRYOGLOBULIN: Cryoglobulin/Serum.total:VFr:Pt:Ser:Qn:Spun Westergren tube: NEGATIVE

## 2017-12-09 MED ORDER — EPCLUSA 400 MG-100 MG TABLET
Freq: Every day | ORAL | 2 refills | 0.00000 days | Status: CP
Start: 2017-12-09 — End: 2018-04-30

## 2017-12-09 MED ORDER — EPCLUSA 400 MG-100 MG TABLET: 1 | each | 2 refills | 0 days

## 2017-12-12 NOTE — Unmapped (Signed)
Pt called, sounded upset, states she has been sick and sorry she missed her appointment asking for a call back

## 2017-12-18 NOTE — Unmapped (Signed)
Patient has been approved to receive Epclusa from the manufacturer from  12/18/17 until 03/12/18 and will be shipped directly to the patient's home.

## 2017-12-19 NOTE — Unmapped (Signed)
Initial Counseling for HCV Treatment     Planned regimen: Epclusa (sofosbuvir/velpatasvir 400/100mg ) x 12 weeks  Planned start date: 12/24/17 (planned delivery 12/23/17)    Pharmacy: [facility]-[phone]    PMH:   Past Medical History:   Diagnosis Date   ??? A-fib (CMS-HCC)    ??? Arthritis    ??? Asthma    ??? Coronary artery disease    ??? Disease of thyroid gland    ??? Heart attack (CMS-HCC) 1998   ??? Hepatitis C    ??? History of transfusion    ??? Hypertension    ??? Stroke (CMS-HCC)      Current medications: Levothyroxine, prednisone (taper), citalopram, gabapentin, Zyrtec, Tylenol, omeprazole 20mg     Patient is ready to start treatment with Epclusa.     Following topics were discussed during counseling:   Patient Counseling    Counseled the patient on the following:  doses and administration discussed, possible adverse effects and management discussed, possible drug and prescription drug interactions discussed, possible drug and OTC drug and food interactions discussed, lab monitoring and follow-up discussed, therapeutic rationale discussed, adherence and missed doses discussed, pharmacy contact information discussed          1. Indications for medication, dosage and administration.     A. Epclusa 400/100mg  1 tablet to take daily with food. (Must be with food if taking PPI) Patient plans to take Epclusa at 10am with her lunch and omeprazole 20mg  at 2pm.     2. Common side effects of medications and management strategies (fatigue, headache). Pt notes taking Tylenol ER (2 tablets/day=1,000mg /day) as needed for pain, advised pt to not exceed 2,000mg /day of Tylenol for pain/headaches. Pt verbalized understanding.        3. Importance of adherence to regimen, follow-up clinic visits and lab monitoring. Recommended pt use an alarm to remind pt of10am Epclusa dose with food and 2pm omeprazole dose daily.  Medication Adherence    Specialty Medication:  Epclusa  Patient is on additional specialty medications:  No  Patient is on more than two specialty medications:  No  Demonstrates understanding of importance of adherence:  yes  Informant:  patient  Patient is at risk for Non-Adherence:  No  Reasons for non-adherence:  no problems identified  Adherence tools used:  alarm  Confirmed plan for next specialty medication refill:  delivery by pharmacy       A. Asked patient to call [facility] [phone] to establish start date for treatment and to schedule appointment 4 weeks before starting treatment.      4. Drug-drug interaction.  Drug Interactions    Drug interactions evaluated:  yes  Clinically relevant drug interactions identified:  yes   Interactions list:  Omeprazole 20mg : may decrease efficacy of Epclusa   Drug management plan:  Dorita Fray should be taken with food 4 hours prior to PPI (max dose omeprazole 20mg  equivalent). Pt plans to take Epclusa around 10am with food then omeprazole at 2pm.   Provided the patient with educational material regarding drug interactions:  not applicable       A. Current medications have been reviewed and assessed for possible interaction.  We discussed the mechanism of drug-drug interaction with acid lowering agents. Eplcusa should be administered with food and taken 4 hours before PPI (omeprazole 20mg  equivalent). Advised pt to ensure she does not exceed 20mg /day of omeprazole. Advised to check with MD or pharmacist before taking any OTC/herbal medications, with emphasis regarding indigestion/heartburn medications.  Denies use of herbal  medication such as milk thistle or St. John's wart.  Allergies have been verified. Denies alcohol consumption.     5. Importance of informing pharmacy and clinic of updated contact information. Advised patient to call pharmacy when down to about 7 day supply left to ensure there's no interruption in therapy.  Pt confirmed she had [facility]'s phone number saved in her phone.       Patient verbalized understanding. Provided contact information for any questions/concerns.     Casimer Bilis, PharmD Candidate  December 20, 2017 10:22 AM    Park Breed, Pharm D., BCPS, BCGP, CPP  [facility]  [address]  [place], [place] [address]  [phone]

## 2017-12-23 ENCOUNTER — Ambulatory Visit: Admit: 2017-12-23 | Discharge: 2017-12-24

## 2017-12-23 DIAGNOSIS — L120 Bullous pemphigoid: Principal | ICD-10-CM

## 2017-12-23 MED ORDER — HYDROXYZINE HCL 25 MG TABLET
ORAL_TABLET | Freq: Three times a day (TID) | ORAL | 3 refills | 0 days | Status: CP | PRN
Start: 2017-12-23 — End: 2018-06-23

## 2017-12-23 MED ORDER — PREDNISONE 10 MG TABLET
ORAL_TABLET | 0 refills | 0 days | Status: CP
Start: 2017-12-23 — End: 2018-03-10

## 2017-12-23 NOTE — Unmapped (Signed)
Dermatology Clinic Note     Assessment and Plan:    Bullous pemphigoid: Discharged from [facility] on 10/24/2017 with new diagnosis of bullous pemphigoid, supported by H&E and DIF biopsy findings. Long term treatment options are limited by patient's history of hepatitis C pending treatment. Patient reports she is to start treatment today for hepatitis C.  - Will attempt to taper prednisone as follows: increase prednisone to 50mg  daily for 1 week, and decrease dose by 5mg  weekly, refills sent  - we counseled patient to please call us if she flares or has any concern for side effects and we can counsel her on next steps. Counseled patient that tapering medication too rapidly may cause blisters to form.   - Patient has a history of untreated hepatitis C with positive viral load pending treatment, we counseled patient that we will consider addition of systemic steroids sparing agent when her hepatitis C has been treated, for now will attempt to gradually taper prednisone.   - Continue hydroxyzine 25 mg q8 hrs PRN    RTC: Return in about 1 month (around 01/23/2018).      CC:   Chief Complaint   Patient presents with   ??? Follow-up     F/U Bullous Pemphigoid     HPI:  This is a pleasant 62 y.o. female who is seen today for hospital follow-up, last seen by Dr. Fayrene Fearing in 11/11/2017. Since LV, she notes she was initially on pred 60 for 1 week, but had a few breakouts so she self increased the dose to 80mg  for two weeks. Skin then cleared, and she decreased dose back to 60mg  for 5 days daily, then 50mg  for a few days, and yesterday she took 30mg . She has not taken a dose today. Currently has no blisters but does have a few itchy spots. She repots that she is about to start treatment for hepatitis C today, and medication is supposed to arrive in the mail today.      History- Patient was admitted to the [facility] for bullous pemphigoid and was discharged on 5/2 with a prednisone taper starting at 60 mg daily. Her lisonopril/HCTZ was also discontinued by her PCP due to concern that it caused her BP. During her admission, she was initially treated with Valtrex for presumed shingles, but a skin biopsy was performed and instead confirmed diagnosis of bullous pemphigoid. She has a history of untreated hepatitis C.    She denies other new, changing, non-healing, tender, or bleeding lesions.  No other skin concerns.    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  Neuro: Answers questions appropriately.  Skin: Examination of the head, neck, chest, abdomen, back bilateral upper and proximal lower extremities,was performed and notable for the below. All other areas examined were normal or had no significant findings.   - Numerous erythematous to hyperpigmented macules, some with overlying scale, located diffusely on the trunk and extremities. No erosions, hemorrhagic crusting, or intact bullae.  - No erosions of oral mucosa  - All other areas examined were normal or no significant findings.

## 2017-12-23 NOTE — Unmapped (Signed)
Week of 7/1= take 50mg  prednisone daily  Week of 7/8= take 45mg  prednisone daily  Week of 7/15= take 40mg  prednisone daily  Week of 7/22= take 35mg  prednisone daily    If you flare, call us or send a mychart message     We will wait to think about methotrexate until your hepatitis c is treated

## 2018-01-07 NOTE — Unmapped (Addendum)
Follow-Up Counseling for HCV Treatment      Regimen: Epclusa (sofosbuvir/velpatasvir 400/100mg ) x 12 weeks  Start Date: 12/24/17  Completed Treatment Week #1    Pharmacy: [facility] [phone]      Following topics were reviewed during the phone call:  Patient Counseling    Counseled the patient on the following:  doses and administration discussed, possible adverse effects and management discussed, possible drug and prescription drug interactions discussed, possible drug and OTC drug and food interactions discussed, lab monitoring and follow-up discussed, therapeutic rationale discussed, adherence and missed doses discussed, pharmacy contact information discussed         1. Medication administration - Takes Epclusa daily at 10:00 am with lunch.     2. Importance of adherence -   Medication Adherence    Patient reported X missed doses in the last month:  0  Specialty Medication:  Epclusa  Patient is on additional specialty medications:  No  Patient is on more than two specialty medications:  No  Any gaps in refill history greater than 2 weeks in the last 3 months:  no  Demonstrates understanding of importance of adherence:  yes  Informant:  patient  Reliability of informant:  reliable  Provider-estimated medication adherence level:  90-100%  Patient is at risk for Non-Adherence:  Yes  The following intervention(s) were discussed with the patient:  Cell phone, Pill boxes, Daily routines, Medication placed where it can be noticed  Reasons for non-adherence:  patient forgets  Adherence tools used:  alarm  Confirmed plan for next specialty medication refill:  outside pharmacy       Pill count over the phone revealed #12 tablets which is inappropriate. Pill count should be 13 after today's dose. Pt stated one day she was not sure if she took the [place] one day, so she thinks she took a extra dose. Discussed getting a pill box to keep her on track. Also discussed setting her phone alarm.     3. Side effects - Pt reports feeling shaky about 15 min after she takes the medication. Pt reports the symptoms last for aproximately 20-30 min and then goes away. Pt reports a little fatigue and would like to start taking her Vit B 12 again-500 mcg daily. Pt reports drinking plenty of water throughout the day.   Adverse Effects    Fatigue:  Pos         4. Drug-drug interaction -  Pt stated she has not used any Omeprazole since starting tx. Pt is aware to take Omeprazole 2Pm if she needs to take it. Pt stated she had a sip of a hard rock strawberry drink, but that is all. Stressed the importance of remaining abstinent while on tx. After discussion with Erskine Squibb, CPP pt was advised she may start taking Vit B12 500 mcg daily.   Drug Interactions    Drug interactions evaluated:  yes  Clinically relevant drug interactions identified:  yes   Interactions list:  Omeprazole 20mg : may decrease efficacy of Epclusa   Drug management plan:  Dorita Fray should be taken with food 4 hours prior to PPI (max dose omeprazole 20mg  equivalent). Pt plans to take Epclusa around 10am with food then omeprazole at 2pm.          5. Follow up - Has follow up appointment scheduled in HCV treatment clinic on 01/28/18 @ 1230 with Owens Shark, DNP.  Advised patient to call pharmacy when down to about 7 day supply left to ensure there's  no interruption in therapy.  All questions were answered.      Vertell Limber RN, [facility]   Pharmacy Adult GI Medicine  [facility]  [address]   [place], [place] [address]  [phone]    January 07, 2018 11:12 AM

## 2018-01-20 ENCOUNTER — Ambulatory Visit: Admit: 2018-01-20 | Discharge: 2018-01-21

## 2018-01-20 DIAGNOSIS — L120 Bullous pemphigoid: Principal | ICD-10-CM

## 2018-01-20 MED ORDER — DOXYCYCLINE MONOHYDRATE 100 MG CAPSULE
ORAL_CAPSULE | Freq: Two times a day (BID) | ORAL | 2 refills | 0.00000 days | Status: CP
Start: 2018-01-20 — End: 2018-02-19

## 2018-01-20 MED ORDER — GABAPENTIN 300 MG CAPSULE
ORAL_CAPSULE | Freq: Three times a day (TID) | ORAL | 3 refills | 0 days | Status: CP
Start: 2018-01-20 — End: 2018-02-19

## 2018-01-20 NOTE — Unmapped (Signed)
Addended by: Inis Sizer A on: 01/20/2018 10:24 AM     Modules accepted: Level of Service

## 2018-01-20 NOTE — Unmapped (Addendum)
Start doxycycline 100 mg one tablet two times daily. Take with food.   Continue prednisone taper: decrease prednisone by 5 mg weekly.   Continue hydroxyzine 25 mg as needed every 6 hours.   Continue triamcinolone ointment twice daily as needed to the areas that are itchy.   Restart gabapentin three times daily.      Patient Education        doxycycline  Pronunciation:  DOX i SYE kleen  Brand:  Acticlate, Adoxa, Alodox, Avidoxy, Doryx, Mondoxyne NL, Monodox, Morgidox, Oracea, Oraxyl, Targadox, Vibramycin  What is the most important information I should know about doxycycline?  You should not take this medicine if you are allergic to any tetracycline antibiotic.  Children younger than 81 years old should use doxycycline only in cases of severe or life-threatening conditions. This medicine can cause permanent yellowing or graying of the teeth in children  Using doxycycline during pregnancy could harm the unborn baby or cause permanent tooth discoloration later in the baby's life.   What is doxycycline?  Doxycycline is a tetracycline antibiotic that fights bacteria in the body.  Doxycycline is used to treat many different bacterial infections, such as acne, urinary tract infections, intestinal infections, eye infections, gonorrhea, chlamydia, periodontitis (gum disease), and others.  Doxycycline is also used to treat blemishes, bumps, and acne-like lesions caused by rosacea. Doxycycline will not treat facial redness caused by rosacea.  Some forms of doxycycline are used to prevent malaria, to treat anthrax, or to treat infections caused by mites, ticks, or lice.  Doxycycline may also be used for purposes not listed in this medication guide.  What should I discuss with my healthcare provider before taking doxycycline?  You should not take this medicine if you are allergic to doxycycline or other tetracycline antibiotics such as demeclocycline, minocycline, tetracycline, or tigecycline.  Tell your doctor if you have ever had:  ?? liver disease;  ?? kidney disease;  ?? asthma or sulfite allergy;  ?? increased pressure inside your skull; or  ?? if you also take isotretinoin, seizure medicine, or a blood thinner such as warfarin (Coumadin).  If you are using doxycycline to treat gonorrhea, your doctor may test you to make sure you do not also have syphilis, another sexually transmitted disease.  Taking this medicine during pregnancy may affect tooth and bone development in the unborn baby. Taking doxycycline during the last half of pregnancy can cause permanent tooth discoloration later in the baby's life. Tell your doctor if you are pregnant or if you become pregnant.  Doxycycline can make birth control pills less effective. Ask your doctor about using a non-hormonal birth control (condom, diaphragm with spermicide) to prevent pregnancy.  Doxycycline can pass into breast milk and may affect bone and tooth development in a nursing infant. Do not breast-feed while you are taking doxycycline.  Doxycycline can cause permanent yellowing or graying of the teeth in children younger than 22 years old. Children should use doxycycline only in cases of severe or life-threatening conditions such as anthrax or [facility] spotted fever. The benefit of treating a serious condition may outweigh any risks to the child's tooth development.  How should I take doxycycline?  Follow all directions on your prescription label and read all medication guides or instruction sheets. Use the medicine exactly as directed.  Take doxycycline with a full glass of water. Drink plenty of liquids while you are taking doxycycline.  Read and carefully follow any Instructions for Use provided with your medicine. Ask your  doctor or pharmacist if you do not understand these instructions.  Most brands of doxycyline may be taken with food or milk if the medicine upsets your stomach.  Different brands of doxycycline may have different instructions about taking them with or without food.  Take Oracea on an empty stomach, at least 1 hour before or 2 hours after a meal.  You may need to split a doxycycline tablet to get the correct dose. Follow your doctor's instructions.  Swallow a delayed-release capsule or tablet whole. Do not crush, chew, break, or open it.  Measure liquid medicine  with the dosing syringe provided, or with a special dose-measuring spoon or medicine cup. If you do not have a dose-measuring device, ask your pharmacist for one.  If you take doxycycline to prevent malaria: Start taking the medicine 1 or 2 days before entering an area where malaria is common. Continue taking the medicine every day during your stay and for at least 4 weeks after you leave the area.  Use this medicine for the full prescribed length of time, even if your symptoms quickly improve. Skipping doses can increase your risk of infection that is resistant to medication. Doxycycline will not treat a viral infection such as the flu or a common cold.  Store at room temperature away from moisture, heat, and light.  Throw away any unused medicine after the expiration date on the label has passed. Using expired doxycycline can cause damage to your kidneys.  What happens if I miss a dose?  Take the medicine as soon as you can, but skip the missed dose if it is almost time for your next dose. Do not take two doses at one time.  What happens if I overdose?  Seek emergency medical attention or call the Poison Help line at [phone].  What should I avoid while taking doxycycline?  Do not take iron supplements, multivitamins, calcium supplements, antacids, or laxatives within 2 hours before or after taking doxycycline.  Avoid taking any other antibiotics with doxycycline unless your doctor has told you to.   Doxycycline could make you sunburn more easily. Avoid sunlight or tanning beds. Wear protective clothing and use sunscreen (SPF 30 or higher) when you are outdoors.  Antibiotic medicines can cause diarrhea, which may be a sign of a new infection. If you have diarrhea that is watery or bloody, call your doctor. Do not use anti-diarrhea medicine unless your doctor tells you to.  What are the possible side effects of doxycycline?  Get emergency medical help if you have signs of an allergic reaction (hives, difficult breathing, swelling in your face or throat) or a severe skin reaction (fever, sore throat, burning in your eyes, skin pain, red or purple skin rash that spreads and causes blistering and peeling).  Seek medical treatment if you have a serious drug reaction that can affect many parts of your body. Symptoms may include: skin rash, fever, swollen glands, flu-like symptoms, muscle aches, severe weakness, unusual bruising, or yellowing of your skin or eyes. This reaction may occur several weeks after you began using doxycycline.  Call your doctor at once if you have:  ?? severe stomach pain, diarrhea that is watery or bloody;  ?? throat irritation, trouble swallowing;  ?? chest pain, irregular heart rhythm, feeling short of breath;  ?? little or no urination;  ?? low white blood cell counts --fever, chills, swollen glands, body aches, weakness, pale skin, easy bruising or bleeding;  ?? increased pressure inside the skull --severe  headaches, ringing in your ears, dizziness, nausea, vision problems, pain behind your eyes; or  ?? signs of liver or pancreas problems --loss of appetite, upper stomach pain (that may spread to your back), tiredness, nausea or vomiting, fast heart rate, dark urine, jaundice (yellowing of the skin or eyes).  Common side effects may include:  ?? nausea, vomiting, upset stomach, loss of appetite;  ?? mild diarrhea;  ?? skin rash or itching;  ?? darkened skin color; or  ?? vaginal itching or discharge.  This is not a complete list of side effects and others may occur. Call your doctor for medical advice about side effects. You may report side effects to FDA at 1-800-FDA-1088.  What other drugs will affect doxycycline?  Sometimes it is not safe to use certain medications at the same time. Some drugs can affect your blood levels of other drugs you take, which may increase side effects or make the medications less effective.  Other drugs may affect doxycycline, including prescription and over-the-counter medicines, vitamins, and herbal products. Tell your doctor about all your current medicines and any medicine you start or stop using.  Where can I get more information?  Your pharmacist can provide more information about doxycycline.  Remember, keep this and all other medicines out of the reach of children, never share your medicines with others, and use this medication only for the indication prescribed.  Every effort has been made to ensure that the information provided by [employer] ('[employer]') is accurate, up-to-date, and complete, but no guarantee is made to that effect. Drug information contained herein may be time sensitive. [employer] information has been compiled for use by healthcare practitioners and consumers in the [place] and therefore [employer] does not warrant that uses outside of the [place] are appropriate, unless specifically indicated otherwise. [employer]'s drug information does not endorse drugs, diagnose patients or recommend therapy. [employer]'s drug information is an [employer], corporate to assist licensed healthcare practitioners in caring for their patients and/or to serve consumers viewing this service as a supplement to, and not a substitute for, the expertise, skill, knowledge and judgment of healthcare practitioners. The absence of a warning for a given drug or drug combination in no way should be construed to indicate that the drug or drug combination is safe, effective or appropriate for any given patient. [employer] does not assume any responsibility for any aspect of healthcare administered with the aid of information [employer] provides. The information contained herein is not intended to cover all possible uses, directions, precautions, warnings, drug interactions, allergic reactions, or adverse effects. If you have questions about the drugs you are taking, check with your doctor, nurse or pharmacist.  Copyright [phone] [employer] Version: 21.01. Revision date: 05/31/2017.  Care instructions adapted under license by [facility]. If you have questions about a medical condition or this instruction, always ask your healthcare professional. [employer] disclaims any warranty or liability for your use of this information.

## 2018-01-20 NOTE — Unmapped (Signed)
Dermatology Clinic Note     Assessment and Plan:    Bullous pemphigoid: Discharged from [facility] on 10/24/2017 with new diagnosis of bullous pemphigoid, supported by H&E and DIF biopsy findings. Long term treatment options are limited by patient's history of hepatitis C (currently undergoing treatment).  - Overall stable skin findings, however patient reports increased pruritus in the setting of running out of gabapentin and not being able to apply the topical triamcinolone to the back.    - Restart gabapentin (NEURONTIN) 300 MG capsule; Take 1 capsule (300 mg total) by mouth Three (3) times a day.  Dispense: 90 capsule; Refill: 3  - Will start doxycycline (MONODOX) 100 MG capsule; Take 1 capsule (100 mg total) by mouth Two (2) times a day.  Dispense: 60 capsule; Refill: 2 as a steroid sparing agent as we continue to taper the prednisone. Discussed the risks of doxycycline including but not limited to photosensitivity, GI upset and allergic reaction. Medication handout given.  - Continue prednisone taper as follows: continue prednisone 30 mg daily for 1 week, and decrease dose by 5mg  weekly   - we counseled patient to please call us if she flares or has any concern for side effects and we can counsel her on next steps. Counseled patient that tapering medication too rapidly may cause blisters to form.   - Patient has a history of hepatitis C with positive viral load on treatment currently. Will consider addition of systemic steroids sparing agent when her hepatitis C has been treated, for now will attempt to gradually taper prednisone. Followed by Dr. Sharon Mt at [facility].  - Continue hydroxyzine 25 mg q6 hrs PRN  - Continue triamcinolone ointment BID PRN for itching and rash.     RTC: Return in about 1 month (around 02/20/2018) for Recheck.      CC:   Chief Complaint   Patient presents with   ??? Follow-up     bullous pemphigoid- states she is super itchy, she states that it is getting worse on her back. Using triamcinilone     HPI:  This is a pleasant 62 y.o. female who was last seen by Dr. Louis Meckel in 12/23/2017. She has history of newly diagnosed BP as per below. At [facility], patient was continued on prednisone taper prednisone to 50mg  daily for 1 week, and decrease dose by 5mg  weekly. Since [facility], she notes that she has been a lot more itchy. No new skin rash. She has tapered down the prednisone to 30 mg daily. Ran out of the gabapentin and thinks that this is contributing to her increased pruritus. Additionally, her husband has not been home to help her apply triamcinolone ointment to the back. She thinks that the triamcinolone has been helping and leads to resolution of pruritus within about 2-3 hours after application. She is tolerating the prednisone well except for making her feel jittery. Has been taking hydroxyzine as needed for pruritus with improvement. No side effect of drowsiness from this.      Since started on treatment for Hep C with Epclusa with plan to continue treatment for 12 weeks per patient.      History- Patient was admitted to the [facility] for bullous pemphigoid and was discharged on 5/2 with a prednisone taper starting at 60 mg daily. Her lisonopril/HCTZ was also discontinued by her PCP due to concern that it caused her BP. During her admission, she was initially treated with Valtrex for presumed shingles, but a skin biopsy was  performed and instead confirmed diagnosis of bullous pemphigoid. She has a history of untreated hepatitis C.    She denies other new, changing, non-healing, tender, or bleeding lesions.  No other skin concerns.    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     Physical Examination:  General: Well-developed, well-nourished female in no acute distress, resting comfortably.  Neuro: Answers questions appropriately.  Skin: Examination of the head, neck, chest, abdomen, back bilateral upper and proximal lower extremities,was performed and notable for the below. All other areas examined were normal or had no significant findings.   - Numerous hyperpigmented macules located diffusely on the trunk and extremities. Three excoriated papules on the back. No erosions, hemorrhagic crusting, or intact bullae.  - No erosions of oral mucosa  - All other areas examined were normal or no significant findings.    The patient was discussed with Dr. Orma Flaming who agrees with the assessment and plan as above.

## 2018-01-21 NOTE — Unmapped (Signed)
Patient called and told me one day she felt like her throat was being squeezed. Then Yesterday she said she felt her face swell up. She is taking epclusa. In between those two episodes was taking doxycyline--finished that medication yesterday. She reports no other symptoms. She wants to keep taking the epclusa.     Instructed patient to go immediately to the ED if she has the episode again. Patient agrees to go get checked out if it happens again.     Patient has her first appointment while on eplcusa next week.

## 2018-01-23 ENCOUNTER — Encounter: Payer: Self-pay | Admitting: Pharmacist

## 2018-01-27 NOTE — Unmapped (Signed)
Reason for call: Reminder call for TW # 4 apt scheduled on 01/28/18 @ 1230    Hep C  Genotype: 1a (09/12/17)  Treatment: Epclusa x 12 wks  Start date: 12/24/17  Fibrosis: F1-2 (6.6 kPa) on 12/04/17  TW # 5    Reminder call placed to pt at phone number [phone] for TW # 4 apt scheduled on 01/28/18 @ 1230 with Owens Shark, DNP. Pt stated  I will be at my apt tomorrow and if my counts are good I can come off the medication. Notified pt she will need to complete the full twelve weeks of tx without missing any doses. Notified pt we are checking her HCV RNA to ensure the Dorita Fray is working. Notified pt we will not be able to check for cure until 12 wks after she has completed the full 12 weeks of tx. Advised pt to continue to take her medication at the same time every day, and avoid any alcohol. Pt verbalized understanding.     Vertell Limber RN, BSN  Nursing Care Coordinator   Pharmacy Adult GI Medicine  [facility] [place]  [address]   [place], [place] [address]  [phone]

## 2018-01-28 ENCOUNTER — Encounter: Admit: 2018-01-28 | Discharge: 2018-01-29 | Payer: MEDICAID | Attending: Family | Primary: Family

## 2018-01-28 DIAGNOSIS — B182 Chronic viral hepatitis C: Principal | ICD-10-CM

## 2018-01-28 DIAGNOSIS — M0580 Other rheumatoid arthritis with rheumatoid factor of unspecified site: Secondary | ICD-10-CM

## 2018-01-28 LAB — HEPATIC FUNCTION PANEL
ALBUMIN: 4.6 g/dL (ref 3.5–5.0)
ALKALINE PHOSPHATASE: 61 U/L (ref 38–126)
ALT (SGPT): 14 U/L (ref 13–69)
AST (SGOT): 19 U/L (ref 17–47)
BILIRUBIN DIRECT: 0.1 mg/dL (ref 0.00–0.40)
BILIRUBIN TOTAL: 0.3 mg/dL (ref 0.0–1.2)
PROTEIN TOTAL: 7.6 g/dL (ref 6.5–8.3)

## 2018-01-28 LAB — CBC W/ AUTO DIFF
BASOPHILS ABSOLUTE COUNT: 0 10*9/L (ref 0.0–0.1)
BASOPHILS RELATIVE PERCENT: 0.2 %
EOSINOPHILS ABSOLUTE COUNT: 0 10*9/L (ref 0.0–0.4)
EOSINOPHILS RELATIVE PERCENT: 0.1 %
HEMATOCRIT: 38.8 % (ref 36.0–46.0)
LARGE UNSTAINED CELLS: 1 % (ref 0–4)
LYMPHOCYTES ABSOLUTE COUNT: 1.2 10*9/L — ABNORMAL LOW (ref 1.5–5.0)
LYMPHOCYTES RELATIVE PERCENT: 8.5 %
MEAN CORPUSCULAR HEMOGLOBIN CONC: 30 g/dL — ABNORMAL LOW (ref 31.0–37.0)
MEAN CORPUSCULAR HEMOGLOBIN: 22.8 pg — ABNORMAL LOW (ref 26.0–34.0)
MEAN CORPUSCULAR VOLUME: 75.9 fL — ABNORMAL LOW (ref 80.0–100.0)
MEAN PLATELET VOLUME: 7.6 fL (ref 7.0–10.0)
MONOCYTES ABSOLUTE COUNT: 0.6 10*9/L (ref 0.2–0.8)
MONOCYTES RELATIVE PERCENT: 4.3 %
NEUTROPHILS ABSOLUTE COUNT: 11.7 10*9/L — ABNORMAL HIGH (ref 2.0–7.5)
NEUTROPHILS RELATIVE PERCENT: 86.1 %
PLATELET COUNT: 387 10*9/L (ref 150–440)
RED BLOOD CELL COUNT: 5.1 10*12/L (ref 4.00–5.20)
RED CELL DISTRIBUTION WIDTH: 15.2 % — ABNORMAL HIGH (ref 12.0–15.0)
WBC ADJUSTED: 13.6 10*9/L — ABNORMAL HIGH (ref 4.5–11.0)

## 2018-01-28 LAB — NEUTROPHILS RELATIVE PERCENT: Lab: 86.1

## 2018-01-28 LAB — BASIC METABOLIC PANEL
ANION GAP: 13 mmol/L (ref 9–15)
BLOOD UREA NITROGEN: 20 mg/dL (ref 7–21)
BUN / CREAT RATIO: 21
CALCIUM: 9.6 mg/dL (ref 8.5–10.2)
CHLORIDE: 91 mmol/L — ABNORMAL LOW (ref 98–107)
CO2: 27 mmol/L (ref 22.0–30.0)
CREATININE: 0.96 mg/dL (ref 0.60–1.00)
EGFR CKD-EPI AA FEMALE: 74 mL/min/{1.73_m2} (ref >=60)
EGFR CKD-EPI NON-AA FEMALE: 64 mL/min/{1.73_m2} (ref >=60)
GLUCOSE RANDOM: 143 mg/dL (ref 65–179)
SODIUM: 131 mmol/L — ABNORMAL LOW (ref 135–145)

## 2018-01-28 LAB — CREATININE: Creatinine:MCnc:Pt:Ser/Plas:Qn:: 0.96

## 2018-01-28 LAB — SMEAR REVIEW

## 2018-01-28 LAB — ALKALINE PHOSPHATASE: Alkaline phosphatase:CCnc:Pt:Ser/Plas:Qn:: 61

## 2018-01-28 NOTE — Unmapped (Signed)
1. Laboratory studies performed today as part of continued hepatitis C care.   2.  First hepatitis A vaccination administered today as part of series.   3. Continue Epclusa one tablet daily. Avoid missing any doses.   4.  Increase water intake and minimize caffeine consumption.   5.  Office follow up two months for end of treatment visit.   6.  Continue to monitor hoarseness involving your voice. Suggest trial of butterscotch candy to increase moisture in mouth.   7.  Follow up with dermatology as discussed.

## 2018-01-28 NOTE — Unmapped (Signed)
[facility]    Alba Destine, M.D.  Professor of Medicine  Director, [facility]  [place] [facility] [place] [facility] [place]    [phone]    Referring MD: Unknown Per Patient Pcp  No address on file    Reason For Office Visit: Treatment follow up HCV, treatment naive, genotype 1A  Epclusa x 12 weeks  Start Date: December 24, 2017  TW #5    History of Present Illness:  Marie Fletcher is a 62 y.o. female who presents for treatment follow up ~ TW #5. She has adhered to taking one tablet of Epclusa daily at 10 AM. Denies any alcohol use. Stopped taking Omeprazole 20 mg daily prior to initiation of treatment.     She presents alone today. She advises she has been experiencing numerous complaints of which she has contributed to being on [place]. Medical history significant for Bullous pemphigoid. Past week she was started on Doxycycline 100 mg po bid. Over the past week she has experienced hoarse voice which occurs shortly after taking Epclusa. She is taking Doxycycline at 7:30 AM. Prednisone has been tapered down to current dose 25 mg daily from initial starting point of 80 mg daily (back in May). See prior office note for further details surrounding dermatology history. Under the care of dermatology at [facility]    Continue to smoke 3 cigs daily.  Fair to good water intake daily. Drinking tea and green tea mostly. Drinking Coke during interviewing process. + nausea intermittently which she describes as car sickness. Only started since taking Epclusa. Takes Dramamine prn. Complaint of generalized joint pain. Advises today is not a good day for pain management. Taking Tylenol up to 2,000 mg total daily prn. + RF test result.     She remembers first learning that she had hepatitis C about 15 years ago. She has never been treated. She reports that her husband also has hepatitis C, he has not been treated for hepatitis C but she doesn't know what medication or if he achieved cure. He needs to be reevaluated again. 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 She denies any tattoos or unprofessional piercings. She reports that all of her children have been tested for hepatitis C and non reactive results.    Liver Section:  1. Chronic hepatitis C:   Baseline HCV RNA: 2,060,054 IU/mL (09/12/17).  HCV RNA TW #5- not detected     2.  + immunity hepatitis B.  3.  Immunity status hepatitis A: #1 vaccine administered 01/28/2018.   4.  HIV negative 10/17/17.   5.  Liver screening: RUQ [place] 10/22/17 which showed normal appearing liver with no liver lesions present, did show evidence of cholecystectomy and normal CBD.   6.  FibroScan: 6.6 KPS c/w stage F1 mild fibrosis    Review of Systems:  All systems reviewed and negative except in HPI.     PMH/PSH:  Past Medical History:   Diagnosis Date   ??? A-fib (CMS-HCC)    ??? Arthritis    ??? Asthma    ??? Bullous pemphigoid    ??? Coronary artery disease    ??? Disease of thyroid gland    ??? Heart attack (CMS-HCC) 1998   ??? Hepatitis C    ??? History of transfusion    ??? Hypertension    ??? Stroke (CMS-HCC)      Past Surgical History:  Procedure Laterality Date   ??? appercanes removal  1984   ??? CHOLECYSTECTOMY OPEN     ??? COLON SURGERY     ??? GASTRIC BYPASS  1995   ??? GASTROPLASTY VERTICAL BANDED  1992   ??? HERNIA REPAIR     ??? HYSTERECTOMY     ??? PR UPPER GI ENDOSCOPY,BIOPSY N/A 05/21/2016    Procedure: UGI ENDOSCOPY; WITH BIOPSY, SINGLE OR MULTIPLE;  Surgeon: Rona Ravens, MD;  Location: GI PROCEDURES [facility];  Service: Gastroenterology   ??? PR UPPER GI ENDOSCOPY,DIAGNOSIS N/A 11/27/2017    Procedure: UGI ENDO, INCLUDE ESOPHAGUS, STOMACH, & DUODENUM &/OR JEJUNUM; DX W/WO COLLECTION SPECIMN, BY BRUSH OR WASH;  Surgeon: Janyth Pupa, MD;  [facility];  Service: Gastroenterology   ??? SKIN BIOPSY       Allergies   Allergen Reactions   ??? Penicillins Anaphylaxis and Rash   ??? Sulfa (Sulfonamide Antibiotics) Anaphylaxis   ??? Sulfasalazine Anaphylaxis   ??? Lavender      Sinus swelling, nausea   ??? Codeine Nausea Only     Other reaction(s): OTHER     Current Medications:  Current Outpatient Medications   Medication Sig Dispense Refill   ??? acetaminophen (TYLENOL) 500 MG tablet Take 500 mg by mouth every six (6) hours as needed for pain.     ??? albuterol HFA 90 mcg/actuation inhaler Inhale 2 puffs every four (4) hours as needed for wheezing. 3 Inhaler 3   ??? amLODIPine (NORVASC) 5 MG tablet Take 2 tablets (10 mg total) by mouth daily. 180 tablet 3   ??? cetirizine (ZYRTEC) 10 MG tablet Take 10 mg by mouth daily.      ??? citalopram (CELEXA) 40 MG tablet Take 1 tablet (40 mg total) by mouth daily. 90 tablet 3   ??? clobetasol (TEMOVATE) 0.05 % cream Apply topically Two (2) times a day. 60 g 0   ??? diclofenac sodium (VOLTAREN) 1 % gel Apply 2 g topically Four (4) times a day.     ??? doxycycline (MONODOX) 100 MG capsule Take 1 capsule (100 mg total) by mouth Two (2) times a day. 60 capsule 2   ??? EPCLUSA tablet TAKE 1 TABLET BY MOUTH ONCE DAILY 28 each 2   ??? fluconazole (DIFLUCAN) 200 MG tablet Take 400 mg on day 1. Then take 200 mg daily for 13 days. 15 tablet 0   ??? gabapentin (NEURONTIN) 300 MG capsule Take 1 capsule (300 mg total) by mouth Three (3) times a day. 90 capsule 3   ??? hydrOXYzine (ATARAX) 25 MG tablet Take 1 tablet (25 mg total) by mouth every eight (8) hours as needed for itching. 90 tablet 3   ??? levothyroxine (SYNTHROID) 75 MCG tablet Take 1 tablet (75 mcg total) by mouth daily. 30 tablet 11   ??? omeprazole (PRILOSEC) 20 MG capsule Take 1 capsule (20 mg total) by mouth daily. 90 capsule 3   ??? predniSONE (DELTASONE) 10 MG tablet Take 50 mg daily, decrease dose by 5mg  weekly (Patient taking differently: Take 25 mg by mouth daily. Take 50 mg daily, decrease dose by 5mg  weekly) 90 tablet 0   ??? triamcinolone (KENALOG) 0.1 % ointment Apply twice a day to affected areas 454 g 6   ??? nicotine (NICODERM CQ) 21 mg/24 hr patch APPLY 1 PATCH TO SKIN ONCE DAILY FOR 6 WEEKS (REMOVE OLD PATCH BEFORE APPLYING NEW PATCH ) (Patient not taking: Reported on 01/28/2018) 42 each 0  No current facility-administered medications for this visit.      Social History Lives in [place] with her husband. Smokes 3 cigarettes per day (previously 3 ppd). Very rare alcohol use. No illicit drug use. Worked at [employer] in [employer]. 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      Social History     Tobacco Use   ??? Smoking status: Current Some Day Smoker     Packs/day: 0.25     Types: Cigarettes   ??? Smokeless tobacco: Never Used   ??? Tobacco comment: Three cigs daily    Substance Use Topics   ??? Alcohol use: No   ??? Drug use: No     Family History:  Family History   Problem Relation Age of Onset   ??? No Known Problems Mother    ??? No Known Problems Father    ??? No Known Problems Sister    ??? No Known Problems Brother    ??? No Known Problems Maternal Aunt    ??? No Known Problems Maternal Uncle    ??? No Known Problems Paternal Aunt    ??? No Known Problems Paternal Uncle    ??? No Known Problems Maternal Grandmother    ??? No Known Problems Maternal Grandfather    ??? No Known Problems Paternal Grandmother    ??? No Known Problems Paternal Grandfather    ??? Melanoma Neg Hx    ??? Basal cell carcinoma Neg Hx    ??? Squamous cell carcinoma Neg Hx    ??? Anesthesia problems Neg Hx    ??? Broken bones Neg Hx    ??? Cancer Neg Hx    ??? Clotting disorder Neg Hx    ??? Collagen disease Neg Hx    ??? Diabetes Neg Hx    ??? Dislocations Neg Hx    ??? Fibromyalgia Neg Hx    ??? Gout Neg Hx    ??? Hemophilia Neg Hx    ??? Osteoporosis Neg Hx    ??? Rheumatologic disease Neg Hx    ??? Scoliosis Neg Hx    ??? Severe sprains Neg Hx    ??? Sickle cell anemia Neg Hx    ??? Spinal Compression Fracture Neg Hx      Physical Examination:  Ht 167.6 cm (5' 5.98)  - Wt 89 kg (196 lb 1.6 oz)  - BMI 31.67 kg/m??    General appearance - alert, well appearing, and in no distress, oriented to person, place, and time and normal appearing weight. Puffy face and appearance of long term steroid use.   Mental status - normal mood, behavior, speech, dress, motor activity, and thought processes  Neurological - screening mental status exam normal    Lab:  Results for orders placed or performed in visit on 01/28/18   Basic metabolic panel   Result Value Ref Range    Sodium 131 (L) 135 - 145 mmol/L    Potassium 4.5 3.5 - 5.0 mmol/L    Chloride 91 (L) 98 - 107 mmol/L    CO2 27.0 22.0 - 30.0 mmol/L    Anion Gap 13 9 - 15 mmol/L    BUN 20 7 - 21 mg/dL    Creatinine 0.98 1.19 - 1.00 mg/dL    BUN/Creatinine Ratio 21     EGFR CKD-EPI Non-African American, Female 64 >=60 mL/min/1.48m2    EGFR CKD-EPI African American, Female 69 >=60 mL/min/1.85m2    Glucose 143 65 - 179 mg/dL    Calcium 9.6 8.5 - 10.2  mg/dL   Hepatic Function Panel   Result Value Ref Range    Albumin 4.6 3.5 - 5.0 g/dL    Total Protein 7.6 6.5 - 8.3 g/dL    Total Bilirubin 0.3 0.0 - 1.2 mg/dL    Bilirubin, Direct 4.40 0.00 - 0.40 mg/dL    AST 19 17 - 47 U/L    ALT 14 13 - 69 U/L    Alkaline Phosphatase 61 38 - 126 U/L   Hepatitis C RNA, Quantitative, PCR   Result Value Ref Range    HCV RNA Not Detected     HCV RNA (IU)  <=0 IU/mL    HCV RNA Log(10)  <0.00 log IU/mL    HCV RNA Comment       Hepatitis C virus RNA quantification is performed using the FDA-approved Abbott RealTime PCR HCV test, targeting the 5' UTR. This test can quantify HCV RNA over the range of 12-100,000,000 IU/mL (1.08 log(10) - 8.0 log(10) IU/mL). Both the limit of detection and limit of quantification are 12 IU/mL. The reference range for this assay is Not Detected.   CBC w/ Differential   Result Value Ref Range    WBC 13.6 (H) 4.5 - 11.0 10*9/L    RBC 5.10 4.00 - 5.20 10*12/L    HGB 11.6 (L) 12.0 - 16.0 g/dL    HCT 10.2 72.5 - 36.6 %    MCV 75.9 (L) 80.0 - 100.0 fL    MCH 22.8 (L) 26.0 - 34.0 pg    MCHC 30.0 (L) 31.0 - 37.0 g/dL    RDW 44.0 (H) 34.7 - 15.0 %    MPV 7.6 7.0 - 10.0 fL    Platelet 387 150 - 440 10*9/L Neutrophils % 86.1 %    Lymphocytes % 8.5 %    Monocytes % 4.3 %    Eosinophils % 0.1 %    Basophils % 0.2 %    Absolute Neutrophils 11.7 (H) 2.0 - 7.5 10*9/L    Absolute Lymphocytes 1.2 (L) 1.5 - 5.0 10*9/L    Absolute Monocytes 0.6 0.2 - 0.8 10*9/L    Absolute Eosinophils 0.0 0.0 - 0.4 10*9/L    Absolute Basophils 0.0 0.0 - 0.1 10*9/L    Large Unstained Cells 1 0 - 4 %    Microcytosis Moderate (A) Not Present    Hypochromasia Marked (A) Not Present   Morphology Review   Result Value Ref Range    Smear Review Comments See Comment (A) Undefined     Assessment/Plan:  1. Chronic hepatitis C:    Marie Fletcher is a 62 y.o. with treatment naive chronic hepatitis C, genotype 1a who presents today for treatment follow up ~ TW #5. She has adhered to taking Epclusa one tablet daily. Denies having missed any doses. No alcohol use or anti acid use during treatment. Several complaints today as noted above of which we do not believe are related to DAA treatment. Question contributed by Doxycyline since onset of majority of symptoms started within the past week. Additionally, possible being off PPI therapy. Reassured patient of no DDI with Epclusa and Doxycycline. She is going to speak with dermatology about possible holding antibiotic until DAA treatment has been completed. Minimal evidence of fibrosis per [facility].     ~ HCV safety labs ordered.   ~ Stressed importance of increasing water intake.   ~ Continue Epclusa one tablet daily. Avoid missing any doses.   ~ No alcohol use or anti acid medications during treatment.  2.  Bullous Pemphigoid: She does not have any evidence of cirrhosis or advanced fibrosis so there are no contraindications to the use of methotrexate or other steroid sparing agents mentioned by Dermatology given her history of hepatitis C. + immunity hepatitis B. HBS-AG IS NEGATIVE BUT HAS PRIOR EXPOSURE TO HBV (HBCOREAB+). WOULD NEED HBV PROPHYLAXIS IF AGENTS SUCH AS RITUXIMAB CONSIDERED IN THE FUTURE.     3. RF is positive: Cryoglobulin level ordered.     4. Hepatitis A status: #1 hepatitis A vaccine administered today.     5. Alcohol assessment: Rare alcohol use: Counseled on need for complete alcohol cessation given hepatitis C infection as well as possible initiation of MTX/other immunosuppressants.     6. Hoarse voice: Increase water intake. Minimum caffeine drinks. Hard candy prn.     All patient's questions were answered to her satisfaction during office visit today.     Rodman Key, DNP, FNP-BC  [facility]  [address]  [place] [address]  Phone [phone]

## 2018-01-29 MED ORDER — ALBUTEROL SULFATE HFA 90 MCG/ACTUATION AEROSOL INHALER
RESPIRATORY_TRACT | 0 refills | 0.00000 days | Status: CP | PRN
Start: 2018-01-29 — End: 2018-04-09

## 2018-01-30 LAB — HCV RNA(IU): Hepatitis C virus RNA:ACnc:Pt:Ser/Plas:Qn:Probe.amp.tar: 0

## 2018-01-30 LAB — HEPATITIS C RNA, QUANTITATIVE, PCR

## 2018-01-31 LAB — CRYOGLOBULIN: Cryoglobulin/Serum.total:VFr:Pt:Ser:Qn:Spun Westergren tube: NEGATIVE

## 2018-02-05 MED ORDER — NYSTATIN 100,000 UNIT/ML ORAL SUSPENSION
ORAL | 1 refills | 0.00000 days | Status: CP
Start: 2018-02-05 — End: 2018-05-29

## 2018-02-05 NOTE — Unmapped (Signed)
Dr.Corcimaru,    [facility] called on behalf of the patient since the patient has left several messages on the nurse line but has not rec'd a call back yet. Patient has thrush built up in her mouth and would like a rx for thrush as it is not within liver clinic protocol to prescribe it to a patient. Patient did had lidocaine but said it is not helping. Per liver clinic all pt demographics and pharmacy is correct.

## 2018-02-05 NOTE — Unmapped (Signed)
Patient is calling to say that she has thrush on her tongue and her throat hurts. She was given diflucan in the past and would like a script for that again. She is unable to come in to be seen. She did also put a message out to her Dermatologist but she hasn't heard anything.

## 2018-02-06 NOTE — Unmapped (Signed)
Pt has been added and contacted

## 2018-02-06 NOTE — Unmapped (Signed)
Received message from pt regarding oral thrush. She has had thrush before and thinks this has recurred. Currently on doxycycline and prednisone. Sending in nystatin swish and spit and will schedule for close follow-up with Dr. Orma Flaming on Friday (8/16). Holding off on diflucan at this time given hx HCV.

## 2018-02-07 ENCOUNTER — Ambulatory Visit: Admit: 2018-02-07 | Discharge: 2018-02-08

## 2018-02-07 DIAGNOSIS — B370 Candidal stomatitis: Principal | ICD-10-CM

## 2018-02-07 DIAGNOSIS — L120 Bullous pemphigoid: Secondary | ICD-10-CM

## 2018-02-07 MED ORDER — FLUCONAZOLE 200 MG TABLET
ORAL_TABLET | 0 refills | 0.00000 days | Status: CP
Start: 2018-02-07 — End: 2018-05-12

## 2018-02-07 MED ORDER — FLUCONAZOLE 200 MG TABLET: tablet | 0 refills | 0 days | Status: AC

## 2018-02-07 NOTE — Unmapped (Signed)
Patient came in for Dermatology appointment. She needed to cancel her 02-10-18 Mammo appointment due to lack of transportation. It is canceled today 02-07-18

## 2018-02-09 NOTE — Unmapped (Signed)
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     Oral candidiasis, confirmed by KOH:  - Likely as a side effect of doxycycline started at last visit 2 weeks ago; doxy stopped  - Slight improvement with nystatin swish x 1 day  - Start fluconazole 400mg  on day 1, then 200mg  daily x 2 weeks  - After 2 weeks, resume nystatin swish as prophylaxis  - Soak dental appliances (upper dentures) in nystatin swish overnight to avoid reinfection  - Fluconazole has drug interactions with citalopram and hydroxyzine and patient will hold these medications while taking fluconazole (she has safely done this before)    Hepatitis C:  - Undergoing active treatment with plans for 12 weeks  - Then we will work the GI to start steroid sparing agent    RTC: Appt 02/21/18 with Dr. Lorelee Cover      CC:   Chief Complaint   Patient presents with   ??? Skin Problem     Oral Marie Fletcher, just got medcine on Thursday, it is better at this time, but still aggravatng     HPI:  This is a pleasant 62 y.o. female with bullous pemphigoid and Hep C who was last seen 01/20/2018. At the last visit we restarted gabapentin 300mg  TID, started doxycycline 100mg  bid as a steroid sparing agent, continued prednisone taper 30 mg daily for 1 week, and decrease dose by 5mg  weekly, continued hydroxyzine 25mg  q6hr prn itch and triamcinolone 0.1% ointment bid prn rash, blisters.    Interval history:    Since her last visit she has continued on all medications as per above.  About 1 week ago she noted a thick white coating to her tongue and palate.  She feels this is consistent with her previous episodes of thrush.  She called in and we started nystatin swish and spit.  She has used this for one day and has noted some improvement, but it does make her gag.  She attributes the thrush to starting doxycycline.  She is down to prednisone 20mg  a day, but has noted some new small blisters on her left neck and chin and arm.  They are itchy.      Disease history:  - BP diagnosed 10/24/17 during hospitalization at [facility] confirmed by H&E and DIF.  She was started on prednisone 60mg  daily and decreased by 5mg  weekly.  Triamcinolone 0.1% for active lesions.  Using gabapentin and hydroxyzine for itch. Of note, lisonopril/HCTZ was also discontinued by her PCP due to concern that it caused her BP.  - Treatment with steroid sparing agents limited by Hep C   - Patient has a history of hepatitis C with positive viral load on treatment currently. Will consider addition of systemic steroids sparing agent when her hepatitis C has been treated, for now will attempt to gradually taper prednisone. Followed by Dr. Sharon Mt at [facility]. Treatment for Hep C with Epclusa with plan to continue treatment for  12 weeks per patient.     She denies other new, changing, non-healing, tender, or bleeding lesions.  No other skin concerns.    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     Physical Examination:  General: Well-developed, well-nourished female in no acute distress, resting comfortably.  Neuro: Answers questions appropriately.  Skin: Examination of the head, neck, chest, bilateral upper extremities, oral mucosa to include lips, gingiva, tongue, bilateral buccal mucosa, hard and soft palate, posterior pharynx was performed and notable for the below. All other areas examined were normal or had no significant findings.   - Oral mucosa with white adherent papules on hard and soft palate and tongue  - Upper dental appliance in place, removed and deep erythema noted  - Left neck, chin and arm with small erythematous papules with central erosion  - Numerous hyperpigmented macules located diffusely on the arms  - All other areas examined were normal or no significant findings.    A scraping of representative lesions of the hard palate was performed and examined microscopically using a chlorazol black preparation: positive for yeast and pseudohyphae      The patient was discussed with Dr. Orma Flaming who agrees with the assessment and plan as above.

## 2018-02-19 ENCOUNTER — Encounter
Admit: 2018-02-19 | Discharge: 2018-02-19 | Payer: MEDICAID | Attending: Student in an Organized Health Care Education/Training Program | Primary: Student in an Organized Health Care Education/Training Program

## 2018-02-19 ENCOUNTER — Ambulatory Visit: Admit: 2018-02-19 | Discharge: 2018-02-19 | Payer: MEDICAID

## 2018-02-19 DIAGNOSIS — Z0000 Encounter for general adult medical examination without abnormal findings: Secondary | ICD-10-CM

## 2018-02-19 DIAGNOSIS — B182 Chronic viral hepatitis C: Secondary | ICD-10-CM

## 2018-02-19 DIAGNOSIS — F419 Anxiety disorder, unspecified: Principal | ICD-10-CM

## 2018-02-19 DIAGNOSIS — L120 Bullous pemphigoid: Secondary | ICD-10-CM

## 2018-02-19 DIAGNOSIS — K5909 Other constipation: Secondary | ICD-10-CM

## 2018-02-19 DIAGNOSIS — I10 Essential (primary) hypertension: Secondary | ICD-10-CM

## 2018-02-19 MED ORDER — DICLOFENAC 1 % TOPICAL GEL
Freq: Four times a day (QID) | TOPICAL | 0 refills | 0 days | Status: CP
Start: 2018-02-19 — End: 2018-08-25

## 2018-02-19 MED ORDER — GABAPENTIN 400 MG CAPSULE: 400 mg | capsule | Freq: Three times a day (TID) | 11 refills | 0 days | Status: AC

## 2018-02-19 MED ORDER — GABAPENTIN 400 MG CAPSULE
ORAL_CAPSULE | Freq: Three times a day (TID) | ORAL | 11 refills | 0.00000 days | Status: CP
Start: 2018-02-19 — End: 2018-02-27

## 2018-02-19 NOTE — Unmapped (Signed)
Internal Medicine Clinic Visit    Reason for Visit:  Follow up for anxiety     A/P:    1. Anxiety    2. Bullous pemphigoid    3. Chronic constipation    4. Chronic hepatitis C without hepatic coma (CMS-HCC)    5. Essential hypertension    6. Health care maintenance      Hypertension:  Initial blood pressure was above 139 systolic or above 89 diastolic. It was rechecked and entered in Rooming Tab-->Vitals-->New Set of Vitals    1. Anxiety- Celexa stopped since on Diflucan. QTC is clinic 460. Would suggest against stopping Celexa abruptly in future. Could consider decreasing dose and monitoring QTC if Diflucan therapy is again needed   - ECG 12 lead  -Restart Celexa 40mg  daily     2. Bullous pemphigoid- Advised pt to discuss mng with dermatology.   -Encouraged pt to start vitamin D and calcium supplementation while on steroids     3. Chronic constipation- No signs of SBO. TSH recently checked and WNL (08/2017). If constipation worsens after restarting Celexa, may merit further evaluation.  -Start miralax daily and senna PRN    4. Chronic hepatitis C without hepatic coma (CMS-HCC)  CTN Epclusa per hepatology     5. Essential hypertension- BP better on repeat   -CTN Norvasc 5mg , pt advised to take BP at home and call if elevated     6. Chronic pain  - Inxrease gabapentin (NEURONTIN) 400 MG capsule; Take 1 capsule (400 mg total) by mouth Three (3) times a day.  Dispense: 90 capsule; Refill: 11  -Refill of voltaren gel     7. Health care maintenance  - Immunochemical Fecal Occult Blood Test (FIT), automated; Future    Return in about 3 months (around 05/22/2018).    Patient was discussed with Dr. Chestine Spore   __________________________________________________________    HPI: 62yF with liver disease due to Hep C, Bullous pemphigoid, multiple abdominal operations, HTN, Chronic constipation returns to clinic for follow-up.    The patient is currently undergoing a steroid taper for bulluous pemphigoid. The patient has been off Celexa due to starting Diflucan for thrush and has felt off with diffuse pains, anxiety, nightmares since stopping.    The patient has been constipated. Has history of chronic constipation but is usually regular with colace. She had BM yesterday after taking Castor oil. She is having a little nausea but no vomiting. Is passing gas. Hx of multiple GI surgeries .     The patient has b/l knee pain which is chronic but worse since stopping Celexa.     She is trying to quit smoking. She has been able to cut down.      BP running 140/80s at home. There have been a few high readings at home but has been checking when she felt had.   __________________________________________________________    Problem List:  Patient Active Problem List   Diagnosis   ??? Anemia   ??? Hypertension   ??? Hypothyroidism   ??? Chronic hepatitis C without hepatic coma (CMS-HCC)   ??? Anxiety   ??? History of GI bleed   ??? Chronic constipation   ??? Depression   ??? Smoking history   ??? Itching   ??? Asthma exacerbation   ??? Actinic keratitis   ??? Chronic pain   ??? Bullous pemphigoid       Medications:  Reviewed in EPIC  __________________________________________________________    Physical Exam:   Vital Signs:  Vitals:  02/19/18 1604 02/19/18 1644   BP: 160/93 145/78   Pulse: 85    SpO2: 96%    Weight: 90.7 kg (199 lb 15.3 oz)    Height: 167.6 cm (5' 6)       Body mass index is 32.27 kg/m??.    Gen: Chronically Ill appearing, no acute distress  HEENT: Faint white lesions on tongue, poor dentition with dentures in place  CV: RRR, no murmurs  Pulm: CTA bilaterally, no crackles or wheezes  GI: Soft, NTND, normal BS. No HSM.  Ext: No edema, warm extremities  Skin: No bullous lesions, multiple hyperpigmented lesion on arms

## 2018-02-19 NOTE — Unmapped (Addendum)
Try Miralax or sennakot for constipation.   Checking an EKG. Restart Celexa.   Try to walk or swim a few times a week   Check blood pressure at home. Call clinic if over 160/90 on several checks at home.   Increasing gabapentin  Checking FIT   Come back to clinic if you have these symptoms after restarting Celexa

## 2018-02-20 NOTE — Unmapped (Signed)
I reviewed with the resident the medical history and the resident???s findings on physical examination.  I discussed with the resident the patient???s diagnosis and concur with the treatment plan as documented in the resident note. April Holding, MD

## 2018-02-22 ENCOUNTER — Encounter: Admit: 2018-02-22 | Discharge: 2018-02-23 | Payer: MEDICAID

## 2018-02-22 DIAGNOSIS — Z0000 Encounter for general adult medical examination without abnormal findings: Principal | ICD-10-CM

## 2018-02-27 ENCOUNTER — Ambulatory Visit: Admit: 2018-02-27 | Discharge: 2018-02-27 | Payer: MEDICAID

## 2018-02-27 ENCOUNTER — Encounter: Admit: 2018-02-27 | Discharge: 2018-02-27 | Payer: MEDICAID

## 2018-02-27 DIAGNOSIS — L120 Bullous pemphigoid: Secondary | ICD-10-CM

## 2018-02-27 DIAGNOSIS — M792 Neuralgia and neuritis, unspecified: Principal | ICD-10-CM

## 2018-02-27 LAB — BASIC METABOLIC PANEL
ANION GAP: 13 mmol/L (ref 9–15)
BLOOD UREA NITROGEN: 15 mg/dL (ref 7–21)
BUN / CREAT RATIO: 14
CALCIUM: 9.9 mg/dL (ref 8.5–10.2)
CHLORIDE: 90 mmol/L — ABNORMAL LOW (ref 98–107)
CO2: 28 mmol/L (ref 22.0–30.0)
CREATININE: 1.09 mg/dL — ABNORMAL HIGH (ref 0.60–1.00)
EGFR CKD-EPI AA FEMALE: 63 mL/min/{1.73_m2} (ref >=60)
GLUCOSE RANDOM: 129 mg/dL (ref 65–179)
POTASSIUM: 4.7 mmol/L (ref 3.5–5.0)
SODIUM: 131 mmol/L — ABNORMAL LOW (ref 135–145)

## 2018-02-27 LAB — THYROID STIMULATING HORMONE: Thyrotropin:ACnc:Pt:Ser/Plas:Qn:: 1.157

## 2018-02-27 LAB — VITAMIN B-12: Cobalamins:MCnc:Pt:Ser/Plas:Qn:: 445

## 2018-02-27 LAB — CO2: Carbon dioxide:SCnc:Pt:Ser/Plas:Qn:: 28

## 2018-02-27 MED ORDER — GABAPENTIN 400 MG CAPSULE
ORAL_CAPSULE | Freq: Three times a day (TID) | ORAL | 11 refills | 0 days | Status: CP
Start: 2018-02-27 — End: ?

## 2018-02-27 NOTE — Unmapped (Addendum)
You were seen in the clinic today for neuropathy and swelling.  As we discussed, we will draw some blood and check some labs to try and identify the cause of your neuropathy.  The labs we are checking today include: electrolyte levels, thyroid hormone levels, blood glucose, RPR, and vitamin B12 levels.  We will call to let you know if any of these are abnormal.      In the mean time, please increase your dose of gabapentin to 800 mg 3 times each day slowly.  Below are instructions for increasing your dose over the course of 6 days:  - Take 400 mg (1 pill) in the morning, 400 mg (1 pill) in the afternoon, and 800 mg (2 pills) at night for 3 days (9/5-9/7)  - Take 400 mg (1 pill) in the morning, 800 mg (2 pills) in the afternoon, and 800 mg (2 pills) at night for 3 days (9/8-9/10)  - Finally, take 800 mg (2 pills) in the morning, 800 mg (8 pills) in the afternoon, and 800 mg (2 pills) at night   - Your new dose is 800 mg 3 times each day    Also, remember that wearing compression socks / stockings can help with the swelling in your legs, as can leg elevation.      Below is some information about the flu vaccine:  Influenza (Flu) Vaccine (Inactivated or Recombinant): What You Need to Know  Why get vaccinated?  Influenza (flu) is a contagious disease that spreads around the [place] every winter, usually between October and May.  Flu is caused by influenza viruses and is spread mainly by coughing, sneezing, and close contact.  Anyone can get flu. Flu strikes suddenly and can last several days. Symptoms vary by age, but can include:  ?? Fever/chills.  ?? Sore throat.  ?? Muscle aches.  ?? Fatigue.  ?? Cough.  ?? Headache.  ?? Runny or stuffy nose.  Flu can also lead to pneumonia and blood infections, and cause diarrhea and seizures in children. If you have a medical condition, such as heart or lung disease, flu can make it worse.  Flu is more dangerous for some people. Infants and young children, people 56 years of age and older, pregnant women, and people with certain health conditions or a weakened immune system are at greatest risk.  Each year thousands of people in the [place] die from flu, and many more are hospitalized.  Flu vaccine can:  ?? Keep you from getting flu.  ?? Make flu less severe if you do get it.  ?? Keep you from spreading flu to your family and other people.  Inactivated and recombinant flu vaccines  A dose of flu vaccine is recommended every flu season. Children 6 months through 65 years of age may need two doses during the same flu season. Everyone else needs only one dose each flu season.  Some inactivated flu vaccines contain a very small amount of a mercury-based preservative called thimerosal. Studies have not shown thimerosal in vaccines to be harmful, but flu vaccines that do not contain thimerosal are available.  There is no live flu virus in flu shots. They cannot cause the flu.  There are many flu viruses, and they are always changing. Each year a new flu vaccine is made to protect against three or four viruses that are likely to cause disease in the upcoming flu season. But even when the vaccine doesn't exactly match these viruses, it may still  provide some protection.  Flu vaccine cannot prevent:  ?? Flu that is caused by a virus not covered by the vaccine.  ?? Illnesses that look like flu but are not.  Some people should not get this vaccine  Tell the person who is giving you the vaccine:  ?? If you have any severe (life-threatening) allergies. If you ever had a life-threatening allergic reaction after a dose of flu vaccine, or have a severe allergy to any part of this vaccine, you may be advised not to get vaccinated. Most, but not all, types of flu vaccine contain a small amount of egg protein.  ?? If you ever had Guillain-Barr?? syndrome (also called GBS) Some people with a history of GBS should not get this vaccine. This should be discussed with your doctor.  ?? If you are not feeling well. It is usually okay to get flu vaccine when you have a mild illness, but you might be asked to come back when you feel better.  Risks of a vaccine reaction  With any medicine, including vaccines, there is a chance of reactions. These are usually mild and go away on their own, but serious reactions are also possible.  Most people who get a flu shot do not have any problems with it.  Minor problems following a flu shot include:  ?? Soreness, redness, or swelling where the shot was given  ?? Hoarseness  ?? Sore, red or itchy eyes  ?? Cough  ?? Fever  ?? Aches  ?? Headache  ?? Itching  ?? Fatigue  If these problems occur, they usually begin soon after the shot and last 1 or 2 days.  More serious problems following a flu shot can include the following:  ?? There may be a small increased risk of Guillain-Barr?? Syndrome (GBS) after inactivated flu vaccine. This risk has been estimated at 1 or 2 additional cases per million people vaccinated. This is much lower than the risk of severe complications from flu, which can be prevented by flu vaccine.  ?? Young children who get the flu shot along with pneumococcal vaccine (PCV13) and/or DTaP vaccine at the same time might be slightly more likely to have a seizure caused by fever. Ask your doctor for more information. Tell your doctor if a child who is getting flu vaccine has ever had a seizure  Problems that could happen after any injected vaccine:  ?? People sometimes faint after a medical procedure, including vaccination. Sitting or lying down for about 15 minutes can help prevent fainting, and injuries caused by a fall. Tell your doctor if you feel dizzy, or have vision changes or ringing in the ears.  ?? Some people get severe pain in the shoulder and have difficulty moving the arm where a shot was given. This happens very rarely.  ?? Any medication can cause a severe allergic reaction. Such reactions from a vaccine are very rare, estimated at about 1 in a million doses, and would happen within a few minutes to a few hours after the vaccination.  As with any medicine, there is a very remote chance of a vaccine causing a serious injury or death.  The safety of vaccines is always being monitored. For more information, visit: http://floyd.org/.  What if there is a serious reaction?  What should I look for?  ?? Look for anything that concerns you, such as signs of a severe allergic reaction, very high fever, or unusual behavior.  Signs of a severe allergic reaction can  include hives, swelling of the face and throat, difficulty breathing, a fast heartbeat, dizziness, and weakness ??? usually within a few minutes to a few hours after the vaccination.  What should I do?  ?? If you think it is a severe allergic reaction or other emergency that can't wait, call 9-1-1 and get the person to the nearest hospital. Otherwise, call your doctor.  ?? Reactions should be reported to the Vaccine Adverse Event Reporting System (VAERS). Your doctor should file this report, or you can do it yourself through the VAERS website at www.vaers.LAgents.no, or by calling 1-512-454-2856.  VAERS does not give medical advice.  The National Vaccine Injury Compensation Program  The National Vaccine Injury Compensation Program (VICP) is a federal program that was created to compensate people who may have been injured by certain vaccines.  Persons who believe they may have been injured by a vaccine can learn about the program and about filing a claim by calling 1-437 327 2791 or visiting the VICP website at SpiritualWord.at. There is a time limit to file a claim for compensation.  How can I learn more?  ?? Ask your healthcare provider. He or she can give you the vaccine package insert or suggest other sources of information.  ?? Call your local or state health department.  ?? Contact the Centers for Disease Control and Prevention (CDC):  ? Call 986-016-3320 (1-800-CDC-INFO) or  ? Visit CDC's website at BiotechRoom.com.cy Vaccine Information Statement  Inactivated Influenza Vaccine  01/29/2014)  42 U.S.C. ?? [phone]  Department of Health and Insurance risk surveyor for Disease Control and Prevention  Many Vaccine Information Statements are available in Spanish and other languages. See PromoAge.com.br.  Muchas hojas de informaci??n sobre vacunas est??n disponibles en espa??ol y en otros idiomas. Visite PromoAge.com.br.  Care instructions adapted under license by [facility]. If you have questions about a medical condition or this instruction, always ask your healthcare professional. [employer] disclaims any warranty or liability for your use of this information.

## 2018-02-27 NOTE — Unmapped (Signed)
INTERNAL MEDICINE ADVANCED SAME DAY CLINIC NOTE     02/27/2018    PCP: Rosette Reveal, MD     Assessment and Plan    Marie Fletcher was seen today for leg pain.    Diagnoses and all orders for this visit:    Neuropathic pain - Foot and knee swelling  Patient reports increased pain in bilateral legs, described as burning / tingling. Currently taking 400 mg gabapentin TID, increased from 300 mg TID earlier this month due to poor pain control. Also notes occasional swelling in feet and knees R>L. Etiology of neuropathy is unclear. Intermittent swelling is most likely due to venous stasis.  - Increase gabapentin to 800 mg TID (400+400+800 x 3d, then 536+644+034 x 3d)  - Work up for neuropathic pain:  -     Basic metabolic panel  -     TSH  -     Vitamin B12  -     RPR   -     Hemoglobin A1c  - Compression stockings and leg elevation as needed for lower extremity swelling    Bullous pemphigoid  -    Increase gabapentin as described above    Other orders  -     INFLUENZA VACCINE (QUAD) IM - 6 MO-ADULT - PF    Follow up as scheduled or sooner as needed.    Patient was seen and discussed with Dr. Altamease Oiler who is in agreement with the assessment and plan as outlined above.     Marie Rigger, MD PGY1    Subjective    Problem List:  Patient Active Problem List   Diagnosis   ??? Anemia   ??? Hypertension   ??? Hypothyroidism   ??? Chronic hepatitis C without hepatic coma (CMS-HCC)   ??? Anxiety   ??? History of GI bleed   ??? Chronic constipation   ??? Depression   ??? Smoking history   ??? Itching   ??? Asthma exacerbation   ??? Actinic keratitis   ??? Chronic pain   ??? Bullous pemphigoid       HPI  Marie Fletcher is a 62 y.o. year old female with the above problem list who presents to the same day clinic for a tingling/ burning pain in her feet bilaterally.  She reports that the pain has been present for a few months but significantly worse for the past week.  She describes it as very intense, like bugs are crawling in and on my feet and like pins and needles. Says it is about 15/10, limiting her ability to walk and do other activities.  The pain is located on both the soles and tops of her feet and stops at her ankles. For the past 3 days she has also noted some swelling in her feet and right knee, but on exam today her feet are not swollen.  Her right knee is larger than the left but she says that this has been a chronic issue.  She has not ever noticed pitting lower extremity edema that extends past her feet.    She denies a history of diabetes or heart failure.  Does have hepatitis C, but is nearing the end of her treatment course with epclusa.  Denies chest pain, palpitations, shortness of breath, and orthopnea.    Chief Complaint   Patient presents with   ??? Leg Pain     ~1 month, progressive worse bilaterally, from knee to feet, edematous, worse at night, burning/stinging/pins and needles, 10/10 intermittent  Meds and allergies were reviewed in Epic    ROS: 10 point ROS was performed and is otherwise negative other than mentioned in the HPI    Objective  PE:  Vitals:    02/27/18 1355   BP: 156/102   Pulse: 99   Resp: 16   Temp: 36.8 ??C   SpO2: 96%     General: well-appearing in mild distress. Looks uncomfortable  Eyes: EOMI, sclera clear, PERRL  ENT: OP clear w/o erythema or exudate. Poor dentition  CV: regular, no murmurs  Resp: CTAB, no wheezes or crackles, normal WOB  GI: soft, NTND, NABS. No ascites  MSK: R knee is swollen larger than L. Nontender to palpation. Prominent veins noted on back of R knee  Skin: clean and dry. Scarring from bullous pemphigoid noted on arms and legs  Ext: no lower extremity edema  Neuro: alert, follows commands. CN II-XII grossly intact    Procedure: None  See procedure note from this encounter    Metric Tracker:  Did today's visit result in referral to ED or direct admission? No

## 2018-02-27 NOTE — Unmapped (Signed)
I saw and evaluated the patient, participating in the key portions of the service.  I reviewed the resident’s note.  I agree with the resident’s findings and plan. Dujuan Stankowski E Kimel-Scott, MD

## 2018-02-28 LAB — SYPHILIS RPR SCREEN: Reagin Ab:PrThr:Pt:Ser:Ord:RPR: NONREACTIVE

## 2018-02-28 LAB — ESTIMATED AVERAGE GLUCOSE: Estimated average glucose:MCnc:Pt:Bld:Qn:Estimated from glycated hemoglobin: 146

## 2018-02-28 NOTE — Unmapped (Signed)
Spoke with patient and reviewed laboratory results from her recent hepatology appointment. Instructed patient to remain scheduled for her upcoming appointment and continue to adhere to taking Epclusa as prescribed. Patient voiced understanding.

## 2018-03-06 NOTE — Unmapped (Signed)
Follow-Up Counseling for HCV Treatment      Regimen: Epclusa (sofosbuvir/velpatasvir 400/100mg ) x 12 weeks  Start Date: 01/13/18  Completed Treatment Week #10    Pharmacy: [facility] [phone]  [phone]    Following topics were reviewed during the phone call:  Patient Counseling    Counseled the patient on the following:  doses and administration discussed, possible adverse effects and management discussed, possible drug and prescription drug interactions discussed, possible drug and OTC drug and food interactions discussed, lab monitoring and follow-up discussed, adherence and missed doses discussed, pharmacy contact information discussed         1. Medication administration - Takes Epclusa daily around 10am.    2. Importance of adherence -   Medication Adherence    Adherence tools used:  alarm     Pill count over the phone revealed #11 tablets which is appropriate.    3. Side effects - No new side effects to report.    4. Drug-drug interaction -  Denies alcohol. Pt has discontinued omeprazole.  Drug Interactions    Drug interactions evaluated:  yes  Clinically relevant drug interactions identified:  yes   Interactions list:  Omeprazole 20mg : may decrease efficacy of Epclusa   Drug management plan:  Dorita Fray should be taken with food 4 hours prior to PPI (max dose omeprazole 20mg  equivalent). Pt plans to take Epclusa around 10am with food then omeprazole at 2pm.          5. Follow up - Has follow up appointment scheduled in HCV treatment clinic on 04/01/18.   Informed patient that there is still a small chance of relapse after finishing the treatment. Stressed importance of follow up 3 months post treatment to assess for cure.      All questions were answered.    Park Breed, Pharm D., BCPS, BCGP, CPP  [facility]  [address]  [place], [place] [address]  [phone]    March 06, 2018 2:42 PM

## 2018-03-06 NOTE — Unmapped (Signed)
Advised pt that FIT test was positive. Referral for colonoscopy sent. Emphasized the importance of the colonoscopy especially with constipation and smoking history.

## 2018-03-10 MED ORDER — PREDNISONE 10 MG TABLET
ORAL_TABLET | Freq: Every day | ORAL | 0 refills | 0 days | Status: CP
Start: 2018-03-10 — End: 2018-04-03

## 2018-03-10 NOTE — Unmapped (Signed)
-----   Message from [facility] [place] sent at 03/05/2018 10:34 AM EDT -----  Regarding: RE: [facility] patient question  Dr. Orma Flaming,    It would be great to wait until her HCV treatment has been completed if possible.     Dawn     ----- Message -----  From: Benjie Karvonen, MD  Sent: 03/03/2018   5:10 PM EDT  To: Fawn Kirk, FNP  Subject: [facility] patient question                          Hi - I am seeing Ms. Wingert for bullous pemphigoid.  We are treating her with prednisone and were holding off starting a steroid sparing agent until her Hep C had been treated.  I noted in your last note that you were okay with methotrexate.  Would you prefer we wait until she completes treatment to start it?  I think she finishes her treatment with you all in early October, so no worries if you prefer we wait.    Thanks,  Lupita Leash    Sincerely,  Geroge Baseman Orma Flaming, MD, PhD  Associate Professor of Dermatology  [place] [facility] [place] [facility] [place]  [phone] (personal office)

## 2018-04-01 ENCOUNTER — Encounter: Admit: 2018-04-01 | Discharge: 2018-04-01 | Payer: MEDICAID | Attending: Family | Primary: Family

## 2018-04-01 DIAGNOSIS — B182 Chronic viral hepatitis C: Principal | ICD-10-CM

## 2018-04-01 LAB — CBC W/ AUTO DIFF
BASOPHILS ABSOLUTE COUNT: 0 10*9/L (ref 0.0–0.1)
BASOPHILS RELATIVE PERCENT: 0.3 %
EOSINOPHILS ABSOLUTE COUNT: 0 10*9/L (ref 0.0–0.4)
EOSINOPHILS RELATIVE PERCENT: 0.3 %
HEMATOCRIT: 34.8 % — ABNORMAL LOW (ref 36.0–46.0)
HEMOGLOBIN: 9.9 g/dL — ABNORMAL LOW (ref 12.0–16.0)
LARGE UNSTAINED CELLS: 1 % (ref 0–4)
LYMPHOCYTES ABSOLUTE COUNT: 0.7 10*9/L — ABNORMAL LOW (ref 1.5–5.0)
LYMPHOCYTES RELATIVE PERCENT: 7.1 %
MEAN CORPUSCULAR HEMOGLOBIN CONC: 28.6 g/dL — ABNORMAL LOW (ref 31.0–37.0)
MEAN CORPUSCULAR HEMOGLOBIN: 20.7 pg — ABNORMAL LOW (ref 26.0–34.0)
MEAN CORPUSCULAR VOLUME: 72.5 fL — ABNORMAL LOW (ref 80.0–100.0)
MEAN PLATELET VOLUME: 8.3 fL (ref 7.0–10.0)
MONOCYTES ABSOLUTE COUNT: 0.3 10*9/L (ref 0.2–0.8)
MONOCYTES RELATIVE PERCENT: 3.1 %
NEUTROPHILS ABSOLUTE COUNT: 9.2 10*9/L — ABNORMAL HIGH (ref 2.0–7.5)
RED BLOOD CELL COUNT: 4.79 10*12/L (ref 4.00–5.20)
RED CELL DISTRIBUTION WIDTH: 16 % — ABNORMAL HIGH (ref 12.0–15.0)
WBC ADJUSTED: 10.4 10*9/L (ref 4.5–11.0)

## 2018-04-01 LAB — HEPATIC FUNCTION PANEL
ALBUMIN: 4.3 g/dL (ref 3.5–5.0)
ALKALINE PHOSPHATASE: 59 U/L (ref 38–126)
ALT (SGPT): 17 U/L (ref 13–69)
BILIRUBIN DIRECT: 0.1 mg/dL (ref 0.00–0.40)
BILIRUBIN TOTAL: 0.3 mg/dL (ref 0.0–1.2)

## 2018-04-01 LAB — MICROCYTES

## 2018-04-01 LAB — BASIC METABOLIC PANEL
BLOOD UREA NITROGEN: 15 mg/dL (ref 7–21)
BUN / CREAT RATIO: 15
CALCIUM: 9.3 mg/dL (ref 8.5–10.2)
CHLORIDE: 97 mmol/L — ABNORMAL LOW (ref 98–107)
CO2: 29 mmol/L (ref 22.0–30.0)
CREATININE: 1.02 mg/dL — ABNORMAL HIGH (ref 0.60–1.00)
EGFR CKD-EPI AA FEMALE: 69 mL/min/{1.73_m2} (ref >=60)
EGFR CKD-EPI NON-AA FEMALE: 60 mL/min/{1.73_m2} (ref >=60)
GLUCOSE RANDOM: 150 mg/dL (ref 65–179)
SODIUM: 135 mmol/L (ref 135–145)

## 2018-04-01 LAB — POLYCHROMASIA

## 2018-04-01 LAB — SLIDE REVIEW

## 2018-04-01 LAB — ALKALINE PHOSPHATASE: Alkaline phosphatase:CCnc:Pt:Ser/Plas:Qn:: 59

## 2018-04-01 LAB — CHLORIDE: Chloride:SCnc:Pt:Ser/Plas:Qn:: 97 — ABNORMAL LOW

## 2018-04-01 NOTE — Unmapped (Signed)
[facility]    Marie Fletcher, M.D.  Professor of Medicine  Director, [facility]  [place] [facility] [place] [facility] [place]    [phone]    Referring MD: Unknown Per Patient Pcp  No address on file    Reason For Office Visit: Treatment follow up HCV, treatment naive, genotype 1A  Epclusa x 12 weeks  Start Date: December 24, 2017 - March 17, 2018  EOT    History of Present Illness:  Marie Fletcher is a 62 y.o. female who presents for treatment follow up ~ EOT. She adhered to taking one tablet of Epclusa daily at 10 AM. Denies any alcohol use. Stopped taking Omeprazole 20 mg daily prior to initiation of treatment. She did experience shaking sensation ~ 30 minutes after having taken the medication, but not sure if related to having taken her prednisone around the same timeframe. Since stopping Eplcusa the shaking has lessen.      She presents alone today. She presents with numerous complaints again. Medical history significant for Bullous pemphigoid. Under the care of dermatology at [facility]. She is very discouraged by not being able to decrease dose of Prednisone without having flare up of skin eruptions. Additionally, complaint of noted weight gain and generalized pain, but mostly effecting her hips and legs. She was prescribed Voltaren gel and unable to avoid her medication. She just received Medicaid yesterday and will see if coverage is better now. Complaint of her nails splitting. She had stopped Vitamin B12 during treatment as well as her iron supplementation. Advised she can resume both at this time. + fecal occult test. Pending upcoming colonoscopy. Complaint of diarrhea.     She remembers first learning that she had hepatitis C about 15 years ago. She has never been treated. She reports that her husband also has hepatitis C, he has still not been treated for hepatitis C. He needs to be reevaluated again. 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 She denies any tattoos or unprofessional piercing. She reports that all of her children have been tested for hepatitis C and non reactive results.    Liver Section:  1. Chronic hepatitis C:   Baseline HCV RNA: 2,060,054 IU/mL (09/12/17).  HCV RNA TW #5- not detected   HCV RNA EOT- not detected     2.  + immunity hepatitis B.  3.  Immunity status hepatitis A: #1 vaccine administered 01/28/2018.   4.  HIV negative 10/17/17.   5.  Liver screening: RUQ [place] 10/22/17 which showed normal appearing liver with no liver lesions present, did show evidence of cholecystectomy and normal CBD.   6.  FibroScan: 6.6 KPS c/w stage F1 mild fibrosis    Review of Systems:  All systems reviewed and negative except in HPI.     PMH/PSH:  Past Medical History:   Diagnosis Date   ??? A-fib (CMS-HCC)    ??? Arthritis    ??? Asthma    ??? Bullous pemphigoid    ??? Coronary artery disease    ??? Disease of thyroid gland    ??? Heart attack (CMS-HCC) 1998   ??? Hepatitis C    ??? History of transfusion    ??? Hypertension    ??? Stroke (CMS-HCC)      Past Surgical History:   Procedure Laterality Date   ??? appercanes removal  1984   ??? CHOLECYSTECTOMY OPEN     ???  COLON SURGERY     ??? GASTRIC BYPASS  1995   ??? GASTROPLASTY VERTICAL BANDED  1992   ??? HERNIA REPAIR     ??? HYSTERECTOMY     ??? PR UPPER GI ENDOSCOPY,BIOPSY N/A 05/21/2016    Procedure: UGI ENDOSCOPY; WITH BIOPSY, SINGLE OR MULTIPLE;  Surgeon: Rona Ravens, MD;  [facility];  Service: Gastroenterology   ??? PR UPPER GI ENDOSCOPY,DIAGNOSIS N/A 11/27/2017    Procedure: UGI ENDO, INCLUDE ESOPHAGUS, STOMACH, & DUODENUM &/OR JEJUNUM; DX W/WO COLLECTION SPECIMN, BY BRUSH OR WASH;  Surgeon: Janyth Pupa, MD;  [facility];  Service: Gastroenterology   ??? SKIN BIOPSY       Allergies   Allergen Reactions   ??? Penicillins Anaphylaxis and Rash   ??? Sulfa (Sulfonamide Antibiotics) Anaphylaxis   ??? Sulfasalazine Anaphylaxis   ??? Lavender      Sinus swelling, nausea   ??? Codeine Nausea Only     Other reaction(s): OTHER     Current Medications:  Current Outpatient Medications   Medication Sig Dispense Refill   ??? acetaminophen (TYLENOL) 500 MG tablet Take 500 mg by mouth every six (6) hours as needed for pain.     ??? albuterol HFA 90 mcg/actuation inhaler Inhale 2 puffs every four (4) hours as needed for wheezing. 3 Inhaler 0   ??? amLODIPine (NORVASC) 5 MG tablet Take 2 tablets (10 mg total) by mouth daily. 180 tablet 3   ??? cetirizine (ZYRTEC) 10 MG tablet Take 10 mg by mouth daily.      ??? citalopram (CELEXA) 40 MG tablet Take 1 tablet (40 mg total) by mouth daily. 90 tablet 3   ??? clobetasol (TEMOVATE) 0.05 % cream Apply topically Two (2) times a day. 60 g 0   ??? gabapentin (NEURONTIN) 400 MG capsule Take 2 capsules (800 mg total) by mouth Three (3) times a day. 90 capsule 11   ??? hydrOXYzine (ATARAX) 25 MG tablet Take 1 tablet (25 mg total) by mouth every eight (8) hours as needed for itching. 90 tablet 3   ??? levothyroxine (SYNTHROID) 75 MCG tablet Take 1 tablet (75 mcg total) by mouth daily. 30 tablet 11   ??? omeprazole (PRILOSEC) 20 MG capsule Take 1 capsule (20 mg total) by mouth daily. 90 capsule 3   ??? predniSONE (DELTASONE) 10 MG tablet Take 2 tablets (20 mg total) by mouth daily. 60 tablet 0   ??? triamcinolone (KENALOG) 0.1 % ointment Apply twice a day to affected areas 454 g 6   ??? diclofenac sodium (VOLTAREN) 1 % gel Apply 2 g topically Four (4) times a day. (Patient not taking: Reported on 04/01/2018) 100 g 0   ??? EPCLUSA tablet TAKE 1 TABLET BY MOUTH ONCE DAILY (Patient not taking: Reported on 04/01/2018) 28 each 2   ??? fluconazole (DIFLUCAN) 200 MG tablet Take 400 mg on day 1. Then take 200 mg daily for 13 days. (Patient not taking: Reported on 04/01/2018) 15 tablet 0   ??? nicotine (NICODERM CQ) 21 mg/24 hr patch APPLY 1 PATCH TO SKIN ONCE DAILY FOR 6 WEEKS (REMOVE OLD PATCH BEFORE APPLYING NEW PATCH ) (Patient not taking: Reported on 04/01/2018) 42 each 0   ??? nystatin (MYCOSTATIN) 100,000 unit/mL suspension Swish and spit 5mL TID (Patient not taking: Reported on 04/01/2018) 60 mL 1     No current facility-administered medications for this visit.      Social History Lives in [place] with her husband. Smokes  3 cigarettes per day (previously 3 ppd). Very rare alcohol use. No illicit drug use. Worked at [employer] in [employer]. 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      Social History     Tobacco Use   ??? Smoking status: Current Some Day Smoker     Packs/day: 0.25     Types: Cigarettes   ??? Smokeless tobacco: Never Used   ??? Tobacco comment: Three cigs daily    Substance Use Topics   ??? Alcohol use: No   ??? Drug use: No     Family History:  Family History   Problem Relation Age of Onset   ??? No Known Problems Mother    ??? No Known Problems Father    ??? No Known Problems Sister    ??? No Known Problems Brother    ??? No Known Problems Maternal Aunt    ??? No Known Problems Maternal Uncle    ??? No Known Problems Paternal Aunt    ??? No Known Problems Paternal Uncle    ??? No Known Problems Maternal Grandmother    ??? No Known Problems Maternal Grandfather    ??? No Known Problems Paternal Grandmother    ??? No Known Problems Paternal Grandfather    ??? Melanoma Neg Hx    ??? Basal cell carcinoma Neg Hx    ??? Squamous cell carcinoma Neg Hx    ??? Anesthesia problems Neg Hx    ??? Broken bones Neg Hx    ??? Cancer Neg Hx    ??? Clotting disorder Neg Hx    ??? Collagen disease Neg Hx    ??? Diabetes Neg Hx    ??? Dislocations Neg Hx    ??? Fibromyalgia Neg Hx    ??? Gout Neg Hx    ??? Hemophilia Neg Hx    ??? Osteoporosis Neg Hx    ??? Rheumatologic disease Neg Hx    ??? Scoliosis Neg Hx    ??? Severe sprains Neg Hx    ??? Sickle cell anemia Neg Hx    ??? Spinal Compression Fracture Neg Hx      Physical Examination:  BP 135/86  - Pulse 69  - Temp 37 ??C (98.6 ??F) (Oral)  - Resp 18  - Ht 167.6 cm (5' 5.98)  - Wt 92.3 kg (203 lb 6.4 oz)  - SpO2 95%  - BMI 32.85 kg/m??   General appearance - alert, well appearing, and in no distress, oriented to person, place, and time and normal appearing weight. Puffy face and appearance of long term steroid use.   Mental status - normal mood, behavior, speech, dress, motor activity, and thought processes  Neurological - screening mental status exam normal  Rest of PE deferred.     Lab:  Results for orders placed or performed in visit on 04/01/18   Basic metabolic panel   Result Value Ref Range    Sodium 135 135 - 145 mmol/L    Potassium 4.5 3.5 - 5.0 mmol/L    Chloride 97 (L) 98 - 107 mmol/L    CO2 29.0 22.0 - 30.0 mmol/L    Anion Gap 9 7 - 15 mmol/L    BUN 15 7 - 21 mg/dL    Creatinine 2.95 (H) 0.60 - 1.00 mg/dL    BUN/Creatinine Ratio 15     EGFR CKD-EPI Non-African American, Female 60 >=60 mL/min/1.48m2    EGFR CKD-EPI African American, Female 12 >=60 mL/min/1.30m2    Glucose 150 65 - 179 mg/dL  Calcium 9.3 8.5 - 10.2 mg/dL   Hepatic Function Panel   Result Value Ref Range    Albumin 4.3 3.5 - 5.0 g/dL    Total Protein 7.1 6.5 - 8.3 g/dL    Total Bilirubin 0.3 0.0 - 1.2 mg/dL    Bilirubin, Direct 8.11 0.00 - 0.40 mg/dL    AST 22 17 - 47 U/L    ALT 17 13 - 69 U/L    Alkaline Phosphatase 59 38 - 126 U/L   Hepatitis C RNA, Quantitative, PCR   Result Value Ref Range    HCV RNA Not Detected     HCV RNA (IU)      HCV RNA Log(10)      HCV RNA Comment     CBC w/ Differential   Result Value Ref Range    WBC 10.4 4.5 - 11.0 10*9/L    RBC 4.79 4.00 - 5.20 10*12/L    HGB 9.9 (L) 12.0 - 16.0 g/dL    HCT 91.4 (L) 78.2 - 46.0 %    MCV 72.5 (L) 80.0 - 100.0 fL    MCH 20.7 (L) 26.0 - 34.0 pg    MCHC 28.6 (L) 31.0 - 37.0 g/dL    RDW 95.6 (H) 21.3 - 15.0 %    MPV 8.3 7.0 - 10.0 fL    Platelet 311 150 - 440 10*9/L    Neutrophils % 88.3 %    Lymphocytes % 7.1 %    Monocytes % 3.1 %    Eosinophils % 0.3 %    Basophils % 0.3 %    Neutrophil Left Shift 1+ (A) Not Present    Absolute Neutrophils 9.2 (H) 2.0 - 7.5 10*9/L    Absolute Lymphocytes 0.7 (L) 1.5 - 5.0 10*9/L    Absolute Monocytes 0.3 0.2 - 0.8 10*9/L Absolute Eosinophils 0.0 0.0 - 0.4 10*9/L    Absolute Basophils 0.0 0.0 - 0.1 10*9/L    Large Unstained Cells 1 0 - 4 %    Microcytosis Moderate (A) Not Present    Anisocytosis Slight (A) Not Present    Hypochromasia Marked (A) Not Present   Morphology Review   Result Value Ref Range    Smear Review Comments See Comment (A) Undefined    Polychromasia Slight (A) Not Present     Assessment/Plan:  1. Chronic hepatitis C:    Marie Fletcher is a 62 y.o. with treatment naive chronic hepatitis C, genotype 1a who presents today for treatment follow up ~ EOT. She adhered to taking Epclusa one tablet daily. Denies having missed any doses. No alcohol use or anti acid use during treatment. Several complaints today as noted above. Complaints are not believed are related to DAA treatment. Minimal evidence of fibrosis per [facility].     ~ HCV safety labs ordered.   ~ Office follow up three months for SVR.  ~ Able to resume Vitamin B12 and iron supplementation at this time.      2.  Bullous Pemphigoid: She does not have any evidence of cirrhosis or advanced fibrosis so there are no contraindications to the use of methotrexate or other steroid sparing agents mentioned by Dermatology given her history of hepatitis C. + immunity hepatitis B. HBS-AG IS NEGATIVE BUT HAS PRIOR EXPOSURE TO HBV (HBCOREAB+). WOULD NEED HBV PROPHYLAXIS IF AGENTS SUCH AS RITUXIMAB CONSIDERED IN THE FUTURE.     3. Hepatitis A status: #2 hepatitis A vaccine warranted in future.     4. Alcohol assessment: Rare alcohol  use: Counseled on need for complete alcohol cessation given hepatitis C infection as well as possible initiation of MTX/other immunosuppressants.     5. +fecal occult result - pending scheduled colonoscopy in near future.      6.  Joint pain- Encouraged patient to present to her local pharmacy with her Medicaid care and attempt to purchase Voltaren gel. Use as directed.     All patient's questions were answered to her satisfaction during office visit today.     Rodman Key, DNP, FNP-BC  [facility]  [address] [place]  [place] [address]  Phone [phone]

## 2018-04-01 NOTE — Unmapped (Addendum)
1.  Laboratory studies performed today as part of continued hepatitis C care.   2.  Office follow up three months.   3.  Any questions or concern please notify our office.   4.  Okay to restart iron and vitamin B12 now that treatment is over.   5.  Present to local pharmacy to get Voltaren gel prescription filled.

## 2018-04-02 NOTE — Unmapped (Signed)
lab results, provider recommendations, and provided answer/information for pt's MyChart question. pt verbalized understanding with no further questions.

## 2018-04-02 NOTE — Unmapped (Signed)
attempted to reach patient for lab results, provider recommendations, and respond to pt's MyChart message.    will try again later.

## 2018-04-02 NOTE — Unmapped (Signed)
Called, was not able to lvm

## 2018-04-03 LAB — HEPATITIS C RNA, QUANTITATIVE, PCR: HCV RNA: NOT DETECTED

## 2018-04-03 LAB — HCV RNA: Hepatitis C virus RNA:PrThr:Pt:Ser/Plas:Ord:Probe.amp.tar: NOT DETECTED

## 2018-04-03 MED ORDER — METHOTREXATE SODIUM 2.5 MG TABLET
ORAL_TABLET | ORAL | 2 refills | 0 days | Status: CP
Start: 2018-04-03 — End: 2018-05-26

## 2018-04-03 MED ORDER — PREDNISONE 10 MG TABLET
ORAL_TABLET | Freq: Every day | ORAL | 2 refills | 0 days | Status: CP
Start: 2018-04-03 — End: 2018-05-12

## 2018-04-03 MED ORDER — FOLIC ACID 1 MG TABLET
ORAL_TABLET | 3 refills | 0 days | Status: CP
Start: 2018-04-03 — End: 2019-02-20

## 2018-04-04 NOTE — Unmapped (Signed)
Patient completed treatment for hepatitis and is cleared to start methotrexate by GI.    She is currently on pred 20, needs refills; if goes below 20mg  daily she flares with blisters.    Recent labs reviewed; she is anemic Hgb 9.9, but this is relatively normal for her.  She plans to start vit B and iron.      Plan to start MTX 10mg  weekly with folic acid supplementation daily.  Repeat labs in 1 month (location TBD).  Follow up in 6-8 weeks in resident CC.

## 2018-04-09 MED ORDER — ALBUTEROL SULFATE HFA 90 MCG/ACTUATION AEROSOL INHALER
RESPIRATORY_TRACT | 0 refills | 0 days | Status: CP | PRN
Start: 2018-04-09 — End: 2019-02-03

## 2018-04-15 MED ORDER — FLUCONAZOLE 200 MG TABLET
ORAL_TABLET | 0 refills | 0 days | Status: CP
Start: 2018-04-15 — End: 2018-05-12

## 2018-04-16 NOTE — Unmapped (Signed)
Hey,    I received this today, can you please advise her in the myChart app?    Thanks    [employer]

## 2018-04-20 ENCOUNTER — Inpatient Hospital Stay
Admission: EM | Admit: 2018-04-20 | Discharge: 2018-04-28 | DRG: 388 | Disposition: A | Discharge status: Home Health | Payer: Medicaid Other | Attending: Internal Medicine | Admitting: Internal Medicine

## 2018-04-20 ENCOUNTER — Encounter: Payer: Self-pay | Admitting: Emergency Medicine

## 2018-04-20 ENCOUNTER — Emergency Department: Payer: Medicaid Other

## 2018-04-20 ENCOUNTER — Other Ambulatory Visit: Payer: Self-pay

## 2018-04-20 DIAGNOSIS — Z79899 Other long term (current) drug therapy: Secondary | ICD-10-CM

## 2018-04-20 DIAGNOSIS — K5900 Constipation, unspecified: Secondary | ICD-10-CM | POA: Diagnosis present

## 2018-04-20 DIAGNOSIS — F329 Major depressive disorder, single episode, unspecified: Secondary | ICD-10-CM | POA: Diagnosis present

## 2018-04-20 DIAGNOSIS — K641 Second degree hemorrhoids: Secondary | ICD-10-CM | POA: Diagnosis present

## 2018-04-20 DIAGNOSIS — F4325 Adjustment disorder with mixed disturbance of emotions and conduct: Secondary | ICD-10-CM | POA: Diagnosis present

## 2018-04-20 DIAGNOSIS — S2232XA Fracture of one rib, left side, initial encounter for closed fracture: Secondary | ICD-10-CM | POA: Diagnosis not present

## 2018-04-20 DIAGNOSIS — K5730 Diverticulosis of large intestine without perforation or abscess without bleeding: Secondary | ICD-10-CM | POA: Diagnosis present

## 2018-04-20 DIAGNOSIS — Z9109 Other allergy status, other than to drugs and biological substances: Secondary | ICD-10-CM

## 2018-04-20 DIAGNOSIS — Z9884 Bariatric surgery status: Secondary | ICD-10-CM

## 2018-04-20 DIAGNOSIS — I4891 Unspecified atrial fibrillation: Secondary | ICD-10-CM | POA: Diagnosis present

## 2018-04-20 DIAGNOSIS — G4700 Insomnia, unspecified: Secondary | ICD-10-CM | POA: Diagnosis present

## 2018-04-20 DIAGNOSIS — D120 Benign neoplasm of cecum: Secondary | ICD-10-CM

## 2018-04-20 DIAGNOSIS — J441 Chronic obstructive pulmonary disease with (acute) exacerbation: Secondary | ICD-10-CM | POA: Diagnosis present

## 2018-04-20 DIAGNOSIS — J440 Chronic obstructive pulmonary disease with acute lower respiratory infection: Secondary | ICD-10-CM | POA: Diagnosis present

## 2018-04-20 DIAGNOSIS — R1084 Generalized abdominal pain: Secondary | ICD-10-CM | POA: Diagnosis present

## 2018-04-20 DIAGNOSIS — K635 Polyp of colon: Secondary | ICD-10-CM

## 2018-04-20 DIAGNOSIS — D125 Benign neoplasm of sigmoid colon: Secondary | ICD-10-CM | POA: Diagnosis present

## 2018-04-20 DIAGNOSIS — Z6835 Body mass index (BMI) 35.0-35.9, adult: Secondary | ICD-10-CM

## 2018-04-20 DIAGNOSIS — I10 Essential (primary) hypertension: Secondary | ICD-10-CM | POA: Diagnosis present

## 2018-04-20 DIAGNOSIS — X58XXXA Exposure to other specified factors, initial encounter: Secondary | ICD-10-CM | POA: Diagnosis not present

## 2018-04-20 DIAGNOSIS — Y92230 Patient room in hospital as the place of occurrence of the external cause: Secondary | ICD-10-CM | POA: Diagnosis not present

## 2018-04-20 DIAGNOSIS — Z8249 Family history of ischemic heart disease and other diseases of the circulatory system: Secondary | ICD-10-CM

## 2018-04-20 DIAGNOSIS — Z7952 Long term (current) use of systemic steroids: Secondary | ICD-10-CM

## 2018-04-20 DIAGNOSIS — K449 Diaphragmatic hernia without obstruction or gangrene: Secondary | ICD-10-CM | POA: Diagnosis present

## 2018-04-20 DIAGNOSIS — J181 Lobar pneumonia, unspecified organism: Secondary | ICD-10-CM | POA: Diagnosis present

## 2018-04-20 DIAGNOSIS — Z8619 Personal history of other infectious and parasitic diseases: Secondary | ICD-10-CM

## 2018-04-20 DIAGNOSIS — M25552 Pain in left hip: Secondary | ICD-10-CM

## 2018-04-20 DIAGNOSIS — R140 Abdominal distension (gaseous): Secondary | ICD-10-CM

## 2018-04-20 DIAGNOSIS — L120 Bullous pemphigoid: Secondary | ICD-10-CM | POA: Diagnosis present

## 2018-04-20 DIAGNOSIS — Z79890 Hormone replacement therapy (postmenopausal): Secondary | ICD-10-CM

## 2018-04-20 DIAGNOSIS — Z79891 Long term (current) use of opiate analgesic: Secondary | ICD-10-CM

## 2018-04-20 DIAGNOSIS — E669 Obesity, unspecified: Secondary | ICD-10-CM | POA: Diagnosis present

## 2018-04-20 DIAGNOSIS — K56609 Unspecified intestinal obstruction, unspecified as to partial versus complete obstruction: Principal | ICD-10-CM | POA: Diagnosis present

## 2018-04-20 DIAGNOSIS — Z880 Allergy status to penicillin: Secondary | ICD-10-CM

## 2018-04-20 DIAGNOSIS — E1140 Type 2 diabetes mellitus with diabetic neuropathy, unspecified: Secondary | ICD-10-CM | POA: Diagnosis present

## 2018-04-20 DIAGNOSIS — D509 Iron deficiency anemia, unspecified: Secondary | ICD-10-CM | POA: Diagnosis present

## 2018-04-20 DIAGNOSIS — J189 Pneumonia, unspecified organism: Secondary | ICD-10-CM

## 2018-04-20 DIAGNOSIS — F419 Anxiety disorder, unspecified: Secondary | ICD-10-CM | POA: Diagnosis present

## 2018-04-20 DIAGNOSIS — F17200 Nicotine dependence, unspecified, uncomplicated: Secondary | ICD-10-CM | POA: Diagnosis present

## 2018-04-20 DIAGNOSIS — Z882 Allergy status to sulfonamides status: Secondary | ICD-10-CM

## 2018-04-20 DIAGNOSIS — K5909 Other constipation: Secondary | ICD-10-CM | POA: Diagnosis not present

## 2018-04-20 DIAGNOSIS — D122 Benign neoplasm of ascending colon: Secondary | ICD-10-CM

## 2018-04-20 DIAGNOSIS — R933 Abnormal findings on diagnostic imaging of other parts of digestive tract: Secondary | ICD-10-CM | POA: Diagnosis not present

## 2018-04-20 DIAGNOSIS — M329 Systemic lupus erythematosus, unspecified: Secondary | ICD-10-CM | POA: Diagnosis present

## 2018-04-20 DIAGNOSIS — Z90710 Acquired absence of both cervix and uterus: Secondary | ICD-10-CM

## 2018-04-20 DIAGNOSIS — Z885 Allergy status to narcotic agent status: Secondary | ICD-10-CM

## 2018-04-20 HISTORY — DX: Polyneuropathy, unspecified: G62.9

## 2018-04-20 HISTORY — DX: Unspecified osteoarthritis, unspecified site: M19.90

## 2018-04-20 HISTORY — DX: Systemic lupus erythematosus, unspecified: M32.9

## 2018-04-20 HISTORY — DX: Bullous pemphigoid: L12.0

## 2018-04-20 HISTORY — DX: Reserved for concepts with insufficient information to code with codable children: IMO0002

## 2018-04-20 HISTORY — DX: Type 2 diabetes mellitus without complications: E11.9

## 2018-04-20 LAB — CBC
HEMATOCRIT: 33.5 % — AB (ref 36.0–46.0)
HEMOGLOBIN: 9.6 g/dL — AB (ref 12.0–15.0)
MCH: 21.4 pg — ABNORMAL LOW (ref 26.0–34.0)
MCHC: 28.7 g/dL — ABNORMAL LOW (ref 30.0–36.0)
MCV: 74.8 fL — ABNORMAL LOW (ref 80.0–100.0)
NRBC: 0 % (ref 0.0–0.2)
Platelets: 398 10*3/uL (ref 150–400)
RBC: 4.48 MIL/uL (ref 3.87–5.11)
RDW: 21.2 % — AB (ref 11.5–15.5)
WBC: 13.4 10*3/uL — AB (ref 4.0–10.5)

## 2018-04-20 LAB — COMPREHENSIVE METABOLIC PANEL
ALT: 17 U/L (ref 0–44)
AST: 34 U/L (ref 15–41)
Albumin: 3.7 g/dL (ref 3.5–5.0)
Alkaline Phosphatase: 65 U/L (ref 38–126)
Anion gap: 9 (ref 5–15)
BILIRUBIN TOTAL: 0.7 mg/dL (ref 0.3–1.2)
BUN: 10 mg/dL (ref 8–23)
CHLORIDE: 98 mmol/L (ref 98–111)
CO2: 27 mmol/L (ref 22–32)
Calcium: 9 mg/dL (ref 8.9–10.3)
Creatinine, Ser: 1.05 mg/dL — ABNORMAL HIGH (ref 0.44–1.00)
GFR calc non Af Amer: 56 mL/min — ABNORMAL LOW (ref >60)
Glucose, Bld: 171 mg/dL — ABNORMAL HIGH (ref 70–99)
POTASSIUM: 4.4 mmol/L (ref 3.5–5.1)
Sodium: 134 mmol/L — ABNORMAL LOW (ref 135–145)
TOTAL PROTEIN: 6.8 g/dL (ref 6.5–8.1)

## 2018-04-20 LAB — URINALYSIS, COMPLETE (UACMP) WITH MICROSCOPIC
BACTERIA UA: NONE SEEN
BILIRUBIN URINE: NEGATIVE
Glucose, UA: 150 mg/dL — AB
KETONES UR: NEGATIVE mg/dL
LEUKOCYTES UA: NEGATIVE
NITRITE: NEGATIVE
Protein, ur: NEGATIVE mg/dL
Specific Gravity, Urine: 1.005 (ref 1.005–1.030)
pH: 7 (ref 5.0–8.0)

## 2018-04-20 LAB — LIPASE, BLOOD: LIPASE: 23 U/L (ref 11–51)

## 2018-04-20 MED ORDER — ONDANSETRON HCL 4 MG/2ML IJ SOLN
4.0000 mg | Freq: Once | INTRAMUSCULAR | Status: AC
  Administered 2018-04-20: 4 mg via INTRAVENOUS

## 2018-04-20 MED ORDER — IOPAMIDOL (ISOVUE-300) INJECTION 61%
100.0000 mL | Freq: Once | INTRAVENOUS | Status: AC | PRN
  Administered 2018-04-20: 100 mL via INTRAVENOUS
  Filled 2018-04-20: qty 100

## 2018-04-20 MED ORDER — MORPHINE SULFATE (PF) 4 MG/ML IV SOLN
4.0000 mg | Freq: Once | INTRAVENOUS | Status: AC
  Administered 2018-04-20: 4 mg via INTRAVENOUS

## 2018-04-20 MED ORDER — MORPHINE SULFATE (PF) 4 MG/ML IV SOLN
INTRAVENOUS | Status: AC
  Administered 2018-04-20: 4 mg via INTRAVENOUS
  Filled 2018-04-20: qty 1

## 2018-04-20 MED ORDER — ONDANSETRON HCL 4 MG/2ML IJ SOLN
INTRAMUSCULAR | Status: AC
  Administered 2018-04-20: 4 mg via INTRAVENOUS
  Filled 2018-04-20: qty 2

## 2018-04-20 MED ORDER — MORPHINE SULFATE (PF) 4 MG/ML IV SOLN
4.0000 mg | Freq: Once | INTRAVENOUS | Status: AC
  Administered 2018-04-20: 4 mg via INTRAVENOUS
  Filled 2018-04-20: qty 1

## 2018-04-20 MED ORDER — SODIUM CHLORIDE 0.9 % IV BOLUS
1000.0000 mL | Freq: Once | INTRAVENOUS | Status: AC
  Administered 2018-04-20: 1000 mL via INTRAVENOUS

## 2018-04-20 NOTE — ED Notes (Signed)
ED Provider at bedside. 

## 2018-04-20 NOTE — ED Triage Notes (Signed)
Pt to ED via POV c/o LUQ abdominal pain x 3 days. Pt states that she has had N/V as well. Pt states that she is unable to remember when her last BM was, pt thinks she may be impacted. Pt states that she has had 13 abdominal surgeries. Pt is in NAD at this time.

## 2018-04-20 NOTE — ED Notes (Signed)
Patient c/o left abdominal pain X 3 days. Patient has bruise to left abdomen. Patient reports ongoing cough. Patient reports having one sharp cough and then immediately having abdominal pain after.

## 2018-04-20 NOTE — ED Notes (Signed)
Patient transported to CT 

## 2018-04-20 NOTE — ED Provider Notes (Signed)
[facility] Emergency Department Provider Note   None    (approximate)  I have reviewed the triage vital signs and the nursing notes.   HISTORY  Chief Complaint Abdominal Pain    HPI Christine Nichols is a 62 y.o. female low list of chronic medical conditions presents to the emergency department with generalized abdominal discomfort is worse in the left upper/lower quadrant with associated overlying skin bruises which patient states she does not recall any injury.  Patient admits to constipation x5 days.  Patient denies any fever.  Patient also admits to nausea and vomiting that is nonbloody.  Past Medical History:  Diagnosis Date  . Arthritis   . Bullous pemphigoid   . Diabetes mellitus without complication ([place])   . Hypertension   . Lupus ([place])   . Neuropathy     Patient Active Problem List   Diagnosis Date Noted  . Chronic hepatitis C without hepatic coma ([place])   . Varicella 10/16/2017    Past Surgical History:  Procedure Laterality Date  . ABDOMINAL HYSTERECTOMY    . ABDOMINAL SURGERY      Prior to Admission medications   Medication Sig Start Date End Date Taking? Authorizing Provider  albuterol (PROVENTIL HFA;VENTOLIN HFA) 108 (90 Base) MCG/ACT inhaler Inhale 2 puffs into the lungs every 6 (six) hours as needed for wheezing or shortness of breath.    [provider]  citalopram (CELEXA) 40 MG tablet Take 1 tablet by mouth daily. 10/10/17   [provider]  clobetasol cream (TEMOVATE) 0.05 % Apply topically 2 (two) times daily. 10/24/17   [name], [name], MD  docusate sodium (COLACE) 100 MG capsule Take 1 tablet once or twice daily as needed for constipation while taking narcotic pain medicine Patient not taking: Reported on 10/16/2017 08/09/17   [name], MD  famotidine (PEPCID) 20 MG tablet Take 1 tablet (20 mg total) by mouth daily. 10/25/17   [name], [name], MD  gabapentin (NEURONTIN) 100 MG capsule Take 1 capsule  (100 mg total) by mouth 3 (three) times daily. 10/24/17 10/24/18  [name], [name], MD  HYDROcodone-acetaminophen (NORCO/VICODIN) 5-325 MG tablet Take 1-2 tablets by mouth every 6 (six) hours as needed for moderate pain or severe pain. Patient not taking: Reported on 10/16/2017 08/09/17   [name], MD  hydrOXYzine (ATARAX/VISTARIL) 25 MG tablet Take 1 tablet (25 mg total) by mouth every 8 (eight) hours as needed for anxiety or itching. 10/24/17   [name], [name], MD  levothyroxine (SYNTHROID, LEVOTHROID) 75 MCG tablet Take 75 mcg by mouth daily before breakfast.    [provider]  nicotine (NICODERM CQ - DOSED IN MG/24 HOURS) 21 mg/24hr patch Place 1 patch (21 mg total) onto the skin daily. 10/25/17   [name], [name], MD  predniSONE (DELTASONE) 20 MG tablet Take 3 tablets (60 mg total) by mouth daily with breakfast. 10/25/17   [name], [name], MD    Allergies Sulfa antibiotics; Codeine; and Penicillins  No family history on file.  Social History Social History   Tobacco Use  . Smoking status: Heavy Tobacco Smoker  . Smokeless tobacco: Never Used  Substance Use Topics  . Alcohol use: Never    Frequency: Never  . Drug use: Never    Review of Systems Constitutional: No fever/chills Eyes: No visual changes. ENT: No sore throat. Cardiovascular: Denies chest pain. Respiratory: Denies shortness of breath. Gastrointestinal: Positive for abdominal pain nausea vomiting constipation. Genitourinary: Negative for dysuria. Musculoskeletal: Negative  for neck pain.  Negative for back pain. Integumentary: Bruising to the left abdominal wall Neurological: Negative for headaches, focal weakness or numbness.  ____________________________________________   PHYSICAL EXAM:  VITAL SIGNS: ED Triage Vitals  Enc Vitals Group     BP 04/20/18 1552 (!) 144/97     Pulse Rate 04/20/18 1552 97     Resp 04/20/18 1552 16     Temp 04/20/18 1552 98.8 F (37.1 C)     Temp Source 04/20/18 1552  Oral     SpO2 04/20/18 1552 92 %     Weight --      Height --      Head Circumference --      Peak Flow --      Pain Score 04/20/18 1553 10     Pain Loc --      Pain Edu? --      Excl. in [place]? --     Constitutional: Alert and oriented.  Apparent discomfort Eyes: Conjunctivae are normal. Mouth/Throat: Mucous membranes are moist.  Oropharynx non-erythematous. Neck: No stridor.   Cardiovascular: Normal rate, regular rhythm. Good peripheral circulation. Grossly normal heart sounds. Respiratory: Normal respiratory effort.  No retractions. Lungs CTAB. Gastrointestinal: Generalized tenderness to palpation worse in the left upper quadrant. No distention.  Musculoskeletal: No lower extremity tenderness nor edema. No gross deformities of extremities. Neurologic:  Normal speech and language. No gross focal neurologic deficits are appreciated.  Skin:  Skin is warm, dry and intact. No rash noted. Psychiatric: Mood and affect are normal. Speech and behavior are normal.  ____________________________________________   LABS (all labs ordered are listed, but only abnormal results are displayed)  Labs Reviewed  COMPREHENSIVE METABOLIC PANEL - Abnormal; Notable for the following components:      Result Value   Sodium 134 (*)    Glucose, Bld 171 (*)    Creatinine, Ser 1.05 (*)    GFR calc non Af Amer 56 (*)    All other components within normal limits  CBC - Abnormal; Notable for the following components:   WBC 13.4 (*)    Hemoglobin 9.6 (*)    HCT 33.5 (*)    MCV 74.8 (*)    MCH 21.4 (*)    MCHC 28.7 (*)    RDW 21.2 (*)    All other components within normal limits  URINALYSIS, COMPLETE (UACMP) WITH MICROSCOPIC - Abnormal; Notable for the following components:   Color, Urine YELLOW (*)    APPearance CLEAR (*)    Glucose, UA 150 (*)    Hgb urine dipstick SMALL (*)    All other components within normal limits  LIPASE, BLOOD   ____________________________________________  EKG  ED ECG  REPORT I, Irene N Raeleigh Guinn, the attending physician, personally viewed and interpreted this ECG.   Date: 04/20/2018  EKG Time: 3:55 PM  Rate: 90  Rhythm: Normal sinus rhythm  Axis: Normal  Intervals: Short PR interval  ST&T Change: None  ____________________________________________  RADIOLOGY I, [place] Sonu Kruckenberg, personally viewed and evaluated these images (plain radiographs) as part of my medical decision making, as well as reviewing the written report by the radiologist.  ED MD interpretation: CT scan revealed dilated a sending and transverse colon with stool volume with thickened area at the splenic flexure concerning for malignant lesion.  Official radiology report(s): Dg Abdomen Acute W/chest  Result Date: 04/20/2018 CLINICAL DATA:  Patient reports left upper quadrant abdominal pain for past 3 days. Patient reports pain resolves after she sits  still for a few minutes, but returns as soon as she attempts to move. Patient reports hx of 13 separate abdominal sx's. Hx hysterectomy, DM, lupus. Current smoker EXAM: DG ABDOMEN ACUTE W/ 1V CHEST COMPARISON:  Chest radiographs, 08/08/2017. Abdomen and pelvis CT, 05/29/2012. FINDINGS: There is no bowel dilation to suggest obstruction. No air-fluid levels. No evidence of a significant adynamic ileus. No free air. Numerous surgical vascular clips are noted in the left upper quadrant. Bowel anastomosis staples noted in the left mid abdomen. Mild to moderate increased colonic stool burden. Cardiac silhouette is normal in size. There are prominent bronchovascular markings bilaterally. No lung consolidation. Small left pleural effusion. IMPRESSION: 1. No acute findings the abdomen or pelvis. No bowel obstruction or free air. 2. Mild to moderate increase colonic stool burden. 3. Small left pleural effusion.  No acute cardiopulmonary disease. Electronically Signed   By: Lajean Manes M.D.   On: 04/20/2018 18:15     ____________________________________________    Procedures   ____________________________________________   INITIAL IMPRESSION / ASSESSMENT AND PLAN / ED COURSE  As part of my medical decision making, I reviewed the following data within the electronic MEDICAL RECORD NUMBER   62 year old female presenting with above-stated history and physical exam secondary to abdominal discomfort.  CT scan revealed thickening of the colon at the splenic flexure concerning for malignancy.  Patient requiring multiple doses of IV morphine in the emergency department for pain control without pain improvement current pain score 9 out of 10.  Patient discussed with Dr. Bjorn Loser hospitalist on-call for hospital admission further evaluation and management including colonoscopy.    ____________________________________________  FINAL CLINICAL IMPRESSION(S) / ED DIAGNOSES  Final diagnoses:  Large bowel obstruction ([place])     MEDICATIONS GIVEN DURING THIS VISIT:  Medications  morphine 4 MG/ML injection (has no administration in time range)  ondansetron (ZOFRAN) 4 MG/2ML injection (has no administration in time range)     ED Discharge Orders    None       Note:  This document was prepared using Dragon voice recognition software and may include unintentional dictation errors.    Gregor Hams, MD 04/20/18 2306

## 2018-04-21 DIAGNOSIS — K56609 Unspecified intestinal obstruction, unspecified as to partial versus complete obstruction: Principal | ICD-10-CM

## 2018-04-21 LAB — IRON AND TIBC
Iron: 71 ug/dL (ref 28–170)
SATURATION RATIOS: 22 % (ref 10.4–31.8)
TIBC: 324 ug/dL (ref 250–450)
UIBC: 253 ug/dL

## 2018-04-21 LAB — CBC
HCT: 31.8 % — ABNORMAL LOW (ref 36.0–46.0)
HEMOGLOBIN: 8.7 g/dL — AB (ref 12.0–15.0)
MCH: 21.2 pg — ABNORMAL LOW (ref 26.0–34.0)
MCHC: 27.4 g/dL — ABNORMAL LOW (ref 30.0–36.0)
MCV: 77.4 fL — ABNORMAL LOW (ref 80.0–100.0)
Platelets: 350 10*3/uL (ref 150–400)
RBC: 4.11 MIL/uL (ref 3.87–5.11)
RDW: 21.2 % — ABNORMAL HIGH (ref 11.5–15.5)
WBC: 11.3 10*3/uL — AB (ref 4.0–10.5)
nRBC: 0 % (ref 0.0–0.2)

## 2018-04-21 LAB — BASIC METABOLIC PANEL
Anion gap: 7 (ref 5–15)
BUN: 10 mg/dL (ref 8–23)
CO2: 30 mmol/L (ref 22–32)
Calcium: 8.6 mg/dL — ABNORMAL LOW (ref 8.9–10.3)
Chloride: 101 mmol/L (ref 98–111)
Creatinine, Ser: 0.96 mg/dL (ref 0.44–1.00)
GFR calc Af Amer: >60 mL/min (ref >60)
Glucose, Bld: 113 mg/dL — ABNORMAL HIGH (ref 70–99)
POTASSIUM: 3.9 mmol/L (ref 3.5–5.1)
SODIUM: 138 mmol/L (ref 135–145)

## 2018-04-21 LAB — GLUCOSE, CAPILLARY
Glucose-Capillary: 125 mg/dL — ABNORMAL HIGH (ref 70–99)
Glucose-Capillary: 175 mg/dL — ABNORMAL HIGH (ref 70–99)

## 2018-04-21 LAB — FERRITIN: FERRITIN: 28 ng/mL (ref 11–307)

## 2018-04-21 LAB — PROCALCITONIN: Procalcitonin: <0.1 ng/mL

## 2018-04-21 MED ORDER — POLYETHYLENE GLYCOL 3350 17 G PO PACK
17.0000 g | PACK | Freq: Two times a day (BID) | ORAL | Status: DC
  Administered 2018-04-21 – 2018-04-23 (×4): 17 g via ORAL
  Filled 2018-04-21 (×5): qty 1

## 2018-04-21 MED ORDER — TRAZODONE HCL 50 MG PO TABS: 25.0000 mg | ORAL_TABLET | Freq: Every evening | ORAL | Status: DC | PRN

## 2018-04-21 MED ORDER — BISACODYL 5 MG PO TBEC
5.0000 mg | DELAYED_RELEASE_TABLET | Freq: Every day | ORAL | Status: DC | PRN
  Administered 2018-04-22 – 2018-04-27 (×3): 5 mg via ORAL
  Filled 2018-04-21 (×2): qty 1

## 2018-04-21 MED ORDER — METHYLPREDNISOLONE SODIUM SUCC 40 MG IJ SOLR
40.0000 mg | Freq: Two times a day (BID) | INTRAMUSCULAR | Status: DC
  Administered 2018-04-21 – 2018-04-25 (×9): 40 mg via INTRAVENOUS
  Filled 2018-04-21 (×9): qty 1

## 2018-04-21 MED ORDER — ORAL CARE MOUTH RINSE
15.0000 mL | Freq: Two times a day (BID) | OROMUCOSAL | Status: DC
  Administered 2018-04-21 – 2018-04-27 (×11): 15 mL via OROMUCOSAL

## 2018-04-21 MED ORDER — ALBUTEROL SULFATE (2.5 MG/3ML) 0.083% IN NEBU
2.5000 mg | INHALATION_SOLUTION | RESPIRATORY_TRACT | Status: DC | PRN
  Administered 2018-04-22 – 2018-04-23 (×2): 2.5 mg via RESPIRATORY_TRACT
  Filled 2018-04-21 (×2): qty 3

## 2018-04-21 MED ORDER — SODIUM CHLORIDE 0.9 % IV SOLN
500.0000 mg | INTRAVENOUS | Status: DC
  Administered 2018-04-21 – 2018-04-25 (×4): 500 mg via INTRAVENOUS
  Filled 2018-04-21 (×6): qty 500

## 2018-04-21 MED ORDER — ONDANSETRON HCL 4 MG PO TABS: 4.0000 mg | ORAL_TABLET | Freq: Four times a day (QID) | ORAL | Status: DC | PRN

## 2018-04-21 MED ORDER — SODIUM CHLORIDE 0.9 % IV SOLN
1.0000 g | INTRAVENOUS | Status: AC
  Administered 2018-04-21 – 2018-04-25 (×5): 1 g via INTRAVENOUS
  Filled 2018-04-21 (×2): qty 10
  Filled 2018-04-21 (×4): qty 1

## 2018-04-21 MED ORDER — INSULIN ASPART 100 UNIT/ML ~~LOC~~ SOLN
0.0000 [IU] | Freq: Four times a day (QID) | SUBCUTANEOUS | Status: DC
  Administered 2018-04-21 – 2018-04-22 (×3): 2 [IU] via SUBCUTANEOUS
  Administered 2018-04-22 – 2018-04-23 (×2): 3 [IU] via SUBCUTANEOUS
  Administered 2018-04-23: 1 [IU] via SUBCUTANEOUS
  Administered 2018-04-24 (×2): 2 [IU] via SUBCUTANEOUS
  Administered 2018-04-24: 1 [IU] via SUBCUTANEOUS
  Administered 2018-04-25: 2 [IU] via SUBCUTANEOUS
  Administered 2018-04-25: 1 [IU] via SUBCUTANEOUS
  Administered 2018-04-25 – 2018-04-26 (×3): 2 [IU] via SUBCUTANEOUS
  Administered 2018-04-27: 5 [IU] via SUBCUTANEOUS
  Administered 2018-04-27 – 2018-04-28 (×2): 1 [IU] via SUBCUTANEOUS
  Administered 2018-04-28: 5 [IU] via SUBCUTANEOUS
  Filled 2018-04-21 (×20): qty 1

## 2018-04-21 MED ORDER — HYDROMORPHONE HCL 1 MG/ML IJ SOLN
0.5000 mg | INTRAMUSCULAR | Status: DC | PRN
  Administered 2018-04-21 – 2018-04-22 (×4): 0.5 mg via INTRAVENOUS
  Filled 2018-04-21 (×4): qty 0.5

## 2018-04-21 MED ORDER — MAGNESIUM CITRATE PO SOLN
1.0000 | Freq: Once | ORAL | Status: AC
  Administered 2018-04-21: 1 via ORAL
  Filled 2018-04-21: qty 296

## 2018-04-21 MED ORDER — ONDANSETRON HCL 4 MG/2ML IJ SOLN
4.0000 mg | Freq: Four times a day (QID) | INTRAMUSCULAR | Status: DC | PRN
  Administered 2018-04-21 – 2018-04-28 (×5): 4 mg via INTRAVENOUS
  Filled 2018-04-21 (×5): qty 2

## 2018-04-21 MED ORDER — HYDRALAZINE HCL 20 MG/ML IJ SOLN
5.0000 mg | INTRAMUSCULAR | Status: DC | PRN
  Filled 2018-04-21: qty 1

## 2018-04-21 MED ORDER — FOLIC ACID 5 MG/ML IJ SOLN
1.0000 mg | Freq: Every day | INTRAMUSCULAR | Status: DC
  Administered 2018-04-21 – 2018-04-23 (×3): 1 mg via INTRAVENOUS
  Filled 2018-04-21 (×5): qty 0.2

## 2018-04-21 MED ORDER — ACETAMINOPHEN 325 MG PO TABS
650.0000 mg | ORAL_TABLET | Freq: Four times a day (QID) | ORAL | Status: DC | PRN
  Administered 2018-04-23: 650 mg via ORAL
  Filled 2018-04-21: qty 2

## 2018-04-21 MED ORDER — LORAZEPAM 2 MG/ML IJ SOLN
1.0000 mg | Freq: Four times a day (QID) | INTRAMUSCULAR | Status: DC | PRN
  Administered 2018-04-21 – 2018-04-22 (×2): 1 mg via INTRAVENOUS
  Filled 2018-04-21 (×2): qty 1

## 2018-04-21 MED ORDER — LORAZEPAM 2 MG/ML IJ SOLN
1.0000 mg | Freq: Every evening | INTRAMUSCULAR | Status: DC | PRN
  Administered 2018-04-21: 1 mg via INTRAVENOUS
  Filled 2018-04-21: qty 1

## 2018-04-21 MED ORDER — HYDROMORPHONE HCL 1 MG/ML IJ SOLN
0.5000 mg | INTRAMUSCULAR | Status: DC | PRN
  Administered 2018-04-21: 0.5 mg via INTRAVENOUS
  Filled 2018-04-21: qty 0.5

## 2018-04-21 MED ORDER — DOCUSATE SODIUM 100 MG PO CAPS
100.0000 mg | ORAL_CAPSULE | Freq: Two times a day (BID) | ORAL | Status: DC
  Administered 2018-04-21 – 2018-04-28 (×13): 100 mg via ORAL
  Filled 2018-04-21 (×15): qty 1

## 2018-04-21 MED ORDER — ACETAMINOPHEN 650 MG RE SUPP: 650.0000 mg | Freq: Four times a day (QID) | RECTAL | Status: DC | PRN

## 2018-04-21 MED ORDER — SODIUM CHLORIDE 0.9 % IV SOLN
INTRAVENOUS | Status: DC
  Administered 2018-04-21 – 2018-04-22 (×3): via INTRAVENOUS

## 2018-04-21 NOTE — Progress Notes (Signed)
   04/21/18 1100  Clinical Encounter Type  Visited With Patient  Visit Type Initial;Spiritual support (OR for AD)  Referral From Nurse  Recommendations Follow-up, as requested.  Spiritual Encounters  Spiritual Needs Emotional;Prayer  Stress Factors  Patient Stress Factors Health changes   Chaplain responded to OR for prayer and found that the patient was reflective and accepting of tomorrow's procedure and the possible cancer diagnosis. She discussed the lack of support from her husband but spoke lovingly of her daughter and granddaughter. With a series of co-morbid conditions, the patient has decided to accept God's will rather than remain angry at God. Patient "preached" a mini-sermon about following Jesus one day at a time. Chaplain offered presence, active engagement, and prayer.  Chaplain spoke to care team about the patient's level of pain.

## 2018-04-21 NOTE — Progress Notes (Signed)
SURGICAL CONSULTATION NOTE   HISTORY OF PRESENT ILLNESS (HPI):  62 y.o. female presented to [facility] ED for evaluation of abdominal pain. Patient reports having left sided abdominal pian since months ago but has been getting worse in the past few days. Pain radiates to the left upper back. There is no aggravator or alleviator factor. Patient also refers associated nausea.  Denies fever or chills. Denies weight loss. Denies rectal bleeding. Denies changes on her stool consistency. She refers chronic problem with constipation.   Patient very difficulty to get history. She was very anxious and hyperventilating. History all over the place with many other complains, such as skin erosions, that she is is going to die because they told her she has cancer, severe coughing, about loosing her job  Surgery is consulted by Dr. Brett Albino in this context for evaluation and management of large bowel obstruction.  PAST MEDICAL HISTORY (PMH):  Past Medical History:  Diagnosis Date  . Arthritis   . Bullous pemphigoid   . Diabetes mellitus without complication ([place])   . Hypertension   . Lupus ([place])   . Neuropathy      PAST SURGICAL HISTORY ([place]):  Past Surgical History:  Procedure Laterality Date  . ABDOMINAL HYSTERECTOMY    . ABDOMINAL SURGERY     Gastric bypass  Multiple surgeries due to small obstruction  Multiple unknown abdominal surgeries.   MEDICATIONS:  Prior to Admission medications   Medication Sig Start Date End Date Taking? Authorizing Provider  acetaminophen (TYLENOL) 500 MG tablet Take 500 mg by mouth every 6 (six) hours as needed for mild pain or moderate pain.   Yes [provider]  albuterol (PROVENTIL HFA;VENTOLIN HFA) 108 (90 Base) MCG/ACT inhaler Inhale 2 puffs into the lungs every 4 (four) hours as needed for wheezing or shortness of breath.    Yes [provider]  amLODipine (NORVASC) 5 MG tablet Take 10 mg by mouth daily.   Yes [provider]  cetirizine  (ZYRTEC) 10 MG tablet Take 10 mg by mouth daily.   Yes [provider]  citalopram (CELEXA) 40 MG tablet Take 1 tablet by mouth daily. 10/10/17  Yes [provider]  clobetasol cream (TEMOVATE) 0.05 % Apply topically 2 (two) times daily. 10/24/17  Yes [name], [name], MD  gabapentin (NEURONTIN) 400 MG capsule Take 800 mg by mouth 3 (three) times daily.   Yes [provider]  hydrOXYzine (ATARAX/VISTARIL) 25 MG tablet Take 1 tablet (25 mg total) by mouth every 8 (eight) hours as needed for anxiety or itching. 10/24/17  Yes [name], [name], MD  levothyroxine (SYNTHROID, LEVOTHROID) 75 MCG tablet Take 75 mcg by mouth daily before breakfast.   Yes [provider]  omeprazole (PRILOSEC) 20 MG capsule Take 20 mg by mouth daily.   Yes [provider]  predniSONE (DELTASONE) 20 MG tablet Take 3 tablets (60 mg total) by mouth daily with breakfast. 10/25/17  Yes [name], Montell D, MD  triamcinolone ointment (KENALOG) 0.1 % Apply 1 application topically 2 (two) times daily.   Yes [provider]  docusate sodium (COLACE) 100 MG capsule Take 1 tablet once or twice daily as needed for constipation while taking narcotic pain medicine Patient not taking: Reported on 10/16/2017 08/09/17   [name], MD  famotidine (PEPCID) 20 MG tablet Take 1 tablet (20 mg total) by mouth daily. Patient not taking: Reported on 04/21/2018 10/25/17   [name], Holly Bodily D, MD  gabapentin (NEURONTIN) 100 MG capsule Take 1 capsule (  100 mg total) by mouth 3 (three) times daily. Patient not taking: Reported on 04/21/2018 10/24/17 10/24/18  [name], [name], MD  HYDROcodone-acetaminophen (NORCO/VICODIN) 5-325 MG tablet Take 1-2 tablets by mouth every 6 (six) hours as needed for moderate pain or severe pain. Patient not taking: Reported on 10/16/2017 08/09/17   [name], MD  nicotine (NICODERM CQ - DOSED IN MG/24 HOURS) 21 mg/24hr patch Place 1 patch (21 mg total) onto the skin daily. Patient  not taking: Reported on 04/21/2018 10/25/17   [name], [name], MD     ALLERGIES:  Allergies  Allergen Reactions  . Sulfa Antibiotics Anaphylaxis  . Sulfasalazine Anaphylaxis  . Codeine Nausea Only  . Lavender Oil     Sinus swelling, nausea  . Penicillins Rash    Has patient had a PCN reaction causing immediate rash, facial/tongue/throat swelling, SOB or lightheadedness with hypotension: Yes Has patient had a PCN reaction causing severe rash involving mucus membranes or skin necrosis: No Has patient had a PCN reaction that required hospitalization: No Has patient had a PCN reaction occurring within the last 10 years: No If all of the above answers are "NO", then may proceed with Cephalosporin use.     SOCIAL HISTORY:  Social History   Socioeconomic History  . Marital status: Married    Spouse name: Not on file  . Number of children: Not on file  . Years of education: Not on file  . Highest education level: Not on file  Occupational History  . Not on file  Social Needs  . Financial resource strain: Not on file  . Food insecurity:    Worry: Not on file    Inability: Not on file  . Transportation needs:    Medical: Not on file    Non-medical: Not on file  Tobacco Use  . Smoking status: Heavy Tobacco Smoker  . Smokeless tobacco: Never Used  Substance and Sexual Activity  . Alcohol use: Never    Frequency: Never  . Drug use: Never  . Sexual activity: Not on file  Lifestyle  . Physical activity:    Days per week: Not on file    Minutes per session: Not on file  . Stress: Not on file  Relationships  . Social connections:    Talks on phone: Not on file    Gets together: Not on file    Attends religious service: Not on file    Active member of club or organization: Not on file    Attends meetings of clubs or organizations: Not on file    Relationship status: Not on file  . Intimate partner violence:    Fear of current or ex partner: Not on file    Emotionally  abused: Not on file    Physically abused: Not on file    Forced sexual activity: Not on file  Other Topics Concern  . Not on file  Social History Narrative  . Not on file     FAMILY HISTORY:  Family history reviewed and not pertinent in this case.  REVIEW OF SYSTEMS:  Constitutional: denies weight loss, fever, chills, or sweats  Eyes: denies any other vision changes, history of eye injury  ENT: denies sore throat, hearing problems  Respiratory: positive for shortness of breath, wheezing and coughing Cardiovascular: denies chest pain, palpitations  Gastrointestinal: positive abdominal pain, N/V, Positive for constipation. Negative for rectal bleeding.  Genitourinary: denies burning with urination or urinary frequency Musculoskeletal: denies any other joint pains or cramps  Skin: positive for skin lesions Neurological: denies any other headache, dizziness, weakness  Psychiatric: positive for depression, anxiety   All other review of systems were negative   VITAL SIGNS:  Temp:  [98.2 F (36.8 C)-98.8 F (37.1 C)] 98.2 F (36.8 C) (10/28 2023) Pulse Rate:  [74-87] 74 (10/28 2023) Resp:  [16-24] 18 (10/28 2023) BP: (134-153)/(81-94) 134/94 (10/28 2023) SpO2:  [87 %-97 %] 91 % (10/28 2023) Weight:  [95.7 kg] 95.7 kg (10/28 0500)       Weight: 95.7 kg     INTAKE/OUTPUT:  This shift: No intake/output data recorded.  Last 2 shifts: @IOLAST2SHIFTS @   PHYSICAL EXAM:  Constitutional:  -- Normal body habitus for obese patient -- Awake, alert, and oriented x3, anxious Eyes:  -- Pupils equally round and reactive to light  -- No scleral icterus  Ear, nose, and throat:  -- No jugular venous distension  Pulmonary:  -- Bibasilar wheezing -- Equal low breath sounds bilaterally -- hyperventilating at rest Cardiovascular:  -- S1, S2 present  -- No pericardial rubs Gastrointestinal:  -- Abdomen soft, tender on left side with large ecchymosis, non-distended, no guarding or rebound  tenderness -- No abdominal masses appreciated, pulsatile or otherwise  Musculoskeletal and Integumentary:  -- skin: forehead skin lesion -- Extremities: B/L UE and LE FROM, hands and feet warm, no edema  Neurologic:  -- Motor function: intact and symmetric -- Sensation: intact and symmetric   Labs:  CBC Latest Ref Rng & Units 04/21/2018 04/20/2018 10/19/2017  WBC 4.0 - 10.5 K/uL 11.3(H) 13.4(H) 8.2  Hemoglobin 12.0 - 15.0 g/dL 8.7(L) 9.6(L) 10.5(L)  Hematocrit 36.0 - 46.0 % 31.8(L) 33.5(L) 33.1(L)  Platelets 150 - 400 K/uL 350 398 340   CMP Latest Ref Rng & Units 04/21/2018 04/20/2018 10/23/2017  Glucose 70 - 99 mg/dL 113(H) 171(H) -  BUN 8 - 23 mg/dL 10 10 -  Creatinine 0.44 - 1.00 mg/dL 0.96 1.05(H) 0.78  Sodium 135 - 145 mmol/L 138 134(L) -  Potassium 3.5 - 5.1 mmol/L 3.9 4.4 -  Chloride 98 - 111 mmol/L 101 98 -  CO2 22 - 32 mmol/L 30 27 -  Calcium 8.9 - 10.3 mg/dL 8.6(L) 9.0 -  Total Protein 6.5 - 8.1 g/dL - 6.8 -  Total Bilirubin 0.3 - 1.2 mg/dL - 0.7 -  Alkaline Phos 38 - 126 U/L - 65 -  AST 15 - 41 U/L - 34 -  ALT 0 - 44 U/L - 17 -   Imaging studies:  EXAM: CT ABDOMEN AND PELVIS WITH CONTRAST  TECHNIQUE: Multidetector CT imaging of the abdomen and pelvis was performed using the standard protocol following bolus administration of intravenous contrast.  CONTRAST:  178mL ISOVUE-300 IOPAMIDOL (ISOVUE-300) INJECTION 61%  COMPARISON:  CT of the abdomen and pelvis on 05/29/2012  FINDINGS: Lower chest: There is coronary artery disease. Heart size is UPPER normal. There is a small LEFT pleural effusion. There is focal atelectasis within the anterior LEFT LOWER lobe. An area of ground-glass density is identified in the anterior RIGHT LOWER lobe, not further characterized and incompletely imaged. This measures at least 0.8 x 1.5 centimeters. Small nodule is identified at the RIGHT lung base, measuring 9 millimeters.  Hepatobiliary: Status post cholecystectomy.  There is dilatation of the intra and extrahepatic biliary ducts, stable since previous exam. No focal liver lesion. Prominent caudate lobe.  Pancreas: There are calcifications within the pancreas, consistent with chronic pancreatitis. Mildly prominent pancreatic duct is stable. No focal pancreatic lesion.  Spleen: The spleen is enlarged  Adrenals/Urinary Tract: The adrenal glands are normal in appearance. No hydronephrosis or renal mass. The ureters are unremarkable. Urinary bladder is distended and otherwise normal in appearance.  Stomach/Bowel: Gastric bypass surgery without evidence for obstruction or complication. There is a small bowel, unremarkable in appearance anastomosis in the RIGHT LOWER QUADRANT. A small bowel anastomosis is identified in the LEFT UPPER QUADRANT, also unremarkable.  The appendix is well seen and has a normal appearance.  There is significant distension of the ascending and transverse colon by stool. At the level of the splenic flexure, there is change in caliber from dilated to normal caliber large bowel and a focal area of bowel wall thickening, best seen on coronal image 42/5. Although stool is identified in the distal colon the distal colonic loops are not distended. There are scattered diverticula within the sigmoid colon. No acute diverticulitis.  Vascular/Lymphatic: There is atherosclerotic calcification of the abdominal aorta not associated with aneurysm. No retroperitoneal or mesenteric adenopathy.  Reproductive: Hysterectomy.  No adnexal mass.  Other: No ascites. Postoperative changes in the anterior abdominal wall.  Musculoskeletal: Degenerative disc disease at L5-S1. No suspicious lytic or blastic lesions are identified.  IMPRESSION: 1. Significant stool and distension of the ascending and transverse colon with transition zone identified in the region of the splenic flexure. There is associated focal colonic wall  thickening and irregularity raising the question of malignant lesion. Recommend further evaluation with colonoscopy. 2. There are changes of both lung bases. An incompletely imaged ground-glass opacity identified within the RIGHT LOWER lobe measuring at least 0.8 x 1.5 centimeters, warranting further evaluation. Recommend CT of the chest with intravenous contrast for further characterization of this and other pulmonary findings. 3. Gastric bypass surgery. 4. Unremarkable small bowel anastomoses. 5. Normal appendix. 6. Hysterectomy.  Cholecystectomy. 7.  Aortic atherosclerosis.  (ICD10-I70.0) 8. Sigmoid diverticulosis without acute diverticulitis.   Electronically Signed   By: Nolon Nations M.D.   On: 04/20/2018 21:30   Assessment/Plan:  62 y.o. female with abdominal pian with suspected large bowel mass as per CT scan, complicated by pertinent comorbidities including COPD with acute exacerbation, multifocal pneumonia, Hypertension, Smoker, multiple abdominal surgeries, obesity with gastric bypass, among recent varicella, Chronic hepatitis C, among others.  Patient present with the left sided abdominal pain. On workup on CT scan shows a suspected mass on the hepatic flexure. On the very difficult history taken from patient she refers that she has previous large bowel and small bowel surgeries. OR report were not found on chart. Upon evaluation of the images patient has a large burden of stool on the ascending colon but also has air and stool on the descending, sigmoid and rectum. It does not seem that she is obstructed but has a severe problem with constipation. I agree with GI service to start clear liquid diet and start slow bowel prep. It is better to give a good bowel prep even if it takes around 2 days that given her a poor bowel prep and have a poor quality colonoscopy assessment due to the bowel prep.  If a mass is confirmed, patient will need staging. She already has the  abdomen/pelvis CT scan, needs chest CT scan with IV due to suspected nodules seen on the abdomen and pelvic CT scan that may not be appreciated on the chest CT scan without contrast. She will also need CEA levels.  Also I thinks that will be beneficial for patient a psychiatry evaluation. Patient was found  severely anxious and hyperventilating with the thought that she is going to die for every news that she is receiving. This will also help to evaluate the mental capacity of the patient to make her personal medical decisions. As more information comes with the colonoscopy results and staging of the patient, I will seat down with patient had preferentially family members to discuss surgical therapy if need.  Patient will also need surgical risk stratification to see if she will tolerate the surgical procedure.  If there is no mass seen on Colonoscopy, her problem may be due to chronic stricture or severe constipation.  Due to this patient medical and mental status (anxiety), it will be very important to have all the information possible before making any decision of surgical management. Will follow closely.   Arnold Long, MD

## 2018-04-21 NOTE — Progress Notes (Addendum)
Patient very anxious and upset. Patient is crying out " I am going to die" . Patient reassured that we were going to take good care of her tonight. Lorazepam iv given as ordered. Dr. Peyton Najjar- diaz came to see patient this evening and discussed possible plan for tomorrow. Patient currently lying in bed resting comfortably.  Terrial Rhodes

## 2018-04-21 NOTE — H&P (Signed)
[facility] NAME: Christine Nichols    MR#:  [id number]  DATE OF BIRTH:  [birth year]  DATE OF ADMISSION:  04/20/2018  PRIMARY CARE PHYSICIAN: System, Pcp Not In   REQUESTING/REFERRING PHYSICIAN:   CHIEF COMPLAINT:   Chief Complaint  Patient presents with  . Abdominal Pain    HISTORY OF PRESENT ILLNESS: Christine Nichols  is a 62 y.o. female with a known history of tobacco abuse, COPD, diabetes type 2, lupus, neuropathy, arthritis and other comorbidities.  Patient has had 13 abdominal surgeries in the past. Patient presented to emergency room for generalized abdominal discomfort and severe pain in the left upper quadrant, worsened by coughing spells.  Constipation is associated.  Her abdominal pain and constipation have been going on for the past 5 days, gradually getting worse.  For the past 24 hours, patient has been nauseated and had 2 episodes of nonbloody vomiting at home. Patient has chronic cough, wheezing and shortness of breath, due to her severe COPD, but she thinks she has been worsening in the past 2 to 3 days.  No fever or chills. Blood test done emergency room are notable for creatinine 1.05, hemoglobin 9.6, WBC 13.4, glucose 171. CT of the abdomen shows significant stool and distension of the ascending and transverse colon with transition zone identified in the region of the splenic flexure. There is associated focal colonic wall thickening and irregularity raising the question of malignant lesion.  CT of the chest shows Patchy area of ground-glass opacity in the right upper lobe mostconsistent with pneumonia. Additional smaller ground-glass nodular densities bilaterally likely represent developing infiltrate and multifocal pneumonia. There is also a small left pleural effusion with associated left lung base atelectasis or infiltrate. Patient is admitted for further evaluation and treatment.  PAST MEDICAL HISTORY:   Past  Medical History:  Diagnosis Date  . Arthritis   . Bullous pemphigoid   . Diabetes mellitus without complication ([name])   . Hypertension   . Lupus ([name])   . Neuropathy     PAST SURGICAL HISTORY:  Past Surgical History:  Procedure Laterality Date  . ABDOMINAL HYSTERECTOMY    . ABDOMINAL SURGERY      SOCIAL HISTORY:  Social History   Tobacco Use  . Smoking status: Heavy Tobacco Smoker  . Smokeless tobacco: Never Used  Substance Use Topics  . Alcohol use: Never    Frequency: Never    FAMILY HISTORY: Hypertension in both parents.  DRUG ALLERGIES:  Allergies  Allergen Reactions  . Sulfa Antibiotics Anaphylaxis  . Codeine Nausea Only  . Penicillins Rash    Has patient had a PCN reaction causing immediate rash, facial/tongue/throat swelling, SOB or lightheadedness with hypotension: Yes Has patient had a PCN reaction causing severe rash involving mucus membranes or skin necrosis: No Has patient had a PCN reaction that required hospitalization: No Has patient had a PCN reaction occurring within the last 10 years: No If all of the above answers are "NO", then may proceed with Cephalosporin use.    REVIEW OF SYSTEMS:   CONSTITUTIONAL: No fever, positive for fatigue general weakness.  EYES: No changes in vision.  EARS, NOSE, AND THROAT: No tinnitus or ear pain.  RESPIRATORY: Positive for productive cough, shortness of breath, wheezing, no hemoptysis.  CARDIOVASCULAR: No chest pain, orthopnea, edema.  GASTROINTESTINAL: Positive for nausea, vomiting, constipation and abdominal pain.  GENITOURINARY: No dysuria, hematuria.  ENDOCRINE: No polyuria, nocturia. HEMATOLOGY: No bleeding. SKIN: No rash  or lesion. MUSCULOSKELETAL: No joint pain at this time.   NEUROLOGIC: No focal weakness.  PSYCHIATRY: Positive history of anxiety disorder.   MEDICATIONS AT HOME:  Prior to Admission medications   Medication Sig Start Date End Date Taking? Authorizing Provider  albuterol (PROVENTIL  HFA;VENTOLIN HFA) 108 (90 Base) MCG/ACT inhaler Inhale 2 puffs into the lungs every 6 (six) hours as needed for wheezing or shortness of breath.    [provider]  citalopram (CELEXA) 40 MG tablet Take 1 tablet by mouth daily. 10/10/17   [provider]  clobetasol cream (TEMOVATE) 0.05 % Apply topically 2 (two) times daily. 10/24/17   [name], [name], MD  docusate sodium (COLACE) 100 MG capsule Take 1 tablet once or twice daily as needed for constipation while taking narcotic pain medicine Patient not taking: Reported on 10/16/2017 08/09/17   [name], MD  famotidine (PEPCID) 20 MG tablet Take 1 tablet (20 mg total) by mouth daily. 10/25/17   [name], [name], MD  gabapentin (NEURONTIN) 100 MG capsule Take 1 capsule (100 mg total) by mouth 3 (three) times daily. 10/24/17 10/24/18  [name], [name], MD  HYDROcodone-acetaminophen (NORCO/VICODIN) 5-325 MG tablet Take 1-2 tablets by mouth every 6 (six) hours as needed for moderate pain or severe pain. Patient not taking: Reported on 10/16/2017 08/09/17   [name], MD  hydrOXYzine (ATARAX/VISTARIL) 25 MG tablet Take 1 tablet (25 mg total) by mouth every 8 (eight) hours as needed for anxiety or itching. 10/24/17   [name], [name], MD  levothyroxine (SYNTHROID, LEVOTHROID) 75 MCG tablet Take 75 mcg by mouth daily before breakfast.    [provider]  nicotine (NICODERM CQ - DOSED IN MG/24 HOURS) 21 mg/24hr patch Place 1 patch (21 mg total) onto the skin daily. 10/25/17   [name], [name], MD  predniSONE (DELTASONE) 20 MG tablet Take 3 tablets (60 mg total) by mouth daily with breakfast. 10/25/17   [name], [name], MD      PHYSICAL EXAMINATION:   VITAL SIGNS: Blood pressure (!) 148/81, pulse 85, temperature 98.8 F (37.1 C), temperature source Oral, resp. rate 18, SpO2 91 %.  GENERAL:  62 y.o.-year-old patient lying in the bed with moderate distress, secondary to pain and cough.  She looks uncomfortable and anxious. EYES:  Pupils equal, round, reactive to light and accommodation. No scleral icterus. Extraocular muscles intact.  HEENT: Head atraumatic, normocephalic. Oropharynx and nasopharynx clear.  NECK:  Supple, no jugular venous distention. No thyroid enlargement, no tenderness.  LUNGS: Reduced breath sounds and wheezing noted bilaterally.  Mild use of accessory muscles of respiration noted.  CARDIOVASCULAR: S1, S2 normal. No S3/S4.  ABDOMEN: There is tenderness with palpation in the left upper quadrant, no guarding/rebound.  Otherwise, abdomen is obese, soft, nondistended. Bowel sounds present. EXTREMITIES: No pedal edema, cyanosis, or clubbing.  NEUROLOGIC: No focal weakness. PSYCHIATRIC: The patient is alert and oriented x 3.  SKIN: No obvious rash, lesion, or ulcer.   LABORATORY PANEL:   CBC Recent Labs  Lab 04/20/18 1601  WBC 13.4*  HGB 9.6*  HCT 33.5*  PLT 398  MCV 74.8*  MCH 21.4*  MCHC 28.7*  RDW 21.2*   ------------------------------------------------------------------------------------------------------------------  Chemistries  Recent Labs  Lab 04/20/18 1601  NA 134*  K 4.4  CL 98  CO2 27  GLUCOSE 171*  BUN 10  CREATININE 1.05*  CALCIUM 9.0  AST 34  ALT 17  ALKPHOS 65  BILITOT 0.7   ------------------------------------------------------------------------------------------------------------------ CrCl cannot be calculated (  Unknown ideal weight.). ------------------------------------------------------------------------------------------------------------------ No results for input(s): TSH, T4TOTAL, T3FREE, THYROIDAB in the last 72 hours.  Invalid input(s): FREET3   Coagulation profile No results for input(s): INR, PROTIME in the last 168 hours. ------------------------------------------------------------------------------------------------------------------- No results for input(s): DDIMER in the last 72  hours. -------------------------------------------------------------------------------------------------------------------  Cardiac Enzymes No results for input(s): CKMB, TROPONINI, MYOGLOBIN in the last 168 hours.  Invalid input(s): CK ------------------------------------------------------------------------------------------------------------------ Invalid input(s): POCBNP  ---------------------------------------------------------------------------------------------------------------  Urinalysis    Component Value Date/Time   COLORURINE YELLOW (A) 04/20/2018 1601   APPEARANCEUR CLEAR (A) 04/20/2018 1601   APPEARANCEUR Cloudy 05/29/2012 1408   LABSPEC 1.005 04/20/2018 1601   LABSPEC 1.016 05/29/2012 1408   PHURINE 7.0 04/20/2018 1601   GLUCOSEU 150 (A) 04/20/2018 1601   GLUCOSEU Negative 05/29/2012 1408   HGBUR SMALL (A) 04/20/2018 1601   BILIRUBINUR NEGATIVE 04/20/2018 1601   BILIRUBINUR Negative 05/29/2012 1408   KETONESUR NEGATIVE 04/20/2018 1601   PROTEINUR NEGATIVE 04/20/2018 1601   NITRITE NEGATIVE 04/20/2018 1601   LEUKOCYTESUR NEGATIVE 04/20/2018 1601   LEUKOCYTESUR 3+ 05/29/2012 1408     RADIOLOGY: Ct Chest Wo Contrast  Result Date: 04/20/2018 CLINICAL DATA:  62 year old female with shortness of breath. EXAM: CT CHEST WITHOUT CONTRAST TECHNIQUE: Multidetector CT imaging of the chest was performed following the standard protocol without IV contrast. COMPARISON:  Chest radiograph dated 04/20/2018 FINDINGS: Evaluation of this exam is limited in the absence of intravenous contrast. Cardiovascular: There is no cardiomegaly or pericardial effusion. Multi vessel coronary vascular calcification. There is mild atherosclerotic calcification of the thoracic aorta. Slight prominence of the central pulmonary arteries may represent pulmonary hypertension. Clinical correlation is recommended. Mediastinum/Nodes: No hilar or mediastinal adenopathy. Evaluation however is limited in the  absence of intravenous contrast. The esophagus is grossly unremarkable. No mediastinal fluid collection. Lungs/Pleura: There is a patchy area of ground-glass opacity in the right upper lobe most consistent with pneumonia. Clinical correlation and follow-up to resolution recommended. Additional smaller ground-glass nodular densities primarily in the right upper and right middle lobes likely represent developing infiltrate. An area of consolidative change in the lingula may represent atelectasis versus infiltrate. Additional scattered ground-glass nodular densities in the left upper lobe (series 3, image 51 and 73). There is a small left pleural effusion and associated left lung base atelectasis or infiltrate. No pneumothorax. The central airways are patent. Upper Abdomen: Postsurgical changes of gastric bypass. Left renal upper pole nonobstructing calculi. Musculoskeletal: Degenerative changes of the spine. No acute osseous pathology. IMPRESSION: 1. Patchy area of ground-glass opacity in the right upper lobe most consistent with pneumonia. Additional smaller ground-glass nodular densities bilaterally likely represent developing infiltrate and multifocal pneumonia. Clinical correlation and follow-up to resolution recommended. 2. Small left pleural effusion with associated left lung base atelectasis or infiltrate. 3. Coronary vascular calcification and . Electronically Signed   By: Anner Crete M.D.   On: 04/20/2018 23:05   Ct Abdomen Pelvis W Contrast  Result Date: 04/20/2018 CLINICAL DATA:  LEFT UPPER QUADRANT abdominal pain for 3 days. Nausea, vomiting. Question impacted. History of cholecystectomy, colon surgery, small intestine surgery. EXAM: CT ABDOMEN AND PELVIS WITH CONTRAST TECHNIQUE: Multidetector CT imaging of the abdomen and pelvis was performed using the standard protocol following bolus administration of intravenous contrast. CONTRAST:  122mL ISOVUE-300 IOPAMIDOL (ISOVUE-300) INJECTION 61%  COMPARISON:  CT of the abdomen and pelvis on 05/29/2012 FINDINGS: Lower chest: There is coronary artery disease. Heart size is UPPER normal. There is a small LEFT pleural effusion. There is focal atelectasis within the  anterior LEFT LOWER lobe. An area of ground-glass density is identified in the anterior RIGHT LOWER lobe, not further characterized and incompletely imaged. This measures at least 0.8 x 1.5 centimeters. Small nodule is identified at the RIGHT lung base, measuring 9 millimeters. Hepatobiliary: Status post cholecystectomy. There is dilatation of the intra and extrahepatic biliary ducts, stable since previous exam. No focal liver lesion. Prominent caudate lobe. Pancreas: There are calcifications within the pancreas, consistent with chronic pancreatitis. Mildly prominent pancreatic duct is stable. No focal pancreatic lesion. Spleen: The spleen is enlarged Adrenals/Urinary Tract: The adrenal glands are normal in appearance. No hydronephrosis or renal mass. The ureters are unremarkable. Urinary bladder is distended and otherwise normal in appearance. Stomach/Bowel: Gastric bypass surgery without evidence for obstruction or complication. There is a small bowel, unremarkable in appearance anastomosis in the RIGHT LOWER QUADRANT. A small bowel anastomosis is identified in the LEFT UPPER QUADRANT, also unremarkable. The appendix is well seen and has a normal appearance. There is significant distension of the ascending and transverse colon by stool. At the level of the splenic flexure, there is change in caliber from dilated to normal caliber large bowel and a focal area of bowel wall thickening, best seen on coronal image 42/5. Although stool is identified in the distal colon the distal colonic loops are not distended. There are scattered diverticula within the sigmoid colon. No acute diverticulitis. Vascular/Lymphatic: There is atherosclerotic calcification of the abdominal aorta not associated with aneurysm.  No retroperitoneal or mesenteric adenopathy. Reproductive: Hysterectomy.  No adnexal mass. Other: No ascites. Postoperative changes in the anterior abdominal wall. Musculoskeletal: Degenerative disc disease at L5-S1. No suspicious lytic or blastic lesions are identified. IMPRESSION: 1. Significant stool and distension of the ascending and transverse colon with transition zone identified in the region of the splenic flexure. There is associated focal colonic wall thickening and irregularity raising the question of malignant lesion. Recommend further evaluation with colonoscopy. 2. There are changes of both lung bases. An incompletely imaged ground-glass opacity identified within the RIGHT LOWER lobe measuring at least 0.8 x 1.5 centimeters, warranting further evaluation. Recommend CT of the chest with intravenous contrast for further characterization of this and other pulmonary findings. 3. Gastric bypass surgery. 4. Unremarkable small bowel anastomoses. 5. Normal appendix. 6. Hysterectomy.  Cholecystectomy. 7.  Aortic atherosclerosis.  (ICD10-I70.0) 8. Sigmoid diverticulosis without acute diverticulitis. Electronically Signed   By: Nolon Nations M.D.   On: 04/20/2018 21:30   Dg Abdomen Acute W/chest  Result Date: 04/20/2018 CLINICAL DATA:  Patient reports left upper quadrant abdominal pain for past 3 days. Patient reports pain resolves after she sits still for a few minutes, but returns as soon as she attempts to move. Patient reports hx of 13 separate abdominal sx's. Hx hysterectomy, DM, lupus. Current smoker EXAM: DG ABDOMEN ACUTE W/ 1V CHEST COMPARISON:  Chest radiographs, 08/08/2017. Abdomen and pelvis CT, 05/29/2012. FINDINGS: There is no bowel dilation to suggest obstruction. No air-fluid levels. No evidence of a significant adynamic ileus. No free air. Numerous surgical vascular clips are noted in the left upper quadrant. Bowel anastomosis staples noted in the left mid abdomen. Mild to moderate  increased colonic stool burden. Cardiac silhouette is normal in size. There are prominent bronchovascular markings bilaterally. No lung consolidation. Small left pleural effusion. IMPRESSION: 1. No acute findings the abdomen or pelvis. No bowel obstruction or free air. 2. Mild to moderate increase colonic stool burden. 3. Small left pleural effusion.  No acute cardiopulmonary disease. Electronically Signed  By: Lajean Manes M.D.   On: 04/20/2018 18:15    EKG: Orders placed or performed during the hospital encounter of 04/20/18  . ED EKG  . ED EKG  . EKG 12-Lead  . EKG 12-Lead    IMPRESSION AND PLAN:   1.  Multifocal community-acquired pneumonia. We will treat with IV steroids, nebulizer, oxygen and antibiotics. 2.  Acute COPD exacerbation.  We will treat with IV steroids, nebulizer, oxygen and antibiotics. 3.  Large bowel obstruction.  We will keep patient n.p.o.. Continue supportive measures with antinausea and pain medications.  Gastroenterology and general surgery are consulted for further evaluation and treatment. 4.  Hypertension, stable we will use IV medication as needed to correct BP, while patient is n.p.o.  5.  Tobacco abuse.  Smoking cessation was discussed with patient in detail.   All the records are reviewed and case discussed with ED provider. Management plans discussed with the patient and she is in agreement.  CODE STATUS: Full Code Status History    Date Active Date Inactive Code Status Order ID Comments User Context   10/16/2017 1848 10/24/2017 1725 Full Code [id number]  Demetrios Loll, MD Inpatient       TOTAL TIME TAKING CARE OF THIS PATIENT: 55 minutes.    Amelia Jo M.D on 04/21/2018 at 12:28 AM  Between 7am to 6pm - Pager - [phone]  After 6pm go to [email or web address] - password [email or web address] [facility] [place] [facility]  [phone]  CC: Primary care physician; System, Pcp Not In

## 2018-04-21 NOTE — Consult Note (Signed)
Cephas Darby, MD [address]  [place]  [place], [place] [address]  Main: [phone]  Fax: [phone] Pager: [phone]   Consultation  Referring Provider:     No ref. provider found Primary Care Physician:  System, Pcp Not In Primary Gastroenterologist:   Dr. Lazaro Arms, [facility] hepatology       Reason for Consultation:     Possible colon mass/obstruction  Date of Admission:  04/20/2018 Date of Consultation:  04/21/2018         HPI:   Christine Nichols is a 62 y.o. Caucasian female with history of chronic hep C, status post treatment and confirmed eradication, history of Roux-en-Y gastric bypass, multiple abdominal surgeries, bullous pemphigoid, A. Fib who is admitted with 3 days of left upper quadrant abdominal pain, progressively worsening associated with nausea and vomiting, constipation.  Her last bowel movement was 1 week ago. In the ER, she was found to have mild leukocytosis, normal LFTs, lipase normal.  Underwent CT A/P with contrast which revealed dilated ascending and transverse colon with significant stool burden and clear transition zone in the region of splenic flexure.  With focal colon wall thickening and irregularity raising the question of malignant lesion.  Therefore, GI is consulted to evaluate for colonoscopy.  General surgery is also consulted to evaluate for bowel obstruction.  Patient reports that she has been passing gas.  At baseline, she reports that her stools are formed.  She denies rectal bleeding. Patient reports having had a colonoscopy more than 10 years ago.  Of note, patient also has history of severe iron deficiency anemia secondary to gastric bypass Her baseline hemoglobin fluctuates between 9.9 and 11.6.  On admission, her hemoglobin was 9.6, dropped to 8.7 today.  MCV [place] 0.4.  It was 10.5 in 09/2017.  Ferritin 28  When I interviewed the patient, she was tearful, worried about losing her job as her husband was upset that she is  sick and he does not have a job, dependent on her.  She is also tearful about her diagnosis of bullous pemphigoid, which resulted in prednisone use and weight gain since her gastric bypass.  She is asking if she could drink some liquids.  NSAIDs: None  Antiplts/Anticoagulants/Anti thrombotics: None  GI Procedures: EGD at [facility] 11/27/2017                                Impression:    - Esophageal plaques were found, consistent with            candidiasis.           - Roux-en-Y gastrojejunostomy with gastrojejunal            anastomosis characterized by healthy appearing mucosa.           - Hiatal hernia.           - Normal examined jejunum.           - No specimens collected.  Past Medical History:  Diagnosis Date  . Arthritis   . Bullous pemphigoid   . Diabetes mellitus without complication ([place])   . Hypertension   . Lupus ([place])   . Neuropathy     Past Surgical History:  Procedure Laterality Date  . ABDOMINAL HYSTERECTOMY    . ABDOMINAL SURGERY      Prior to Admission medications   Medication Sig Start Date End Date Taking? Authorizing Provider  acetaminophen (TYLENOL) 500 MG tablet Take 500 mg by mouth every 6 (six) hours as needed for mild pain or moderate pain.   Yes [provider]  albuterol (PROVENTIL HFA;VENTOLIN HFA) 108 (90 Base) MCG/ACT inhaler Inhale 2 puffs into the lungs every 4 (four) hours as needed for wheezing or shortness of breath.    Yes [provider]  amLODipine (NORVASC) 5 MG tablet Take 10 mg by mouth daily.   Yes [provider]  cetirizine (ZYRTEC) 10 MG tablet Take 10 mg by mouth daily.   Yes [provider]  citalopram (CELEXA) 40 MG tablet Take 1 tablet by mouth daily. 10/10/17  Yes [provider]  clobetasol cream (TEMOVATE) 0.05 % Apply topically 2 (two) times daily. 10/24/17  Yes [name], [name], MD  gabapentin  (NEURONTIN) 400 MG capsule Take 800 mg by mouth 3 (three) times daily.   Yes [provider]  hydrOXYzine (ATARAX/VISTARIL) 25 MG tablet Take 1 tablet (25 mg total) by mouth every 8 (eight) hours as needed for anxiety or itching. 10/24/17  Yes [name], [name], MD  levothyroxine (SYNTHROID, LEVOTHROID) 75 MCG tablet Take 75 mcg by mouth daily before breakfast.   Yes [provider]  omeprazole (PRILOSEC) 20 MG capsule Take 20 mg by mouth daily.   Yes [provider]  predniSONE (DELTASONE) 20 MG tablet Take 3 tablets (60 mg total) by mouth daily with breakfast. 10/25/17  Yes [name], Montell D, MD  triamcinolone ointment (KENALOG) 0.1 % Apply 1 application topically 2 (two) times daily.   Yes [provider]  docusate sodium (COLACE) 100 MG capsule Take 1 tablet once or twice daily as needed for constipation while taking narcotic pain medicine Patient not taking: Reported on 10/16/2017 08/09/17   [name], MD  famotidine (PEPCID) 20 MG tablet Take 1 tablet (20 mg total) by mouth daily. Patient not taking: Reported on 04/21/2018 10/25/17   [name], Holly Bodily D, MD  gabapentin (NEURONTIN) 100 MG capsule Take 1 capsule (100 mg total) by mouth 3 (three) times daily. Patient not taking: Reported on 04/21/2018 10/24/17 10/24/18  [name], [name], MD  HYDROcodone-acetaminophen (NORCO/VICODIN) 5-325 MG tablet Take 1-2 tablets by mouth every 6 (six) hours as needed for moderate pain or severe pain. Patient not taking: Reported on 10/16/2017 08/09/17   [name], MD  nicotine (NICODERM CQ - DOSED IN MG/24 HOURS) 21 mg/24hr patch Place 1 patch (21 mg total) onto the skin daily. Patient not taking: Reported on 04/21/2018 10/25/17   [name], [name], MD    No family history on file.   Social History   Tobacco Use  . Smoking status: Heavy Tobacco Smoker  . Smokeless tobacco: Never Used  Substance Use Topics  . Alcohol use: Never    Frequency: Never  . Drug use: Never     Allergies as of 04/20/2018 - Review Complete 04/20/2018  Allergen Reaction Noted  . Sulfa antibiotics Anaphylaxis 08/08/2017  . Codeine Nausea Only 08/08/2017  . Penicillins Rash 08/08/2017    Review of Systems:    All systems reviewed and negative except where noted in HPI.   Physical Exam:  Vital signs in last 24 hours: Temp:  [98.4 F (36.9 C)-98.8 F (37.1 C)] 98.4 F (36.9 C) (10/28 1304) Pulse Rate:  [79-89] 82 (10/28 1304) Resp:  [16-24] 16 (10/28 1304) BP: (140-153)/(81-93) 145/90 (10/28 1304) SpO2:  [87 %-97 %] 90 % (10/28 1304) Weight:  [95.7 kg] 95.7 kg (10/28 0500)   General:  Tearful, sad, not in distress Head:  Normocephalic and atraumatic. Eyes:   No icterus.   Conjunctiva pink. PERRLA. Ears:  Normal auditory acuity. Neck:  Supple; no masses or thyroidomegaly Lungs: Respirations even and unlabored. Lungs clear to auscultation bilaterally.   No wheezes, crackles, or rhonchi.  Heart:  Regular rate and rhythm;  Without murmur, clicks, rubs or gallops Abdomen:  Soft, grossly distended, moderate left upper quadrant tenderness.  Hypoactive bowel sounds. No appreciable masses or hepatomegaly.  No rebound or guarding.  Rectal:  Not performed. Msk:  Symmetrical without gross deformities.  Strength normal Extremities:  Without edema, cyanosis or clubbing. Neurologic:  Alert and oriented x3;  grossly normal neurologically. Skin:  Intact without significant lesions or rashes.  Healed lesions and scar from bullous pemphigoid in bilateral upper and lower extremities Cervical Nodes:  No significant cervical adenopathy. Psych:  Alert and cooperative.  Tearful  LAB RESULTS: CBC Latest Ref Rng & Units 04/21/2018 04/20/2018 10/19/2017  WBC 4.0 - 10.5 K/uL 11.3(H) 13.4(H) 8.2  Hemoglobin 12.0 - 15.0 g/dL 8.7(L) 9.6(L) 10.5(L)  Hematocrit 36.0 - 46.0 % 31.8(L) 33.5(L) 33.1(L)  Platelets 150 - 400 K/uL 350 398 340    BMET BMP Latest Ref Rng & Units 04/21/2018 04/20/2018  10/23/2017  Glucose 70 - 99 mg/dL 113(H) 171(H) -  BUN 8 - 23 mg/dL 10 10 -  Creatinine 0.44 - 1.00 mg/dL 0.96 1.05(H) 0.78  Sodium 135 - 145 mmol/L 138 134(L) -  Potassium 3.5 - 5.1 mmol/L 3.9 4.4 -  Chloride 98 - 111 mmol/L 101 98 -  CO2 22 - 32 mmol/L 30 27 -  Calcium 8.9 - 10.3 mg/dL 8.6(L) 9.0 -    LFT Hepatic Function Latest Ref Rng & Units 04/20/2018 10/19/2017 10/18/2017  Total Protein 6.5 - 8.1 g/dL 6.8 6.3(L) 6.4(L)  Albumin 3.5 - 5.0 g/dL 3.7 2.7(L) 2.8(L)  AST 15 - 41 U/L 34 38 51(H)  ALT 0 - 44 U/L 17 32 41  Alk Phosphatase 38 - 126 U/L 65 164(H) 197(H)  Total Bilirubin 0.3 - 1.2 mg/dL 0.7 0.3 0.2(L)  Bilirubin, Direct 0.1 - 0.5 mg/dL - - <0.1(L)     STUDIES: Ct Chest Wo Contrast  Result Date: 04/20/2018 CLINICAL DATA:  62 year old female with shortness of breath. EXAM: CT CHEST WITHOUT CONTRAST TECHNIQUE: Multidetector CT imaging of the chest was performed following the standard protocol without IV contrast. COMPARISON:  Chest radiograph dated 04/20/2018 FINDINGS: Evaluation of this exam is limited in the absence of intravenous contrast. Cardiovascular: There is no cardiomegaly or pericardial effusion. Multi vessel coronary vascular calcification. There is mild atherosclerotic calcification of the thoracic aorta. Slight prominence of the central pulmonary arteries may represent pulmonary hypertension. Clinical correlation is recommended. Mediastinum/Nodes: No hilar or mediastinal adenopathy. Evaluation however is limited in the absence of intravenous contrast. The esophagus is grossly unremarkable. No mediastinal fluid collection. Lungs/Pleura: There is a patchy area of ground-glass opacity in the right upper lobe most consistent with pneumonia. Clinical correlation and follow-up to resolution recommended. Additional smaller ground-glass nodular densities primarily in the right upper and right middle lobes likely represent developing infiltrate. An area of consolidative change in  the lingula may represent atelectasis versus infiltrate. Additional scattered ground-glass nodular densities in the left upper lobe (series 3, image 51 and 73). There is a small left pleural effusion and associated left lung base atelectasis or infiltrate. No pneumothorax. The central airways are patent. Upper Abdomen: Postsurgical changes of gastric bypass. Left renal upper pole nonobstructing  calculi. Musculoskeletal: Degenerative changes of the spine. No acute osseous pathology. IMPRESSION: 1. Patchy area of ground-glass opacity in the right upper lobe most consistent with pneumonia. Additional smaller ground-glass nodular densities bilaterally likely represent developing infiltrate and multifocal pneumonia. Clinical correlation and follow-up to resolution recommended. 2. Small left pleural effusion with associated left lung base atelectasis or infiltrate. 3. Coronary vascular calcification and . Electronically Signed   By: Anner Crete M.D.   On: 04/20/2018 23:05   Ct Abdomen Pelvis W Contrast  Result Date: 04/20/2018 CLINICAL DATA:  LEFT UPPER QUADRANT abdominal pain for 3 days. Nausea, vomiting. Question impacted. History of cholecystectomy, colon surgery, small intestine surgery. EXAM: CT ABDOMEN AND PELVIS WITH CONTRAST TECHNIQUE: Multidetector CT imaging of the abdomen and pelvis was performed using the standard protocol following bolus administration of intravenous contrast. CONTRAST:  161m ISOVUE-300 IOPAMIDOL (ISOVUE-300) INJECTION 61% COMPARISON:  CT of the abdomen and pelvis on 05/29/2012 FINDINGS: Lower chest: There is coronary artery disease. Heart size is UPPER normal. There is a small LEFT pleural effusion. There is focal atelectasis within the anterior LEFT LOWER lobe. An area of ground-glass density is identified in the anterior RIGHT LOWER lobe, not further characterized and incompletely imaged. This measures at least 0.8 x 1.5 centimeters. Small nodule is identified at the RIGHT lung  base, measuring 9 millimeters. Hepatobiliary: Status post cholecystectomy. There is dilatation of the intra and extrahepatic biliary ducts, stable since previous exam. No focal liver lesion. Prominent caudate lobe. Pancreas: There are calcifications within the pancreas, consistent with chronic pancreatitis. Mildly prominent pancreatic duct is stable. No focal pancreatic lesion. Spleen: The spleen is enlarged Adrenals/Urinary Tract: The adrenal glands are normal in appearance. No hydronephrosis or renal mass. The ureters are unremarkable. Urinary bladder is distended and otherwise normal in appearance. Stomach/Bowel: Gastric bypass surgery without evidence for obstruction or complication. There is a small bowel, unremarkable in appearance anastomosis in the RIGHT LOWER QUADRANT. A small bowel anastomosis is identified in the LEFT UPPER QUADRANT, also unremarkable. The appendix is well seen and has a normal appearance. There is significant distension of the ascending and transverse colon by stool. At the level of the splenic flexure, there is change in caliber from dilated to normal caliber large bowel and a focal area of bowel wall thickening, best seen on coronal image 42/5. Although stool is identified in the distal colon the distal colonic loops are not distended. There are scattered diverticula within the sigmoid colon. No acute diverticulitis. Vascular/Lymphatic: There is atherosclerotic calcification of the abdominal aorta not associated with aneurysm. No retroperitoneal or mesenteric adenopathy. Reproductive: Hysterectomy.  No adnexal mass. Other: No ascites. Postoperative changes in the anterior abdominal wall. Musculoskeletal: Degenerative disc disease at L5-S1. No suspicious lytic or blastic lesions are identified. IMPRESSION: 1. Significant stool and distension of the ascending and transverse colon with transition zone identified in the region of the splenic flexure. There is associated focal colonic wall  thickening and irregularity raising the question of malignant lesion. Recommend further evaluation with colonoscopy. 2. There are changes of both lung bases. An incompletely imaged ground-glass opacity identified within the RIGHT LOWER lobe measuring at least 0.8 x 1.5 centimeters, warranting further evaluation. Recommend CT of the chest with intravenous contrast for further characterization of this and other pulmonary findings. 3. Gastric bypass surgery. 4. Unremarkable small bowel anastomoses. 5. Normal appendix. 6. Hysterectomy.  Cholecystectomy. 7.  Aortic atherosclerosis.  (ICD10-I70.0) 8. Sigmoid diverticulosis without acute diverticulitis. Electronically Signed   By: ENolon Nations  M.D.   On: 04/20/2018 21:30   Dg Abdomen Acute W/chest  Result Date: 04/20/2018 CLINICAL DATA:  Patient reports left upper quadrant abdominal pain for past 3 days. Patient reports pain resolves after she sits still for a few minutes, but returns as soon as she attempts to move. Patient reports hx of 13 separate abdominal sx's. Hx hysterectomy, DM, lupus. Current smoker EXAM: DG ABDOMEN ACUTE W/ 1V CHEST COMPARISON:  Chest radiographs, 08/08/2017. Abdomen and pelvis CT, 05/29/2012. FINDINGS: There is no bowel dilation to suggest obstruction. No air-fluid levels. No evidence of a significant adynamic ileus. No free air. Numerous surgical vascular clips are noted in the left upper quadrant. Bowel anastomosis staples noted in the left mid abdomen. Mild to moderate increased colonic stool burden. Cardiac silhouette is normal in size. There are prominent bronchovascular markings bilaterally. No lung consolidation. Small left pleural effusion. IMPRESSION: 1. No acute findings the abdomen or pelvis. No bowel obstruction or free air. 2. Mild to moderate increase colonic stool burden. 3. Small left pleural effusion.  No acute cardiopulmonary disease. Electronically Signed   By: Lajean Manes M.D.   On: 04/20/2018 18:15       Impression / Plan:   Christine Nichols is a 62 y.o. female with history of Roux-en-Y gastric bypass, chronic hep C, status post treatment with confirmed eradication, no cirrhosis, bullous pemphigoid admitted with 1 week history of worsening left upper quadrant abdominal pain and distention, constipation, CT revealed probable colonic obstruction at the splenic flexure secondary to underlying mass.  Colonic obstruction/mass -Okay with clear liquid diet -Start slow bowel regimen as patient is passing gas -Will wait for surgery input before proceeding with colonoscopy  Chronic iron deficiency anemia from Roux-en-Y gastric bypass Parenteral iron Check B12, zinc, folate  Thank you for involving me in the care of this patient.  Will follow along with you    LOS: 1 day   Sherri Sear, MD  04/21/2018, 7:59 PM   Note: This dictation was prepared with Dragon dictation along with smaller phrase technology. Any transcriptional errors that result from this process are unintentional.

## 2018-04-21 NOTE — Progress Notes (Addendum)
[name]    MR#:  [id number]  DATE OF BIRTH:  Dec 22, 1955  SUBJECTIVE:  States she is having severe left flank pain, that is worse every time she coughs.  States she is tired of coughing.  She endorses continued shortness of breath and wheezing.  No chest pain.  Continues having left-sided abdominal pain.  REVIEW OF SYSTEMS:  Review of Systems  Constitutional: Negative for chills and fever.  HENT: Negative for congestion and sore throat.   Eyes: Negative for blurred vision and double vision.  Respiratory: Positive for cough, sputum production, shortness of breath and wheezing.   Cardiovascular: Negative for chest pain, palpitations and leg swelling.  Gastrointestinal: Positive for abdominal pain. Negative for nausea and vomiting.  Genitourinary: Negative for dysuria and frequency.  Musculoskeletal: Positive for back pain. Negative for neck pain.  Neurological: Negative for dizziness and headaches.  Psychiatric/Behavioral: Negative for depression. The patient is nervous/anxious.     DRUG ALLERGIES:   Allergies  Allergen Reactions  . Sulfa Antibiotics Anaphylaxis  . Sulfasalazine Anaphylaxis  . Codeine Nausea Only  . Lavender Oil     Sinus swelling, nausea  . Penicillins Rash    Has patient had a PCN reaction causing immediate rash, facial/tongue/throat swelling, SOB or lightheadedness with hypotension: Yes Has patient had a PCN reaction causing severe rash involving mucus membranes or skin necrosis: No Has patient had a PCN reaction that required hospitalization: No Has patient had a PCN reaction occurring within the last 10 years: No If all of the above answers are "NO", then may proceed with Cephalosporin use.   VITALS:  Blood pressure (!) 145/90, pulse 82, temperature 98.4 F (36.9 C), temperature source Oral, resp. rate 16, weight 95.7 kg, SpO2 90 %. PHYSICAL EXAMINATION:  Physical Exam    GENERAL:  62 year-old patient lying in the bed, coughing intermittently.  Appears uncomfortable secondary to pain. EYES: Pupils equal, round, reactive to light and accommodation. No scleral icterus. Extraocular muscles intact.  HEENT: Head atraumatic, normocephalic. Oropharynx and nasopharynx clear.  Moist mucous membranes NECK:  Supple, no jugular venous distention. No thyroid enlargement, no tenderness.  LUNGS:  Diffuse inspiratory and expiratory wheezing noted throughout all lung fields.  Mild use of accessory muscles of respiration noted.  Nasal cannula in place CARDIOVASCULAR: RRR, S1, S2 normal. No S3/S4.  ABDOMEN: There is tenderness with palpation in the left upper quadrant, no guarding/rebound.  Otherwise, abdomen is obese, soft, nondistended. Bowel sounds present. EXTREMITIES: No pedal edema, cyanosis, or clubbing.  NEUROLOGIC: No focal weakness. + Global weakness.  Sensation intact to light touch throughout. PSYCHIATRIC: The patient is alert and oriented x 3.  SKIN: No obvious rash, lesion, or ulcer.  LABORATORY PANEL:  Female CBC Recent Labs  Lab 04/21/18 0541  WBC 11.3*  HGB 8.7*  HCT 31.8*  PLT 350   ------------------------------------------------------------------------------------------------------------------ Chemistries  Recent Labs  Lab 04/20/18 1601 04/21/18 0541  NA 134* 138  K 4.4 3.9  CL 98 101  CO2 27 30  GLUCOSE 171* 113*  BUN 10 10  CREATININE 1.05* 0.96  CALCIUM 9.0 8.6*  AST 34  --   ALT 17  --   ALKPHOS 65  --   BILITOT 0.7  --    RADIOLOGY:  Ct Chest Wo Contrast  Result Date: 04/20/2018 CLINICAL DATA:  62 year old female with shortness of breath. EXAM: CT CHEST WITHOUT CONTRAST TECHNIQUE: Multidetector CT imaging of the chest  was performed following the standard protocol without IV contrast. COMPARISON:  Chest radiograph dated 04/20/2018 FINDINGS: Evaluation of this exam is limited in the absence of intravenous contrast. Cardiovascular:  There is no cardiomegaly or pericardial effusion. Multi vessel coronary vascular calcification. There is mild atherosclerotic calcification of the thoracic aorta. Slight prominence of the central pulmonary arteries may represent pulmonary hypertension. Clinical correlation is recommended. Mediastinum/Nodes: No hilar or mediastinal adenopathy. Evaluation however is limited in the absence of intravenous contrast. The esophagus is grossly unremarkable. No mediastinal fluid collection. Lungs/Pleura: There is a patchy area of ground-glass opacity in the right upper lobe most consistent with pneumonia. Clinical correlation and follow-up to resolution recommended. Additional smaller ground-glass nodular densities primarily in the right upper and right middle lobes likely represent developing infiltrate. An area of consolidative change in the lingula may represent atelectasis versus infiltrate. Additional scattered ground-glass nodular densities in the left upper lobe (series 3, image 51 and 73). There is a small left pleural effusion and associated left lung base atelectasis or infiltrate. No pneumothorax. The central airways are patent. Upper Abdomen: Postsurgical changes of gastric bypass. Left renal upper pole nonobstructing calculi. Musculoskeletal: Degenerative changes of the spine. No acute osseous pathology. IMPRESSION: 1. Patchy area of ground-glass opacity in the right upper lobe most consistent with pneumonia. Additional smaller ground-glass nodular densities bilaterally likely represent developing infiltrate and multifocal pneumonia. Clinical correlation and follow-up to resolution recommended. 2. Small left pleural effusion with associated left lung base atelectasis or infiltrate. 3. Coronary vascular calcification and . Electronically Signed   By: Anner Crete M.D.   On: 04/20/2018 23:05   Ct Abdomen Pelvis W Contrast  Result Date: 04/20/2018 CLINICAL DATA:  LEFT UPPER QUADRANT abdominal pain for 3  days. Nausea, vomiting. Question impacted. History of cholecystectomy, colon surgery, small intestine surgery. EXAM: CT ABDOMEN AND PELVIS WITH CONTRAST TECHNIQUE: Multidetector CT imaging of the abdomen and pelvis was performed using the standard protocol following bolus administration of intravenous contrast. CONTRAST:  136mL ISOVUE-300 IOPAMIDOL (ISOVUE-300) INJECTION 61% COMPARISON:  CT of the abdomen and pelvis on 05/29/2012 FINDINGS: Lower chest: There is coronary artery disease. Heart size is UPPER normal. There is a small LEFT pleural effusion. There is focal atelectasis within the anterior LEFT LOWER lobe. An area of ground-glass density is identified in the anterior RIGHT LOWER lobe, not further characterized and incompletely imaged. This measures at least 0.8 x 1.5 centimeters. Small nodule is identified at the RIGHT lung base, measuring 9 millimeters. Hepatobiliary: Status post cholecystectomy. There is dilatation of the intra and extrahepatic biliary ducts, stable since previous exam. No focal liver lesion. Prominent caudate lobe. Pancreas: There are calcifications within the pancreas, consistent with chronic pancreatitis. Mildly prominent pancreatic duct is stable. No focal pancreatic lesion. Spleen: The spleen is enlarged Adrenals/Urinary Tract: The adrenal glands are normal in appearance. No hydronephrosis or renal mass. The ureters are unremarkable. Urinary bladder is distended and otherwise normal in appearance. Stomach/Bowel: Gastric bypass surgery without evidence for obstruction or complication. There is a small bowel, unremarkable in appearance anastomosis in the RIGHT LOWER QUADRANT. A small bowel anastomosis is identified in the LEFT UPPER QUADRANT, also unremarkable. The appendix is well seen and has a normal appearance. There is significant distension of the ascending and transverse colon by stool. At the level of the splenic flexure, there is change in caliber from dilated to normal  caliber large bowel and a focal area of bowel wall thickening, best seen on coronal image 42/5. Although stool  is identified in the distal colon the distal colonic loops are not distended. There are scattered diverticula within the sigmoid colon. No acute diverticulitis. Vascular/Lymphatic: There is atherosclerotic calcification of the abdominal aorta not associated with aneurysm. No retroperitoneal or mesenteric adenopathy. Reproductive: Hysterectomy.  No adnexal mass. Other: No ascites. Postoperative changes in the anterior abdominal wall. Musculoskeletal: Degenerative disc disease at L5-S1. No suspicious lytic or blastic lesions are identified. IMPRESSION: 1. Significant stool and distension of the ascending and transverse colon with transition zone identified in the region of the splenic flexure. There is associated focal colonic wall thickening and irregularity raising the question of malignant lesion. Recommend further evaluation with colonoscopy. 2. There are changes of both lung bases. An incompletely imaged ground-glass opacity identified within the RIGHT LOWER lobe measuring at least 0.8 x 1.5 centimeters, warranting further evaluation. Recommend CT of the chest with intravenous contrast for further characterization of this and other pulmonary findings. 3. Gastric bypass surgery. 4. Unremarkable small bowel anastomoses. 5. Normal appendix. 6. Hysterectomy.  Cholecystectomy. 7.  Aortic atherosclerosis.  (ICD10-I70.0) 8. Sigmoid diverticulosis without acute diverticulitis. Electronically Signed   By: Nolon Nations M.D.   On: 04/20/2018 21:30   Dg Abdomen Acute W/chest  Result Date: 04/20/2018 CLINICAL DATA:  Patient reports left upper quadrant abdominal pain for past 3 days. Patient reports pain resolves after she sits still for a few minutes, but returns as soon as she attempts to move. Patient reports hx of 13 separate abdominal sx's. Hx hysterectomy, DM, lupus. Current smoker EXAM: DG ABDOMEN  ACUTE W/ 1V CHEST COMPARISON:  Chest radiographs, 08/08/2017. Abdomen and pelvis CT, 05/29/2012. FINDINGS: There is no bowel dilation to suggest obstruction. No air-fluid levels. No evidence of a significant adynamic ileus. No free air. Numerous surgical vascular clips are noted in the left upper quadrant. Bowel anastomosis staples noted in the left mid abdomen. Mild to moderate increased colonic stool burden. Cardiac silhouette is normal in size. There are prominent bronchovascular markings bilaterally. No lung consolidation. Small left pleural effusion. IMPRESSION: 1. No acute findings the abdomen or pelvis. No bowel obstruction or free air. 2. Mild to moderate increase colonic stool burden. 3. Small left pleural effusion.  No acute cardiopulmonary disease. Electronically Signed   By: Lajean Manes M.D.   On: 04/20/2018 18:15   ASSESSMENT AND PLAN:   1.  Multifocal community-acquired pneumonia- remains on 2 L of oxygen.   Procalcitonin negative. -Continue ceftriaxone and azithromycin -Wean oxygen as able  2.  Acute COPD exacerbation- has not improved much -Continue steroids, inhalers, supplemental oxygen  3.  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  -NPO -Continue nausea and pain meds -Tinea IV fluid -Gastroenterology and surgery following   4.  Hypertension-normotensive -IV hydralazine as needed while patient is NPO  5.  Type 2 diabetes- blood sugars well controlled, but may increase with solumedrol -Sensitive SSI  6. Microcytic anemia- likely iron deficiency in the setting of colonic mass -Check anemia panel  7.  Tobacco abuse.  Smoking cessation was discussed with patient this admission.  All the records are reviewed and case discussed with Care Management/Social Worker. Management plans discussed with the patient, family and they are in agreement.  CODE STATUS: Full Code  TOTAL TIME TAKING CARE OF THIS PATIENT: 45 minutes.    More than 50% of the time was spent in counseling/coordination of care: YES  POSSIBLE D/C IN 2-3 DAYS, DEPENDING ON CLINICAL CONDITION.   Valetta Fuller  D Mayo M.D on 04/21/2018 at 4:18 PM  Between 7am to 6pm - Pager - [phone]  After 6pm go to [email or web address] - Proofreader  [facility]  Office  [phone]  CC: Primary care physician; System, Pcp Not In  Note: This dictation was prepared with Dragon dictation along with smaller phrase technology. Any transcriptional errors that result from this process are unintentional.

## 2018-04-22 ENCOUNTER — Inpatient Hospital Stay: Payer: Medicaid Other

## 2018-04-22 DIAGNOSIS — K5909 Other constipation: Secondary | ICD-10-CM

## 2018-04-22 DIAGNOSIS — F4325 Adjustment disorder with mixed disturbance of emotions and conduct: Secondary | ICD-10-CM

## 2018-04-22 LAB — CBC
HEMATOCRIT: 29.8 % — AB (ref 36.0–46.0)
Hemoglobin: 8.5 g/dL — ABNORMAL LOW (ref 12.0–15.0)
MCH: 21.4 pg — ABNORMAL LOW (ref 26.0–34.0)
MCHC: 28.5 g/dL — ABNORMAL LOW (ref 30.0–36.0)
MCV: 75.1 fL — AB (ref 80.0–100.0)
NRBC: 0 % (ref 0.0–0.2)
Platelets: 366 10*3/uL (ref 150–400)
RBC: 3.97 MIL/uL (ref 3.87–5.11)
RDW: 20.7 % — ABNORMAL HIGH (ref 11.5–15.5)
WBC: 10.7 10*3/uL — ABNORMAL HIGH (ref 4.0–10.5)

## 2018-04-22 LAB — BASIC METABOLIC PANEL
ANION GAP: 9 (ref 5–15)
BUN: 12 mg/dL (ref 8–23)
CO2: 30 mmol/L (ref 22–32)
Calcium: 8.4 mg/dL — ABNORMAL LOW (ref 8.9–10.3)
Chloride: 100 mmol/L (ref 98–111)
Creatinine, Ser: 0.81 mg/dL (ref 0.44–1.00)
GFR calc Af Amer: >60 mL/min (ref >60)
GFR calc non Af Amer: >60 mL/min (ref >60)
Glucose, Bld: 131 mg/dL — ABNORMAL HIGH (ref 70–99)
POTASSIUM: 4 mmol/L (ref 3.5–5.1)
Sodium: 139 mmol/L (ref 135–145)

## 2018-04-22 LAB — GLUCOSE, CAPILLARY
GLUCOSE-CAPILLARY: 120 mg/dL — AB (ref 70–99)
GLUCOSE-CAPILLARY: 202 mg/dL — AB (ref 70–99)
Glucose-Capillary: 105 mg/dL — ABNORMAL HIGH (ref 70–99)
Glucose-Capillary: 127 mg/dL — ABNORMAL HIGH (ref 70–99)
Glucose-Capillary: 151 mg/dL — ABNORMAL HIGH (ref 70–99)
Glucose-Capillary: 179 mg/dL — ABNORMAL HIGH (ref 70–99)

## 2018-04-22 LAB — FOLATE: Folate: 23 ng/mL (ref >5.9)

## 2018-04-22 LAB — VITAMIN B12: Vitamin B-12: 543 pg/mL (ref 180–914)

## 2018-04-22 MED ORDER — OXYCODONE HCL 5 MG PO TABS
5.0000 mg | ORAL_TABLET | Freq: Four times a day (QID) | ORAL | Status: DC | PRN
  Administered 2018-04-22 – 2018-04-28 (×10): 10 mg via ORAL
  Filled 2018-04-22 (×11): qty 2

## 2018-04-22 MED ORDER — HYDROMORPHONE HCL 1 MG/ML IJ SOLN
0.5000 mg | INTRAMUSCULAR | Status: DC | PRN
  Administered 2018-04-23 – 2018-04-24 (×6): 0.5 mg via INTRAVENOUS
  Filled 2018-04-22 (×6): qty 0.5

## 2018-04-22 MED ORDER — POLYETHYLENE GLYCOL 3350 17 GM/SCOOP PO POWD
1.0000 | Freq: Once | ORAL | Status: DC
  Filled 2018-04-22: qty 255

## 2018-04-22 MED ORDER — FLEET ENEMA 7-19 GM/118ML RE ENEM: 1.0000 | ENEMA | Freq: Every day | RECTAL | Status: DC | PRN

## 2018-04-22 MED ORDER — FLUCONAZOLE 100 MG PO TABS
200.0000 mg | ORAL_TABLET | Freq: Every day | ORAL | Status: DC
  Administered 2018-04-22 – 2018-04-26 (×5): 200 mg via ORAL
  Filled 2018-04-22 (×7): qty 2

## 2018-04-22 MED ORDER — CLONAZEPAM 0.125 MG PO TBDP
0.2500 mg | ORAL_TABLET | Freq: Three times a day (TID) | ORAL | Status: DC
  Administered 2018-04-23 – 2018-04-28 (×15): 0.25 mg via ORAL
  Filled 2018-04-22 (×15): qty 2

## 2018-04-22 MED ORDER — HYDROXYZINE HCL 25 MG PO TABS: 25.0000 mg | ORAL_TABLET | Freq: Three times a day (TID) | ORAL | Status: DC | PRN

## 2018-04-22 MED ORDER — FLEET ENEMA 7-19 GM/118ML RE ENEM
1.0000 | ENEMA | Freq: Once | RECTAL | Status: AC
  Administered 2018-04-22: 1 via RECTAL

## 2018-04-22 MED ORDER — GABAPENTIN 400 MG PO CAPS
800.0000 mg | ORAL_CAPSULE | Freq: Three times a day (TID) | ORAL | Status: DC
  Administered 2018-04-22 – 2018-04-28 (×16): 800 mg via ORAL
  Filled 2018-04-22 (×17): qty 2

## 2018-04-22 MED ORDER — LORAZEPAM 1 MG PO TABS
1.0000 mg | ORAL_TABLET | Freq: Four times a day (QID) | ORAL | Status: DC | PRN
  Administered 2018-04-22 – 2018-04-27 (×4): 1 mg via ORAL
  Filled 2018-04-22 (×4): qty 1

## 2018-04-22 MED ORDER — LORAZEPAM 2 MG/ML IJ SOLN
1.0000 mg | Freq: Four times a day (QID) | INTRAMUSCULAR | Status: DC | PRN
  Filled 2018-04-22: qty 1

## 2018-04-22 MED ORDER — AMLODIPINE BESYLATE 5 MG PO TABS
5.0000 mg | ORAL_TABLET | Freq: Every day | ORAL | Status: DC
  Administered 2018-04-22 – 2018-04-25 (×4): 5 mg via ORAL
  Filled 2018-04-22 (×4): qty 1

## 2018-04-22 MED ORDER — MAGNESIUM CITRATE PO SOLN
1.0000 | Freq: Once | ORAL | Status: AC
  Administered 2018-04-22: 1 via ORAL
  Filled 2018-04-22: qty 296

## 2018-04-22 NOTE — Progress Notes (Signed)
Chaplain responded to a OR for spiritual support. Pt is struggling with the constant pain. She mask it with humor. There is also some remaining grieve from her mother's death 3 years ago. Her sister and brother in law were bedside. She has a desire to spend time with family. Frequent follow up is suggested   04/22/18 1900  Clinical Encounter Type  Visited With Patient and family together  Visit Type Spiritual support  Referral From Nurse  Recommendations  (follow up)  Spiritual Encounters  Spiritual Needs Prayer;Emotional  .

## 2018-04-22 NOTE — Progress Notes (Addendum)
[place] at [place] NAME: Christine Nichols    MR#:  [id number]  DATE OF BIRTH:  11-25-1955  SUBJECTIVE:  Patient endorsing left flank and left hip pain.  The pain is worse with coughing.  She states that her shortness of breath is better compared to yesterday.  She states that she "just wants to die".  She also states that her husband wants her to die because his life would be easier if she was not around.  REVIEW OF SYSTEMS:  Review of Systems  Constitutional: Negative for chills and fever.  HENT: Negative for congestion and sore throat.   Eyes: Negative for blurred vision and double vision.  Respiratory: Positive for cough, sputum production, shortness of breath and wheezing.   Cardiovascular: Negative for chest pain, palpitations and leg swelling.  Gastrointestinal: Positive for abdominal pain. Negative for nausea and vomiting.  Genitourinary: Negative for dysuria and frequency.  Musculoskeletal: Positive for back pain. Negative for neck pain.  Neurological: Negative for dizziness and headaches.  Psychiatric/Behavioral: Negative for depression. The patient is nervous/anxious.     DRUG ALLERGIES:   Allergies  Allergen Reactions  . Sulfa Antibiotics Anaphylaxis  . Sulfasalazine Anaphylaxis  . Codeine Nausea Only  . Lavender Oil     Sinus swelling, nausea  . Penicillins Rash    Has patient had a PCN reaction causing immediate rash, facial/tongue/throat swelling, SOB or lightheadedness with hypotension: Yes Has patient had a PCN reaction causing severe rash involving mucus membranes or skin necrosis: No Has patient had a PCN reaction that required hospitalization: No Has patient had a PCN reaction occurring within the last 10 years: No If all of the above answers are "NO", then may proceed with Cephalosporin use.   VITALS:  Blood pressure 130/76, pulse 87, temperature 98.5 F (36.9 C), temperature source Oral, resp. rate 17, weight  95.6 kg, SpO2 (!) 89 %. PHYSICAL EXAMINATION:  Physical Exam  GENERAL:  62 year-old patient lying in the bed, coughing intermittently.  Appears uncomfortable and anxious. EYES: Pupils equal, round, reactive to light and accommodation. No scleral icterus. Extraocular muscles intact.  HEENT: Head atraumatic, normocephalic. Oropharynx and nasopharynx clear.  Moist mucous membranes NECK:  Supple, no jugular venous distention. No thyroid enlargement, no tenderness.  LUNGS:  moderate expiratory wheezing throughout all lung fields.  Mild use of accessory muscles of respiration noted.  Nasal cannula in place CARDIOVASCULAR: RRR, S1, S2 normal. No S3/S4.  ABDOMEN: There is tenderness with palpation in the left upper quadrant, no guarding/rebound.  Otherwise, abdomen is obese, soft, nondistended. Bowel sounds present. EXTREMITIES: No pedal edema, cyanosis, or clubbing.  NEUROLOGIC: No focal weakness. + Global weakness.  Sensation intact to light touch throughout. PSYCHIATRIC: The patient is alert and oriented x 3.  Appears very anxious.  Becomes tearful at multiple points throughout exam. SKIN: No obvious rash, lesion, or ulcer.  LABORATORY PANEL:  Female CBC Recent Labs  Lab 04/22/18 0350  WBC 10.7*  HGB 8.5*  HCT 29.8*  PLT 366   ------------------------------------------------------------------------------------------------------------------ Chemistries  Recent Labs  Lab 04/20/18 1601  04/22/18 0350  NA 134*   < > 139  K 4.4   < > 4.0  CL 98   < > 100  CO2 27   < > 30  GLUCOSE 171*   < > 131*  BUN 10   < > 12  CREATININE 1.05*   < > 0.81  CALCIUM 9.0   < > 8.4*  AST 34  --   --   ALT 17  --   --   ALKPHOS 65  --   --   BILITOT 0.7  --   --    < > = values in this interval not displayed.   RADIOLOGY:  No results found. ASSESSMENT AND PLAN:   Multifocal community-acquired pneumonia- remains on 2 L of oxygen.   Procalcitonin negative, but CT chest consistent with  pneumonia. -Continue ceftriaxone and azithromycin -Wean oxygen as able  Acute COPD exacerbation-improved -Continue steroids, inhalers, supplemental oxygen  Suspected large bowel mass- has not had a bowel movement.  -Continue clear liquid diet -GI following-plan to start slow bowel prep -Plan for colonoscopy this admission -Surgery following-recommend colonoscopy first -Continue nausea and pain meds -Continue IV fluid  Uncontrolled anxiety and depression-patient visibly anxious and tearful throughout exam.  States that she "just wants to die". -Continue Ativan IV as needed for anxiety -Psychiatry consult  Left flank ecchymosis-unclear etiology.  Patient endorses pain in the left flank that is worse with coughing.  Denies any falls or trauma to the area. -Will obtain left rib and left hip x-rays  Hypertension- BPs mildly elevated -Restart home Norvasc -IV hydralazine as needed while patient is NPO  Type 2 diabetes- blood sugars well controlled, but may increase with solumedrol -Sensitive SSI  Microcytic anemia- likely iron deficiency in the setting of colonic mass.  Anemia panel unremarkable. -Monitor  Tobacco abuse.  Smoking cessation was discussed with patient this admission.  All the records are reviewed and case discussed with Care Management/Social Worker. Management plans discussed with the patient, family and they are in agreement.  CODE STATUS: Full Code  TOTAL TIME TAKING CARE OF THIS PATIENT: 45 minutes.   More than 50% of the time was spent in counseling/coordination of care: YES  POSSIBLE D/C unknown, DEPENDING ON CLINICAL CONDITION.   Berna Spare Shaneque Merkle M.D on 04/22/2018 at 2:44 PM  Between 7am to 6pm - Pager - [phone]  After 6pm go to [email or web address] - Proofreader  [facility]  Office  [phone]  CC: Primary care physician; System, Pcp Not In  Note: This dictation was prepared with Dragon dictation along with  smaller phrase technology. Any transcriptional errors that result from this process are unintentional.

## 2018-04-22 NOTE — Progress Notes (Addendum)
Christine Darby, MD [address]  [place]  [place], [place] [address]  Main: [phone]  Fax: [phone] Pager: [phone]   Subjective: Continues to be tearful, her sister is bedside, she has ongoing left-sided upper abdominal pain.  She had a small hard stool after Fleet enema, passing gas.  Tolerating bowel regimen well, denies nausea or vomiting, worsening abdominal distention    Objective: Vital signs in last 24 hours: Vitals:   04/21/18 2023 04/22/18 0500 04/22/18 0633 04/22/18 1228  BP: (!) 134/94  132/88 130/76  Pulse: 74  68 87  Resp: 18  18 17   Temp: 98.2 F (36.8 C)  97.7 F (36.5 C) 98.5 F (36.9 C)  TempSrc: Oral  Oral Oral  SpO2: 91%  93% (!) 89%  Weight:  95.6 kg     Weight change: -0.1 kg  Intake/Output Summary (Last 24 hours) at 04/22/2018 1950 Last data filed at 04/22/2018 1701 Gross per 24 hour  Intake 2916.63 ml  Output 2150 ml  Net 766.63 ml     Exam: Heart:: Regular rate and rhythm or S1S2 present Lungs: normal and clear to auscultation Abdomen: soft, distended, tympanic, tender in left upper quadrant Bowel sounds   Lab Results: CBC Latest Ref Rng & Units 04/22/2018 04/21/2018 04/20/2018  WBC 4.0 - 10.5 K/uL 10.7(H) 11.3(H) 13.4(H)  Hemoglobin 12.0 - 15.0 g/dL 8.5(L) 8.7(L) 9.6(L)  Hematocrit 36.0 - 46.0 % 29.8(L) 31.8(L) 33.5(L)  Platelets 150 - 400 K/uL 366 350 398   CMP Latest Ref Rng & Units 04/22/2018 04/21/2018 04/20/2018  Glucose 70 - 99 mg/dL 131(H) 113(H) 171(H)  BUN 8 - 23 mg/dL 12 10 10   Creatinine 0.44 - 1.00 mg/dL 0.81 0.96 1.05(H)  Sodium 135 - 145 mmol/L 139 138 134(L)  Potassium 3.5 - 5.1 mmol/L 4.0 3.9 4.4  Chloride 98 - 111 mmol/L 100 101 98  CO2 22 - 32 mmol/L 30 30 27   Calcium 8.9 - 10.3 mg/dL 8.4(L) 8.6(L) 9.0  Total Protein 6.5 - 8.1 g/dL - - 6.8  Total Bilirubin 0.3 - 1.2 mg/dL - - 0.7  Alkaline Phos 38 - 126 U/L - - 65  AST 15 - 41 U/L - - 34  ALT 0 - 44 U/L - - 17   Micro Results: No  results found for this or any previous visit (from the past 240 hour(s)). Studies/Results: Dg Ribs Unilateral Left  Result Date: 04/22/2018 CLINICAL DATA:  Left flank and hip pain with cough and dyspnea. EXAM: LEFT RIBS - 2 VIEW COMPARISON:  None. FINDINGS: Acute displaced left lateral ninth rib fracture without associated pneumothorax. Left basilar atelectasis is noted. Cardiomegaly is noted with aortic atherosclerosis. Numerous chain sutures project over the left hemiabdomen and upper quadrant. Facet arthrosis of the included lower cervical spine. The included left shoulder is intact. IMPRESSION: 1. Acute displaced left lateral ninth rib fracture without associated pneumothorax or hemothorax. 2. Cardiomegaly with aortic atherosclerosis. Electronically Signed   By: Ashley Royalty M.D.   On: 04/22/2018 15:05   Ct Chest Wo Contrast  Result Date: 04/20/2018 CLINICAL DATA:  62 year old female with shortness of breath. EXAM: CT CHEST WITHOUT CONTRAST TECHNIQUE: Multidetector CT imaging of the chest was performed following the standard protocol without IV contrast. COMPARISON:  Chest radiograph dated 04/20/2018 FINDINGS: Evaluation of this exam is limited in the absence of intravenous contrast. Cardiovascular: There is no cardiomegaly or pericardial effusion. Multi vessel coronary vascular calcification. There is mild atherosclerotic calcification of the thoracic aorta. Slight  prominence of the central pulmonary arteries may represent pulmonary hypertension. Clinical correlation is recommended. Mediastinum/Nodes: No hilar or mediastinal adenopathy. Evaluation however is limited in the absence of intravenous contrast. The esophagus is grossly unremarkable. No mediastinal fluid collection. Lungs/Pleura: There is a patchy area of ground-glass opacity in the right upper lobe most consistent with pneumonia. Clinical correlation and follow-up to resolution recommended. Additional smaller ground-glass nodular densities  primarily in the right upper and right middle lobes likely represent developing infiltrate. An area of consolidative change in the lingula may represent atelectasis versus infiltrate. Additional scattered ground-glass nodular densities in the left upper lobe (series 3, image 51 and 73). There is a small left pleural effusion and associated left lung base atelectasis or infiltrate. No pneumothorax. The central airways are patent. Upper Abdomen: Postsurgical changes of gastric bypass. Left renal upper pole nonobstructing calculi. Musculoskeletal: Degenerative changes of the spine. No acute osseous pathology. IMPRESSION: 1. Patchy area of ground-glass opacity in the right upper lobe most consistent with pneumonia. Additional smaller ground-glass nodular densities bilaterally likely represent developing infiltrate and multifocal pneumonia. Clinical correlation and follow-up to resolution recommended. 2. Small left pleural effusion with associated left lung base atelectasis or infiltrate. 3. Coronary vascular calcification and . Electronically Signed   By: Anner Crete M.D.   On: 04/20/2018 23:05   Ct Abdomen Pelvis W Contrast  Result Date: 04/20/2018 CLINICAL DATA:  LEFT UPPER QUADRANT abdominal pain for 3 days. Nausea, vomiting. Question impacted. History of cholecystectomy, colon surgery, small intestine surgery. EXAM: CT ABDOMEN AND PELVIS WITH CONTRAST TECHNIQUE: Multidetector CT imaging of the abdomen and pelvis was performed using the standard protocol following bolus administration of intravenous contrast. CONTRAST:  157mL ISOVUE-300 IOPAMIDOL (ISOVUE-300) INJECTION 61% COMPARISON:  CT of the abdomen and pelvis on 05/29/2012 FINDINGS: Lower chest: There is coronary artery disease. Heart size is UPPER normal. There is a small LEFT pleural effusion. There is focal atelectasis within the anterior LEFT LOWER lobe. An area of ground-glass density is identified in the anterior RIGHT LOWER lobe, not further  characterized and incompletely imaged. This measures at least 0.8 x 1.5 centimeters. Small nodule is identified at the RIGHT lung base, measuring 9 millimeters. Hepatobiliary: Status post cholecystectomy. There is dilatation of the intra and extrahepatic biliary ducts, stable since previous exam. No focal liver lesion. Prominent caudate lobe. Pancreas: There are calcifications within the pancreas, consistent with chronic pancreatitis. Mildly prominent pancreatic duct is stable. No focal pancreatic lesion. Spleen: The spleen is enlarged Adrenals/Urinary Tract: The adrenal glands are normal in appearance. No hydronephrosis or renal mass. The ureters are unremarkable. Urinary bladder is distended and otherwise normal in appearance. Stomach/Bowel: Gastric bypass surgery without evidence for obstruction or complication. There is a small bowel, unremarkable in appearance anastomosis in the RIGHT LOWER QUADRANT. A small bowel anastomosis is identified in the LEFT UPPER QUADRANT, also unremarkable. The appendix is well seen and has a normal appearance. There is significant distension of the ascending and transverse colon by stool. At the level of the splenic flexure, there is change in caliber from dilated to normal caliber large bowel and a focal area of bowel wall thickening, best seen on coronal image 42/5. Although stool is identified in the distal colon the distal colonic loops are not distended. There are scattered diverticula within the sigmoid colon. No acute diverticulitis. Vascular/Lymphatic: There is atherosclerotic calcification of the abdominal aorta not associated with aneurysm. No retroperitoneal or mesenteric adenopathy. Reproductive: Hysterectomy.  No adnexal mass. Other: No ascites. Postoperative  changes in the anterior abdominal wall. Musculoskeletal: Degenerative disc disease at L5-S1. No suspicious lytic or blastic lesions are identified. IMPRESSION: 1. Significant stool and distension of the ascending  and transverse colon with transition zone identified in the region of the splenic flexure. There is associated focal colonic wall thickening and irregularity raising the question of malignant lesion. Recommend further evaluation with colonoscopy. 2. There are changes of both lung bases. An incompletely imaged ground-glass opacity identified within the RIGHT LOWER lobe measuring at least 0.8 x 1.5 centimeters, warranting further evaluation. Recommend CT of the chest with intravenous contrast for further characterization of this and other pulmonary findings. 3. Gastric bypass surgery. 4. Unremarkable small bowel anastomoses. 5. Normal appendix. 6. Hysterectomy.  Cholecystectomy. 7.  Aortic atherosclerosis.  (ICD10-I70.0) 8. Sigmoid diverticulosis without acute diverticulitis. Electronically Signed   By: Nolon Nations M.D.   On: 04/20/2018 21:30   Dg Hip Unilat With Pelvis 2-3 Views Left  Result Date: 04/22/2018 CLINICAL DATA:  Left flank and left hip pain made worse when coughing. EXAM: DG HIP (WITH OR WITHOUT PELVIS) 2-3V LEFT COMPARISON:  Coronal and sagittal reconstructed images through the pelvis and hips from an abdominal and pelvic CT scan dated April 20, 2018 FINDINGS: The bony pelvis is subjectively adequately mineralized. There is no lytic nor blastic lesion. No acute pelvic fracture is observed. AP and lateral views of the left hip reveal preservation of the joint space. The articular surfaces of the left femoral head and acetabulum remains smoothly rounded. The femoral neck, intertrochanteric, and subtrochanteric regions are normal. IMPRESSION: There is no acute or significant chronic bony abnormality of the left hip. Electronically Signed   By: David  Martinique M.D.   On: 04/22/2018 15:04   Medications: I have reviewed the patient's current medications. Scheduled Meds: . amLODipine  5 mg Oral Daily  . docusate sodium  100 mg Oral BID  . fluconazole  200 mg Oral Daily  . folic acid  1 mg  Intravenous Daily  . gabapentin  800 mg Oral TID  . insulin aspart  0-9 Units Subcutaneous Q6H  . magnesium citrate  1 Bottle Oral Once  . mouth rinse  15 mL Mouth Rinse BID  . methylPREDNISolone (SOLU-MEDROL) injection  40 mg Intravenous Q12H  . polyethylene glycol  17 g Oral BID  . polyethylene glycol powder  1 Container Oral Once   Continuous Infusions: . azithromycin Stopped (04/21/18 0529)  . cefTRIAXone (ROCEPHIN)  IV Stopped (04/22/18 0055)   PRN Meds:.acetaminophen **OR** acetaminophen, albuterol, bisacodyl, hydrALAZINE, HYDROmorphone (DILAUDID) injection, hydrOXYzine, LORazepam **OR** LORazepam, ondansetron **OR** ondansetron (ZOFRAN) IV, oxyCODONE, [START ON 04/23/2018] sodium phosphate, traZODone   Assessment: Active Problems:   Large bowel obstruction (HCC)  Christine Nichols is a 62 y.o. female with history of Roux-en-Y gastric bypass, chronic hep C, status post treatment with confirmed eradication, no cirrhosis, bullous pemphigoid admitted with 1 week history of worsening left upper quadrant abdominal pain and distention, constipation, CT revealed probable colonic obstruction at the splenic flexure secondary to underlying mass or severe constipation.  Currently, she is also treated for pneumonia and COPD exacerbation based on the CT chest Patient is evaluated by general surgery who did not recommend surgical intervention at this time.  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

## 2018-04-22 NOTE — Progress Notes (Signed)
RR called @2120 . Upon arrival pt noted lying in bed awake and alert answers appropriate. Pt on 2lnc and VS WNL. According to nursing pt was up to sitting on side of  bed and "passed out". Pt did not lose a pulse. . Pt was assessed and positioned in bed without difficulty and will be monitored with a safety tele monitor per MD. Pt stable and will remain on 2c

## 2018-04-22 NOTE — Progress Notes (Addendum)
[facility] Day(s): 2.   Post op day(s):  Marland Kitchen   Interval History: Patient seen and examined, no acute events or new complaints overnight. Patient reports feeling very scared. Continue with left sided abdominal pain. Denies nausea or vomiting with the clear liquids.  Vital signs in last 24 hours: [min-max] current  Temp:  [97.7 F (36.5 C)-98.4 F (36.9 C)] 97.7 F (36.5 C) (10/29 9628) Pulse Rate:  [68-82] 68 (10/29 0633) Resp:  [16-18] 18 (10/29 3662) BP: (132-145)/(88-94) 132/88 (10/29 9476) SpO2:  [90 %-93 %] 93 % (10/29 5465) Weight:  [95.6 kg] 95.6 kg (10/29 0500)       Weight: 95.6 kg     Physical Exam:  Constitutional: alert, cooperative and no distress  Respiratory: breathing non-labored at rest  Cardiovascular: regular rate and sinus rhythm  Gastrointestinal: soft, tender on left upper and lower quadrant, and non-distended. Left sided ecchymosis (unknonw etiology, denies trauma)  Labs:  CBC Latest Ref Rng & Units 04/22/2018 04/21/2018 04/20/2018  WBC 4.0 - 10.5 K/uL 10.7(H) 11.3(H) 13.4(H)  Hemoglobin 12.0 - 15.0 g/dL 8.5(L) 8.7(L) 9.6(L)  Hematocrit 36.0 - 46.0 % 29.8(L) 31.8(L) 33.5(L)  Platelets 150 - 400 K/uL 366 350 398   CMP Latest Ref Rng & Units 04/22/2018 04/21/2018 04/20/2018  Glucose 70 - 99 mg/dL 131(H) 113(H) 171(H)  BUN 8 - 23 mg/dL 12 10 10   Creatinine 0.44 - 1.00 mg/dL 0.81 0.96 1.05(H)  Sodium 135 - 145 mmol/L 139 138 134(L)  Potassium 3.5 - 5.1 mmol/L 4.0 3.9 4.4  Chloride 98 - 111 mmol/L 100 101 98  CO2 22 - 32 mmol/L 30 30 27   Calcium 8.9 - 10.3 mg/dL 8.4(L) 8.6(L) 9.0  Total Protein 6.5 - 8.1 g/dL - - 6.8  Total Bilirubin 0.3 - 1.2 mg/dL - - 0.7  Alkaline Phos 38 - 126 U/L - - 65  AST 15 - 41 U/L - - 34  ALT 0 - 44 U/L - - 17    Imaging studies: No new pertinent imaging studies   Assessment/Plan:  62 y.o. female with abdominal pian with suspected large bowel mass as per CT scan, complicated by pertinent  comorbidities including COPD with acute exacerbation, multifocal pneumonia, Hypertension, Smoker, multiple abdominal surgeries, obesity with gastric bypass, among recent varicella, Chronic hepatitis C, among others.  Patient without major changes on her clinical status. Today more calm but still teary and anxious. I agree with Miralax. No contraindication for slow bowel prep. Will decide if surgery is needed after complete workup is done. It is difficult to talk to patient about surgery at this moment due to the anxiety, so when we have all the information, I recommend to have the family involved to make decision. Again psychiatry evaluation will be of benefit. Will follow.   Arnold Long, MD

## 2018-04-22 NOTE — Consult Note (Signed)
[facility] Face-to-Face Psychiatry Consult   Reason for Consult: Consult for this 62 year old woman with multiple chronic medical problems.  Concern about acute anxiety and also some question about capacity Referring Physician: Bobbye Charleston Patient Identification: Christine Nichols MRN:  [id number] Principal Diagnosis: Adjustment disorder with mixed disturbance of emotions and conduct Diagnosis:   Patient Active Problem List   Diagnosis Date Noted  . Adjustment disorder with mixed disturbance of emotions and conduct [F43.25] 04/22/2018  . Large bowel obstruction ([place]) [K56.609] 04/20/2018  . Chronic hepatitis C without hepatic coma ([place]) [B18.2]   . Varicella [B01.9] 10/16/2017    Total Time spent with patient: 1 hour  Subjective:   Christine Nichols is a 62 y.o. female patient admitted with "I am having a nervous breakdown".  HPI: Patient seen and interviewed chart reviewed.  This is a 62 year old woman with chronic medical problems including lupus and bulbous pemphigoid.  Has a history of a very large number of bowel surgeries.  In the hospital with COPD and pneumonia but now it is been found that she has what looks like a bowel obstruction and a possible mass.  Patient is extremely anxious.  At times agitated.  There was some question about how well she understood the medical plan.  On interview today the patient is able to describe her medical problems to me spontaneously in detail.  There is no question that she definitely does understand her full medical situation.  She understands the possible outcome especially of having colon cancer and understands the prep and the nature of a colonoscopy.  As far as capacity I think she definitely has capacity although at times she may get so panicky that it is hard to communicate with her.  Patient started the interview making jokes with me and talking calmly but as she got more worked up talking about her husband and how she thinks her husband makes her feel guilty  for her medical problems she started to get more agitated.  Patient makes comments about feeling like she might as well be dead but makes it clear that she does not actually want to die and has no thought of killing herself.  Patient is upset that she is not been able to take her citalopram because it is being held while she is on Diflucan.  Social history: Patient does have adult children but complains that they are not attentive either.  Apparently her husband had a frontal lobe aneurysm which has caused him to be chronically irritable demanding and un-insightful.  Medical history: Patient has chronic rheumatologic conditions multiple bowel surgeries hepatitis C pneumonia COPD  Substance abuse history: Denies alcohol or drug abuse or any past substance abuse.  Past Psychiatric History: No previous hospitalization no previous suicide attempts.  She has been treated with medication for anxiety in the past most specifically with citalopram.  She is frustrated because her doctors withhold the citalopram while she is taking Diflucan because of concern about serotonin syndrome and QT prolongation.  She says other SSRIs were not tolerable.  Risk to Self:   Risk to Others:   Prior Inpatient Therapy:   Prior Outpatient Therapy:    Past Medical History:  Past Medical History:  Diagnosis Date  . Arthritis   . Bullous pemphigoid   . Diabetes mellitus without complication ([place])   . Hypertension   . Lupus ([place])   . Neuropathy     Past Surgical History:  Procedure Laterality Date  . ABDOMINAL HYSTERECTOMY    .  ABDOMINAL SURGERY     Family History: No family history on file. Family Psychiatric  History: None known Social History:  Social History   Substance and Sexual Activity  Alcohol Use Never  . Frequency: Never     Social History   Substance and Sexual Activity  Drug Use Never    Social History   Socioeconomic History  . Marital status: Married    Spouse name: Not on file  .  Number of children: Not on file  . Years of education: Not on file  . Highest education level: Not on file  Occupational History  . Not on file  Social Needs  . Financial resource strain: Not on file  . Food insecurity:    Worry: Not on file    Inability: Not on file  . Transportation needs:    Medical: Not on file    Non-medical: Not on file  Tobacco Use  . Smoking status: Heavy Tobacco Smoker  . Smokeless tobacco: Never Used  Substance and Sexual Activity  . Alcohol use: Never    Frequency: Never  . Drug use: Never  . Sexual activity: Not on file  Lifestyle  . Physical activity:    Days per week: Not on file    Minutes per session: Not on file  . Stress: Not on file  Relationships  . Social connections:    Talks on phone: Not on file    Gets together: Not on file    Attends religious service: Not on file    Active member of club or organization: Not on file    Attends meetings of clubs or organizations: Not on file    Relationship status: Not on file  Other Topics Concern  . Not on file  Social History Narrative  . Not on file   Additional Social History:    Allergies:   Allergies  Allergen Reactions  . Sulfa Antibiotics Anaphylaxis  . Sulfasalazine Anaphylaxis  . Codeine Nausea Only  . Lavender Oil     Sinus swelling, nausea  . Penicillins Rash    Has patient had a PCN reaction causing immediate rash, facial/tongue/throat swelling, SOB or lightheadedness with hypotension: Yes Has patient had a PCN reaction causing severe rash involving mucus membranes or skin necrosis: No Has patient had a PCN reaction that required hospitalization: No Has patient had a PCN reaction occurring within the last 10 years: No If all of the above answers are "NO", then may proceed with Cephalosporin use.    Labs:  Results for orders placed or performed during the hospital encounter of 04/20/18 (from the past 48 hour(s))  Basic metabolic panel     Status: Abnormal   Collection  Time: 04/21/18  5:41 AM  Result Value Ref Range   Sodium 138 135 - 145 mmol/L   Potassium 3.9 3.5 - 5.1 mmol/L   Chloride 101 98 - 111 mmol/L   CO2 30 22 - 32 mmol/L   Glucose, Bld 113 (H) 70 - 99 mg/dL   BUN 10 8 - 23 mg/dL   Creatinine, Ser 0.96 0.44 - 1.00 mg/dL   Calcium 8.6 (L) 8.9 - 10.3 mg/dL   GFR calc non Af Amer >60 >60 mL/min   GFR calc Af Amer >60 >60 mL/min    Comment: (NOTE) The eGFR has been calculated using the CKD EPI equation. This calculation has not been validated in all clinical situations. eGFR's persistently <60 mL/min signify possible Chronic Kidney Disease.    Anion gap  7 5 - 15    Comment: Performed at [facility], [address] [place], [place] [address]  CBC     Status: Abnormal   Collection Time: 04/21/18  5:41 AM  Result Value Ref Range   WBC 11.3 (H) 4.0 - 10.5 K/uL   RBC 4.11 3.87 - 5.11 MIL/uL   Hemoglobin 8.7 (L) 12.0 - 15.0 g/dL    Comment: Reticulocyte Hemoglobin testing may be clinically indicated, consider ordering this additional test [id number]    HCT 31.8 (L) 36.0 - 46.0 %   MCV 77.4 (L) 80.0 - 100.0 fL   MCH 21.2 (L) 26.0 - 34.0 pg   MCHC 27.4 (L) 30.0 - 36.0 g/dL   RDW 21.2 (H) 11.5 - 15.5 %   Platelets 350 150 - 400 K/uL   nRBC 0.0 0.0 - 0.2 %    Comment: Performed at [facility], [address] [place], [place] [address]  Ferritin     Status: None   Collection Time: 04/21/18  5:41 AM  Result Value Ref Range   Ferritin 28 11 - 307 ng/mL    Comment: Performed at [facility], [address] [place], [place] [address]  Iron and TIBC     Status: None   Collection Time: 04/21/18  5:41 AM  Result Value Ref Range   Iron 71 28 - 170 ug/dL   TIBC 324 250 - 450 ug/dL   Saturation Ratios 22 10.4 - 31.8 %   UIBC 253 ug/dL    Comment: Performed at [facility], [address] [place], [place] [address]  Procalcitonin - Baseline     Status: None   Collection Time: 04/21/18  5:41 AM   Result Value Ref Range   Procalcitonin <0.10 ng/mL    Comment:        Interpretation: PCT (Procalcitonin) <= 0.5 ng/mL: Systemic infection (sepsis) is not likely. Local bacterial infection is possible. (NOTE)       Sepsis PCT Algorithm           Lower Respiratory Tract                                      Infection PCT Algorithm    ----------------------------     ----------------------------         PCT < 0.25 ng/mL                PCT < 0.10 ng/mL         Strongly encourage             Strongly discourage   discontinuation of antibiotics    initiation of antibiotics    ----------------------------     -----------------------------       PCT 0.25 - 0.50 ng/mL            PCT 0.10 - 0.25 ng/mL               OR       >80% decrease in PCT            Discourage initiation of                                            antibiotics      Encourage discontinuation  of antibiotics    ----------------------------     -----------------------------         PCT >= 0.50 ng/mL              PCT 0.26 - 0.50 ng/mL               AND        <80% decrease in PCT             Encourage initiation of                                             antibiotics       Encourage continuation           of antibiotics    ----------------------------     -----------------------------        PCT >= 0.50 ng/mL                  PCT > 0.50 ng/mL               AND         increase in PCT                  Strongly encourage                                      initiation of antibiotics    Strongly encourage escalation           of antibiotics                                     -----------------------------                                           PCT <= 0.25 ng/mL                                                 OR                                        > 80% decrease in PCT                                     Discontinue / Do not initiate                                             antibiotics Performed  at [facility], [address] [place], [place] [address]   Glucose, capillary     Status: Abnormal   Collection Time: 04/21/18  8:05 AM  Result Value Ref Range   Glucose-Capillary 125 (H) 70 - 99 mg/dL  Comment 1 Notify RN   Glucose, capillary     Status: Abnormal   Collection Time: 04/21/18  6:14 PM  Result Value Ref Range   Glucose-Capillary 175 (H) 70 - 99 mg/dL   Comment 1 Notify RN   Vitamin B12     Status: None   Collection Time: 04/21/18  9:49 PM  Result Value Ref Range   Vitamin B-12 543 180 - 914 pg/mL    Comment: (NOTE) This assay is not validated for testing neonatal or myeloproliferative syndrome specimens for Vitamin B12 levels. Performed at [facility], [place] [address] [place], [place] [address]   Folate     Status: None   Collection Time: 04/21/18  9:49 PM  Result Value Ref Range   Folate 23.0 >5.9 ng/mL    Comment: Performed at [facility], [address] [place], [place] [address]  Glucose, capillary     Status: Abnormal   Collection Time: 04/22/18 12:13 AM  Result Value Ref Range   Glucose-Capillary 120 (H) 70 - 99 mg/dL  CBC     Status: Abnormal   Collection Time: 04/22/18  3:50 AM  Result Value Ref Range   WBC 10.7 (H) 4.0 - 10.5 K/uL   RBC 3.97 3.87 - 5.11 MIL/uL   Hemoglobin 8.5 (L) 12.0 - 15.0 g/dL    Comment: Reticulocyte Hemoglobin testing may be clinically indicated, consider ordering this additional test [id number]    HCT 29.8 (L) 36.0 - 46.0 %   MCV 75.1 (L) 80.0 - 100.0 fL   MCH 21.4 (L) 26.0 - 34.0 pg   MCHC 28.5 (L) 30.0 - 36.0 g/dL   RDW 20.7 (H) 11.5 - 15.5 %   Platelets 366 150 - 400 K/uL   nRBC 0.0 0.0 - 0.2 %    Comment: Performed at [facility], [address] [place], [place] [address]  Basic metabolic panel     Status: Abnormal   Collection Time: 04/22/18  3:50 AM  Result Value Ref Range   Sodium 139 135 - 145 mmol/L   Potassium 4.0 3.5 - 5.1 mmol/L   Chloride 100 98 - 111 mmol/L    CO2 30 22 - 32 mmol/L   Glucose, Bld 131 (H) 70 - 99 mg/dL   BUN 12 8 - 23 mg/dL   Creatinine, Ser 0.81 0.44 - 1.00 mg/dL   Calcium 8.4 (L) 8.9 - 10.3 mg/dL   GFR calc non Af Amer >60 >60 mL/min   GFR calc Af Amer >60 >60 mL/min    Comment: (NOTE) The eGFR has been calculated using the CKD EPI equation. This calculation has not been validated in all clinical situations. eGFR's persistently <60 mL/min signify possible Chronic Kidney Disease.    Anion gap 9 5 - 15    Comment: Performed at [facility], [address] [place], [place] [address]  Glucose, capillary     Status: Abnormal   Collection Time: 04/22/18  6:20 AM  Result Value Ref Range   Glucose-Capillary 105 (H) 70 - 99 mg/dL  Glucose, capillary     Status: Abnormal   Collection Time: 04/22/18 11:48 AM  Result Value Ref Range   Glucose-Capillary 179 (H) 70 - 99 mg/dL  Glucose, capillary     Status: Abnormal   Collection Time: 04/22/18  6:47 PM  Result Value Ref Range   Glucose-Capillary 202 (H) 70 - 99 mg/dL    Current Facility-Administered Medications  Medication Dose Route Frequency Provider Last Rate  Last Dose  . acetaminophen (TYLENOL) tablet 650 mg  650 mg Oral Q6H PRN Amelia Jo, MD       Or  . acetaminophen (TYLENOL) suppository 650 mg  650 mg Rectal Q6H PRN Amelia Jo, MD      . albuterol (PROVENTIL) (2.5 MG/3ML) 0.083% nebulizer solution 2.5 mg  2.5 mg Nebulization Q4H PRN Hillary Bow, MD   2.5 mg at 04/22/18 0320  . amLODipine (NORVASC) tablet 5 mg  5 mg Oral Daily Mayo, Pete Pelt, MD   5 mg at 04/22/18 1248  . azithromycin (ZITHROMAX) 500 mg in sodium chloride 0.9 % 250 mL IVPB  500 mg Intravenous Q24H Amelia Jo, MD   Stopped at 04/21/18 [phone]  . bisacodyl (DULCOLAX) EC tablet 5 mg  5 mg Oral Daily PRN Amelia Jo, MD   5 mg at 04/22/18 0850  . cefTRIAXone (ROCEPHIN) 1 g in sodium chloride 0.9 % 100 mL IVPB  1 g Intravenous Q24H Amelia Jo, MD   Stopped at 04/22/18 0055  .  clonazepam (KLONOPIN) disintegrating tablet 0.25 mg  0.25 mg Oral TID ,  T, MD      . docusate sodium (COLACE) capsule 100 mg  100 mg Oral BID Amelia Jo, MD   100 mg at 04/22/18 0850  . fluconazole (DIFLUCAN) tablet 200 mg  200 mg Oral Daily Mayo, Pete Pelt, MD   200 mg at 04/22/18 1724  . folic acid injection 1 mg  1 mg Intravenous Daily Amelia Jo, MD   1 mg at 04/22/18 1043  . gabapentin (NEURONTIN) capsule 800 mg  800 mg Oral TID Sela Hua, MD   800 mg at 04/22/18 1723  . hydrALAZINE (APRESOLINE) injection 5 mg  5 mg Intravenous Q4H PRN Mayo, Pete Pelt, MD      . HYDROmorphone (DILAUDID) injection 0.5 mg  0.5 mg Intravenous Q2H PRN Mayo, Pete Pelt, MD      . hydrOXYzine (ATARAX/VISTARIL) tablet 25 mg  25 mg Oral TID PRN Mayo, Pete Pelt, MD      . insulin aspart (novoLOG) injection 0-9 Units  0-9 Units Subcutaneous Q6H Mayo, Pete Pelt, MD   3 Units at 04/22/18 1900  . LORazepam (ATIVAN) tablet 1 mg  1 mg Oral Q6H PRN Mayo, Pete Pelt, MD   1 mg at 04/22/18 1042   Or  . LORazepam (ATIVAN) injection 1 mg  1 mg Intramuscular Q6H PRN Mayo, Pete Pelt, MD      . MEDLINE mouth rinse  15 mL Mouth Rinse BID Amelia Jo, MD   15 mL at 04/22/18 0850  . methylPREDNISolone sodium succinate (SOLU-MEDROL) 40 mg/mL injection 40 mg  40 mg Intravenous Q12H Amelia Jo, MD   40 mg at 04/22/18 1723  . ondansetron (ZOFRAN) tablet 4 mg  4 mg Oral Q6H PRN Amelia Jo, MD       Or  . ondansetron [employer]) injection 4 mg  4 mg Intravenous Q6H PRN Amelia Jo, MD   4 mg at 04/22/18 2036  . oxyCODONE (Oxy IR/ROXICODONE) immediate release tablet 5-10 mg  5-10 mg Oral Q6H PRN Mayo, Pete Pelt, MD   10 mg at 04/22/18 1900  . polyethylene glycol (MIRALAX / GLYCOLAX) packet 17 g  17 g Oral BID Lin Landsman, MD   17 g at 04/22/18 0849  . polyethylene glycol powder (GLYCOLAX/MIRALAX) container 255 g  1 Container Oral Once Lin Landsman, MD      . Derrill Memo ON 04/23/2018] sodium phosphate  (FLEET)  7-19 GM/118ML enema 1 enema  1 enema Rectal Daily PRN Lin Landsman, MD      . traZODone (DESYREL) tablet 25 mg  25 mg Oral QHS PRN Amelia Jo, MD        Musculoskeletal: Strength & Muscle Tone: within normal limits Gait & Station: unsteady Patient leans: N/A  Psychiatric Specialty Exam: Physical Exam  Nursing note and vitals reviewed. Constitutional: She appears well-developed.  HENT:  Head: Normocephalic and atraumatic.  Eyes: Pupils are equal, round, and reactive to light. Conjunctivae are normal.  Neck: Normal range of motion.  Cardiovascular: Regular rhythm and normal heart sounds.  Respiratory: Effort normal.  GI: Soft.  Musculoskeletal: Normal range of motion.  Neurological: She is alert.  Skin: Skin is warm and dry. Rash noted.  Psychiatric: Her mood appears anxious. Her speech is tangential. She is agitated. She is not aggressive. Thought content is not paranoid. Cognition and memory are normal. She expresses impulsivity. She expresses suicidal ideation. She expresses no homicidal ideation. She expresses no suicidal plans.    Review of Systems  Constitutional: Positive for malaise/fatigue.  HENT: Negative.   Eyes: Negative.   Respiratory: Negative.   Cardiovascular: Negative.   Gastrointestinal: Negative.   Musculoskeletal: Negative.   Skin: Negative.   Neurological: Negative.   Psychiatric/Behavioral: Positive for depression. Negative for hallucinations, memory loss, substance abuse and suicidal ideas. The patient is nervous/anxious and has insomnia.     Blood pressure 130/76, pulse 87, temperature 98.5 F (36.9 C), temperature source Oral, resp. rate 17, weight 95.6 kg, SpO2 (!) 89 %.Body mass index is 34.02 kg/m.  General Appearance: Casual  Eye Contact:  Fair  Speech:  Normal Rate  Volume:  Normal  Mood:  Anxious  Affect:  Labile  Thought Process:  Coherent  Orientation:  Full (Time, Place, and Person)  Thought Content:  Logical,  Rumination and Tangential  Suicidal Thoughts:  No  Homicidal Thoughts:  No  Memory:  Immediate;   Fair Recent;   Fair Remote;   Fair  Judgement:  Fair  Insight:  Fair  Psychomotor Activity:  Restlessness  Concentration:  Concentration: Fair  Recall:  [employer] of Knowledge:  Fair  Language:  Fair  Akathisia:  No  Handed:  Right  AIMS (if indicated):     Assets:  Desire for Improvement Housing  ADL's:  Impaired  Cognition:  WNL  Sleep:        Treatment Plan Summary: Daily contact with patient to assess and evaluate symptoms and progress in treatment, Medication management and Plan Patient is having a lot of anxiety.  The steroid treatment probably makes it even worse although that is chronic.  She is not psychotic not suicidal and she does have capacity to make decisions.  I think that it would be useful in the short-term to use oral Klonopin rather than IV Ativan for treating anxiety.  I have put in an order for the oral dissolving Klonopin 0.25 mg 3 times a day for anxiety.  I will continue to follow up.  Spent some time talking with her supporting therapy and encouragement.  Disposition: No evidence of imminent risk to self or others at present.   Patient does not meet criteria for psychiatric inpatient admission. Supportive therapy provided about ongoing stressors.  Alethia Berthold, MD 04/22/2018 9:01 PM

## 2018-04-22 NOTE — Progress Notes (Signed)
Chaplain responded to a code blue turned to rapid response. Chaplain maintain a space and silent prayer. Pt awaken and asked for chaplain, Chaplain prayed with Pt and tried to calm anxiety. Chaplain follow up before leaving in the am.    04/22/18 2100  Clinical Encounter Type  Visited With Patient  Visit Type Spiritual support  Referral From Care management  Spiritual Encounters  Spiritual Needs Prayer

## 2018-04-23 LAB — GLUCOSE, CAPILLARY
Glucose-Capillary: 136 mg/dL — ABNORMAL HIGH (ref 70–99)
Glucose-Capillary: 155 mg/dL — ABNORMAL HIGH (ref 70–99)
Glucose-Capillary: 109 mg/dL — ABNORMAL HIGH (ref 70–99)

## 2018-04-23 LAB — BASIC METABOLIC PANEL
Anion gap: 8 (ref 5–15)
BUN: 11 mg/dL (ref 8–23)
CALCIUM: 8.5 mg/dL — AB (ref 8.9–10.3)
CO2: 30 mmol/L (ref 22–32)
CREATININE: 0.76 mg/dL (ref 0.44–1.00)
Chloride: 96 mmol/L — ABNORMAL LOW (ref 98–111)
GFR calc Af Amer: >60 mL/min (ref >60)
Glucose, Bld: 136 mg/dL — ABNORMAL HIGH (ref 70–99)
Potassium: 4.2 mmol/L (ref 3.5–5.1)
SODIUM: 134 mmol/L — AB (ref 135–145)

## 2018-04-23 LAB — CBC
HCT: 27.7 % — ABNORMAL LOW (ref 36.0–46.0)
HEMOGLOBIN: 8 g/dL — AB (ref 12.0–15.0)
MCH: 21.7 pg — AB (ref 26.0–34.0)
MCHC: 28.9 g/dL — AB (ref 30.0–36.0)
MCV: 75.1 fL — ABNORMAL LOW (ref 80.0–100.0)
PLATELETS: 389 10*3/uL (ref 150–400)
RBC: 3.69 MIL/uL — ABNORMAL LOW (ref 3.87–5.11)
RDW: 20.7 % — AB (ref 11.5–15.5)
WBC: 12.8 10*3/uL — ABNORMAL HIGH (ref 4.0–10.5)
nRBC: 0.2 % (ref 0.0–0.2)

## 2018-04-23 LAB — PROTIME-INR
INR: 0.99
Prothrombin Time: 13 seconds (ref 11.4–15.2)

## 2018-04-23 MED ORDER — SODIUM CHLORIDE 0.9 % IV SOLN
510.0000 mg | Freq: Once | INTRAVENOUS | Status: AC
  Administered 2018-04-23: 510 mg via INTRAVENOUS
  Filled 2018-04-23: qty 17

## 2018-04-23 MED ORDER — BISACODYL 10 MG RE SUPP: 10.0000 mg | Freq: Every day | RECTAL | Status: DC | PRN

## 2018-04-23 MED ORDER — POLYETHYLENE GLYCOL 3350 17 GM/SCOOP PO POWD
1.0000 | Freq: Once | ORAL | Status: AC
  Administered 2018-04-23: 255 g via ORAL
  Filled 2018-04-23: qty 255

## 2018-04-23 MED ORDER — FOLIC ACID 1 MG PO TABS
1.0000 mg | ORAL_TABLET | Freq: Every day | ORAL | Status: DC
  Administered 2018-04-24 – 2018-04-28 (×5): 1 mg via ORAL
  Filled 2018-04-23 (×5): qty 1

## 2018-04-23 MED ORDER — SODIUM CHLORIDE 0.9 % IV SOLN
INTRAVENOUS | Status: DC | PRN
  Administered 2018-04-23 – 2018-04-27 (×2): 1000 mL via INTRAVENOUS

## 2018-04-23 NOTE — Progress Notes (Addendum)
[facility] NAME: Christine Nichols    MR#:  [id number]  DATE OF BIRTH:  [birth year]  SUBJECTIVE:  States her shortness of breath is improving.  Still endorsing left flank pain that is worse with coughing.  Denies chest pain  REVIEW OF SYSTEMS:  Review of Systems  Constitutional: Negative for chills and fever.  HENT: Negative for congestion and sore throat.   Eyes: Negative for blurred vision and double vision.  Respiratory: Positive for cough, sputum production, shortness of breath and wheezing.   Cardiovascular: Negative for chest pain, palpitations and leg swelling.  Gastrointestinal: Positive for abdominal pain. Negative for nausea and vomiting.  Genitourinary: Negative for dysuria and frequency.  Musculoskeletal: Positive for back pain. Negative for neck pain.  Neurological: Negative for dizziness and headaches.  Psychiatric/Behavioral: Negative for depression. The patient is nervous/anxious.     DRUG ALLERGIES:   Allergies  Allergen Reactions  . Sulfa Antibiotics Anaphylaxis  . Sulfasalazine Anaphylaxis  . Codeine Nausea Only  . Lavender Oil     Sinus swelling, nausea  . Penicillins Rash    Has patient had a PCN reaction causing immediate rash, facial/tongue/throat swelling, SOB or lightheadedness with hypotension: Yes Has patient had a PCN reaction causing severe rash involving mucus membranes or skin necrosis: No Has patient had a PCN reaction that required hospitalization: No Has patient had a PCN reaction occurring within the last 10 years: No If all of the above answers are "NO", then may proceed with Cephalosporin use.   VITALS:  Blood pressure 135/80, pulse (!) 56, temperature (!) 97.5 F (36.4 C), temperature source Oral, resp. rate 16, weight 96.9 kg, SpO2 100 %. PHYSICAL EXAMINATION:  Physical Exam  GENERAL:  62 year-old patient sitting up in chair, coughing intermittently.  Appears anxious, but more  comfortable today. EYES: Pupils equal, round, reactive to light and accommodation. No scleral icterus. Extraocular muscles intact.  HEENT: Head atraumatic, normocephalic. Oropharynx and nasopharynx clear.  Moist mucous membranes NECK:  Supple, no jugular venous distention. No thyroid enlargement, no tenderness.  LUNGS:  Mild expiratory wheezing throughout all lung fields.  Normal work of breathing. Nasal cannula in place CARDIOVASCULAR: RRR, S1, S2 normal. No S3/S4.  ABDOMEN: There is tenderness with palpation in the left upper quadrant, no guarding/rebound.  Otherwise, abdomen is soft and nondistended. Bowel sounds present. EXTREMITIES: No pedal edema, cyanosis, or clubbing.  NEUROLOGIC: No focal weakness. + Global weakness.  Sensation intact to light touch throughout. PSYCHIATRIC: The patient is alert and oriented x 3.  Appears anxious.  SKIN: No obvious rash, lesion, or ulcer.  LABORATORY PANEL:  Female CBC Recent Labs  Lab 04/23/18 0454  WBC 12.8*  HGB 8.0*  HCT 27.7*  PLT 389   ------------------------------------------------------------------------------------------------------------------ Chemistries  Recent Labs  Lab 04/20/18 1601  04/23/18 0454  NA 134*   < > 134*  K 4.4   < > 4.2  CL 98   < > 96*  CO2 27   < > 30  GLUCOSE 171*   < > 136*  BUN 10   < > 11  CREATININE 1.05*   < > 0.76  CALCIUM 9.0   < > 8.5*  AST 34  --   --   ALT 17  --   --   ALKPHOS 65  --   --   BILITOT 0.7  --   --    < > = values in this interval not displayed.  RADIOLOGY:  Dg Ribs Unilateral Left  Result Date: 04/22/2018 CLINICAL DATA:  Left flank and hip pain with cough and dyspnea. EXAM: LEFT RIBS - 2 VIEW COMPARISON:  None. FINDINGS: Acute displaced left lateral ninth rib fracture without associated pneumothorax. Left basilar atelectasis is noted. Cardiomegaly is noted with aortic atherosclerosis. Numerous chain sutures project over the left hemiabdomen and upper quadrant. 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 The included left shoulder is intact. IMPRESSION: 1. Acute displaced left lateral ninth rib fracture without associated pneumothorax or hemothorax. 2. Cardiomegaly with aortic atherosclerosis. Electronically Signed   By: Ashley Royalty M.D.   On: 04/22/2018 15:05   Dg Hip Unilat With Pelvis 2-3 Views Left  Result Date: 04/22/2018 CLINICAL DATA:  Left flank and left hip pain made worse when coughing. EXAM: DG HIP (WITH OR WITHOUT PELVIS) 2-3V LEFT COMPARISON:  Coronal and sagittal reconstructed images through the pelvis and hips from an abdominal and pelvic CT scan dated April 20, 2018 FINDINGS: The bony pelvis is subjectively adequately mineralized. There is no lytic nor blastic lesion. No acute pelvic fracture is observed. AP and lateral views of the left hip reveal preservation of the joint space. The articular surfaces of the left femoral head and acetabulum remains smoothly rounded. The femoral neck, intertrochanteric, and subtrochanteric regions are normal. IMPRESSION: There is no acute or significant chronic bony abnormality of the left hip. Electronically Signed   By: David  Martinique M.D.   On: 04/22/2018 15:04   ASSESSMENT AND PLAN:   Multifocal community-acquired pneumonia- remains on 2 L of oxygen.   Procalcitonin negative, but CT chest consistent with pneumonia. -Continue ceftriaxone and azithromycin -Can likely transition to p.o. antibiotics tomorrow -Wean oxygen as able  Acute COPD exacerbation-improved -Continue steroids, inhalers, supplemental oxygen -Can likely transition to p.o. prednisone tomorrow  Suspected large bowel mass- has not had a bowel movement.  -Continue full liquid diet -GI following-plan for slow bowel prep and then colonoscopy in the next couple of days -Surgery following-recommend colonoscopy first -Continue nausea and pain meds -Continue IV fluids  Acute left rib fracture- patient with left rib ecchymoses.   Fracture likely related to coughing and chronic steroid use. -Continue IV pain control -Incentive spirometry  Uncontrolled anxiety and depression-patient visibly anxious and tearful. -Psychiatry consult- recommend switching from IV Ativan to po Klonopin  Hypertension- normotensive -Continue home Norvasc -IV hydralazine as needed while patient is NPO  Type 2 diabetes- blood sugars well controlled, but may increase with solumedrol -Sensitive SSI  Microcytic anemia- likely iron deficiency in the setting of colonic mass.  Anemia panel unremarkable. -Monitor  Tobacco abuse.  Smoking cessation was discussed with patient this admission.  All the records are reviewed and case discussed with Care Management/Social Worker. Management plans discussed with the patient, family and they are in agreement.  CODE STATUS: Full Code  TOTAL TIME TAKING CARE OF THIS PATIENT: 40 minutes.   More than 50% of the time was spent in counseling/coordination of care: YES  POSSIBLE D/C unknown, DEPENDING ON CLINICAL CONDITION.   Berna Spare Eyvette Cordon M.D on 04/23/2018 at 2:33 PM  Between 7am to 6pm - Pager [phone]  After 6pm go to [email or web address] - Proofreader  [facility]  Office  [phone]  CC: Primary care physician; System, Pcp Not In  Note: This dictation was prepared with Dragon dictation along with smaller phrase technology. Any transcriptional errors that result from this process are unintentional.

## 2018-04-23 NOTE — Consult Note (Signed)
[facility] Consult   Reason for Consult: Follow-up consult for patient with anxiety Referring Physician: Bobbye Charleston Patient Identification: Christine Nichols MRN:  [id number] Principal Diagnosis: Adjustment disorder with mixed disturbance of emotions and conduct Diagnosis:   Patient Active Problem List   Diagnosis Date Noted  . Adjustment disorder with mixed disturbance of emotions and conduct [F43.25] 04/22/2018  . Large bowel obstruction ([place]) [K56.609] 04/20/2018  . Chronic hepatitis C without hepatic coma ([place]) [B18.2]   . Varicella [B01.9] 10/16/2017    Total Time spent with patient: 20 minutes  Subjective:   Christine Nichols is a 62 y.o. female patient admitted with "I am trying not to be scared".  HPI: Patient seen for follow-up on yesterday's visit.  Patient was with her sister today.  Sitting up in bed.  Able to hold a lucid conversation.  She says that she is feeling a little bit better and that although she is still frightened she is trying to keep her composure so that she can do what she needs to do.  Not reporting any suicidal thoughts does not appear to be agitated or psychotic.  Does not appear to be oversedated from medicine.  Past Psychiatric History: History of anxiety  Risk to Self:   Risk to Others:   Prior Inpatient Therapy:   Prior Outpatient Therapy:    Past Medical History:  Past Medical History:  Diagnosis Date  . Arthritis   . Bullous pemphigoid   . Diabetes mellitus without complication ([place])   . Hypertension   . Lupus ([place])   . Neuropathy     Past Surgical History:  Procedure Laterality Date  . ABDOMINAL HYSTERECTOMY    . ABDOMINAL SURGERY     Family History: No family history on file. Family Psychiatric  History: See previous note Social History:  Social History   Substance and Sexual Activity  Alcohol Use Never  . Frequency: Never     Social History   Substance and Sexual Activity  Drug Use Never    Social History    Socioeconomic History  . Marital status: Married    Spouse name: Not on file  . Number of children: Not on file  . Years of education: Not on file  . Highest education level: Not on file  Occupational History  . Not on file  Social Needs  . Financial resource strain: Not on file  . Food insecurity:    Worry: Not on file    Inability: Not on file  . Transportation needs:    Medical: Not on file    Non-medical: Not on file  Tobacco Use  . Smoking status: Heavy Tobacco Smoker  . Smokeless tobacco: Never Used  Substance and Sexual Activity  . Alcohol use: Never    Frequency: Never  . Drug use: Never  . Sexual activity: Not on file  Lifestyle  . Physical activity:    Days per week: Not on file    Minutes per session: Not on file  . Stress: Not on file  Relationships  . Social connections:    Talks on phone: Not on file    Gets together: Not on file    Attends religious service: Not on file    Active member of club or organization: Not on file    Attends meetings of clubs or organizations: Not on file    Relationship status: Not on file  Other Topics Concern  . Not on file  Social History Narrative  .  Not on file   Additional Social History:    Allergies:   Allergies  Allergen Reactions  . Sulfa Antibiotics Anaphylaxis  . Sulfasalazine Anaphylaxis  . Codeine Nausea Only  . Lavender Oil     Sinus swelling, nausea  . Penicillins Rash    Has patient had a PCN reaction causing immediate rash, facial/tongue/throat swelling, SOB or lightheadedness with hypotension: Yes Has patient had a PCN reaction causing severe rash involving mucus membranes or skin necrosis: No Has patient had a PCN reaction that required hospitalization: No Has patient had a PCN reaction occurring within the last 10 years: No If all of the above answers are "NO", then may proceed with Cephalosporin use.    Labs:  Results for orders placed or performed during the hospital encounter of  04/20/18 (from the past 48 hour(s))  Glucose, capillary     Status: Abnormal   Collection Time: 04/21/18  6:14 PM  Result Value Ref Range   Glucose-Capillary 175 (H) 70 - 99 mg/dL   Comment 1 Notify RN   Vitamin B12     Status: None   Collection Time: 04/21/18  9:49 PM  Result Value Ref Range   Vitamin B-12 543 180 - 914 pg/mL    Comment: (NOTE) This assay is not validated for testing neonatal or myeloproliferative syndrome specimens for Vitamin B12 levels. Performed at [facility], [place] [address], [place], [place] [address]   Folate     Status: None   Collection Time: 04/21/18  9:49 PM  Result Value Ref Range   Folate 23.0 >5.9 ng/mL    Comment: Performed at [facility], [address] [place], [place] [address]  Glucose, capillary     Status: Abnormal   Collection Time: 04/22/18 12:13 AM  Result Value Ref Range   Glucose-Capillary 120 (H) 70 - 99 mg/dL  CBC     Status: Abnormal   Collection Time: 04/22/18  3:50 AM  Result Value Ref Range   WBC 10.7 (H) 4.0 - 10.5 K/uL   RBC 3.97 3.87 - 5.11 MIL/uL   Hemoglobin 8.5 (L) 12.0 - 15.0 g/dL    Comment: Reticulocyte Hemoglobin testing may be clinically indicated, consider ordering this additional test [id number]    HCT 29.8 (L) 36.0 - 46.0 %   MCV 75.1 (L) 80.0 - 100.0 fL   MCH 21.4 (L) 26.0 - 34.0 pg   MCHC 28.5 (L) 30.0 - 36.0 g/dL   RDW 20.7 (H) 11.5 - 15.5 %   Platelets 366 150 - 400 K/uL   nRBC 0.0 0.0 - 0.2 %    Comment: Performed at [facility], [address] [place], [place] [address]  Basic metabolic panel     Status: Abnormal   Collection Time: 04/22/18  3:50 AM  Result Value Ref Range   Sodium 139 135 - 145 mmol/L   Potassium 4.0 3.5 - 5.1 mmol/L   Chloride 100 98 - 111 mmol/L   CO2 30 22 - 32 mmol/L   Glucose, Bld 131 (H) 70 - 99 mg/dL   BUN 12 8 - 23 mg/dL   Creatinine, Ser 0.81 0.44 - 1.00 mg/dL   Calcium 8.4 (L) 8.9 - 10.3 mg/dL   GFR calc non Af Amer >60 >60 mL/min   GFR  calc Af Amer >60 >60 mL/min    Comment: (NOTE) The eGFR has been calculated using the CKD EPI equation. This calculation has not been validated in all clinical situations. eGFR's persistently <  60 mL/min signify possible Chronic Kidney Disease.    Anion gap 9 5 - 15    Comment: Performed at [facility], [address] [place], [place] [address]  Glucose, capillary     Status: Abnormal   Collection Time: 04/22/18  6:20 AM  Result Value Ref Range   Glucose-Capillary 105 (H) 70 - 99 mg/dL  Glucose, capillary     Status: Abnormal   Collection Time: 04/22/18 11:48 AM  Result Value Ref Range   Glucose-Capillary 179 (H) 70 - 99 mg/dL  Glucose, capillary     Status: Abnormal   Collection Time: 04/22/18  6:47 PM  Result Value Ref Range   Glucose-Capillary 202 (H) 70 - 99 mg/dL  Glucose, capillary     Status: Abnormal   Collection Time: 04/22/18  9:30 PM  Result Value Ref Range   Glucose-Capillary 127 (H) 70 - 99 mg/dL  Glucose, capillary     Status: Abnormal   Collection Time: 04/22/18 11:51 PM  Result Value Ref Range   Glucose-Capillary 151 (H) 70 - 99 mg/dL  CBC     Status: Abnormal   Collection Time: 04/23/18  4:54 AM  Result Value Ref Range   WBC 12.8 (H) 4.0 - 10.5 K/uL   RBC 3.69 (L) 3.87 - 5.11 MIL/uL   Hemoglobin 8.0 (L) 12.0 - 15.0 g/dL    Comment: Reticulocyte Hemoglobin testing may be clinically indicated, consider ordering this additional test [id number]    HCT 27.7 (L) 36.0 - 46.0 %   MCV 75.1 (L) 80.0 - 100.0 fL   MCH 21.7 (L) 26.0 - 34.0 pg   MCHC 28.9 (L) 30.0 - 36.0 g/dL   RDW 20.7 (H) 11.5 - 15.5 %   Platelets 389 150 - 400 K/uL   nRBC 0.2 0.0 - 0.2 %    Comment: Performed at [facility], [address] [place], [place] [address]  Basic metabolic panel     Status: Abnormal   Collection Time: 04/23/18  4:54 AM  Result Value Ref Range   Sodium 134 (L) 135 - 145 mmol/L   Potassium 4.2 3.5 - 5.1 mmol/L   Chloride 96 (L) 98 - 111 mmol/L    CO2 30 22 - 32 mmol/L   Glucose, Bld 136 (H) 70 - 99 mg/dL   BUN 11 8 - 23 mg/dL   Creatinine, Ser 0.76 0.44 - 1.00 mg/dL   Calcium 8.5 (L) 8.9 - 10.3 mg/dL   GFR calc non Af Amer >60 >60 mL/min   GFR calc Af Amer >60 >60 mL/min    Comment: (NOTE) The eGFR has been calculated using the CKD EPI equation. This calculation has not been validated in all clinical situations. eGFR's persistently <60 mL/min signify possible Chronic Kidney Disease.    Anion gap 8 5 - 15    Comment: Performed at [facility], [place], [place] [address]  Glucose, capillary     Status: Abnormal   Collection Time: 04/23/18  6:22 AM  Result Value Ref Range   Glucose-Capillary 136 (H) 70 - 99 mg/dL  Glucose, capillary     Status: Abnormal   Collection Time: 04/23/18 11:53 AM  Result Value Ref Range   Glucose-Capillary 155 (H) 70 - 99 mg/dL  Protime-INR     Status: None   Collection Time: 04/23/18  4:40 PM  Result Value Ref Range   Prothrombin Time 13.0 11.4 - 15.2 seconds   INR 0.99     Comment:  Performed at [facility], [place],  [address]  Glucose, capillary     Status: Abnormal   Collection Time: 04/23/18  5:04 PM  Result Value Ref Range   Glucose-Capillary 109 (H) 70 - 99 mg/dL    Current Facility-Administered Medications  Medication Dose Route Frequency Provider Last Rate Last Dose  . 0.9 %  sodium chloride infusion   Intravenous PRN Mayo, Pete Pelt, MD 10 mL/hr at 04/23/18 1724 1,000 mL at 04/23/18 1724  . acetaminophen (TYLENOL) tablet 650 mg  650 mg Oral Q6H PRN Amelia Jo, MD       Or  . acetaminophen (TYLENOL) suppository 650 mg  650 mg Rectal Q6H PRN Amelia Jo, MD      . albuterol (PROVENTIL) (2.5 MG/3ML) 0.083% nebulizer solution 2.5 mg  2.5 mg Nebulization Q4H PRN Hillary Bow, MD   2.5 mg at 04/23/18 0143  . amLODipine (NORVASC) tablet 5 mg  5 mg Oral Daily Mayo, Pete Pelt, MD   5 mg at 04/23/18 1029  . azithromycin  (ZITHROMAX) 500 mg in sodium chloride 0.9 % 250 mL IVPB  500 mg Intravenous Q24H Amelia Jo, MD 250 mL/hr at 04/23/18 0200    . bisacodyl (DULCOLAX) EC tablet 5 mg  5 mg Oral Daily PRN Amelia Jo, MD   5 mg at 04/22/18 0850  . bisacodyl (DULCOLAX) suppository 10 mg  10 mg Rectal Daily PRN Lin Landsman, MD      . cefTRIAXone (ROCEPHIN) 1 g in sodium chloride 0.9 % 100 mL IVPB  1 g Intravenous Q24H Amelia Jo, MD   Stopped at 04/23/18 [phone]  . clonazepam (KLONOPIN) disintegrating tablet 0.25 mg  0.25 mg Oral TID Christopherjame Carnell T, MD   0.25 mg at 04/23/18 1514  . docusate sodium (COLACE) capsule 100 mg  100 mg Oral BID Amelia Jo, MD   100 mg at 04/23/18 1029  . fluconazole (DIFLUCAN) tablet 200 mg  200 mg Oral Daily Mayo, Pete Pelt, MD   200 mg at 04/23/18 1029  . [START ON 71/11/2692] folic acid (FOLVITE) tablet 1 mg  1 mg Oral Daily Mayo, Pete Pelt, MD      . gabapentin (NEURONTIN) capsule 800 mg  800 mg Oral TID Sela Hua, MD   800 mg at 04/23/18 1514  . hydrALAZINE (APRESOLINE) injection 5 mg  5 mg Intravenous Q4H PRN Mayo, Pete Pelt, MD      . HYDROmorphone (DILAUDID) injection 0.5 mg  0.5 mg Intravenous Q2H PRN Mayo, Pete Pelt, MD   0.5 mg at 04/23/18 1514  . hydrOXYzine (ATARAX/VISTARIL) tablet 25 mg  25 mg Oral TID PRN Mayo, Pete Pelt, MD      . insulin aspart (novoLOG) injection 0-9 Units  0-9 Units Subcutaneous Q6H Mayo, Pete Pelt, MD   3 Units at 04/23/18 1235  . LORazepam (ATIVAN) tablet 1 mg  1 mg Oral Q6H PRN Mayo, Pete Pelt, MD   1 mg at 04/22/18 1042   Or  . LORazepam (ATIVAN) injection 1 mg  1 mg Intramuscular Q6H PRN Mayo, Pete Pelt, MD      . MEDLINE mouth rinse  15 mL Mouth Rinse BID Amelia Jo, MD   15 mL at 04/23/18 1030  . methylPREDNISolone sodium succinate (SOLU-MEDROL) 40 mg/mL injection 40 mg  40 mg Intravenous Q12H Amelia Jo, MD   40 mg at 04/23/18 1715  . ondansetron (ZOFRAN) tablet 4 mg  4 mg Oral Q6H PRN Amelia Jo, MD  Or  .  ondansetron (ZOFRAN) injection 4 mg  4 mg Intravenous Q6H PRN Amelia Jo, MD   4 mg at 04/22/18 2036  . oxyCODONE (Oxy IR/ROXICODONE) immediate release tablet 5-10 mg  5-10 mg Oral Q6H PRN Mayo, Pete Pelt, MD   10 mg at 04/22/18 1900  . sodium phosphate (FLEET) 7-19 GM/118ML enema 1 enema  1 enema Rectal Daily PRN Lin Landsman, MD      . traZODone (DESYREL) tablet 25 mg  25 mg Oral QHS PRN Amelia Jo, MD        Musculoskeletal: Strength & Muscle Tone: within normal limits Gait & Station: normal Patient leans: N/A  Psychiatric Specialty Exam: Physical Exam  Nursing note and vitals reviewed. Constitutional: She appears well-developed and well-nourished.  HENT:  Head: Normocephalic and atraumatic.  Eyes: Pupils are equal, round, and reactive to light. Conjunctivae are normal.  Neck: Normal range of motion.  Cardiovascular: Regular rhythm and normal heart sounds.  Respiratory: Effort normal.  GI: Soft.  Musculoskeletal: Normal range of motion.  Neurological: She is alert.  Skin: Skin is warm and dry.  Psychiatric: Her speech is normal and behavior is normal. Judgment and thought content normal. Her mood appears anxious. Cognition and memory are normal.    Review of Systems  Constitutional: Negative.   HENT: Negative.   Eyes: Negative.   Respiratory: Negative.   Cardiovascular: Negative.   Gastrointestinal: Negative.   Musculoskeletal: Negative.   Skin: Negative.   Neurological: Negative.   Psychiatric/Behavioral: Negative for depression, hallucinations, substance abuse and suicidal ideas. The patient is nervous/anxious.     Blood pressure 140/75, pulse 67, temperature 97.7 F (36.5 C), temperature source Oral, resp. rate 18, weight 96.9 kg, SpO2 100 %.Body mass index is 34.48 kg/m.  General Appearance: Fairly Groomed  Eye Contact:  Good  Speech:  Clear and Coherent  Volume:  Normal  Mood:  Anxious  Affect:  Appropriate  Thought Process:  Goal Directed   Orientation:  Full (Time, Place, and Person)  Thought Content:  Logical  Suicidal Thoughts:  No  Homicidal Thoughts:  No  Memory:  Immediate;   Fair Recent;   Fair Remote;   Fair  Judgement:  Fair  Insight:  Fair  Psychomotor Activity:  Normal  Concentration:  Concentration: Fair  Recall:  [employer] of Knowledge:  Fair  Language:  Fair  Akathisia:  No  Handed:  Right  AIMS (if indicated):     Assets:  Desire for Improvement Housing  ADL's:  Impaired  Cognition:  WNL  Sleep:        Treatment Plan Summary: Medication management and Plan Continue Klonopin half milligram 3 times a day.  No change to medicine.  Supportive therapy and encouragement psychoeducation.  Disposition: No evidence of imminent risk to self or others at present.   Patient does not meet criteria for psychiatric inpatient admission.  Alethia Berthold, MD 04/23/2018 5:43 PM

## 2018-04-23 NOTE — Progress Notes (Addendum)
Christine Darby, MD [address]  [place], [place] [address]  Main: [phone]  Fax: [phone] Pager: [phone]   Subjective: Sitting in chair today, appears to be a little, and relaxed today.  She would like to try some solid food.  She reports passing gas but did not have bowel movement yet.  She has been taking magnesium citrate and MiraLAX.  She has a large bruise in her left flank left lateral lower rib cage areas   Objective: Vital signs in last 24 hours: Vitals:   04/22/18 2314 04/23/18 0619 04/23/18 0620 04/23/18 1513  BP: (!) 145/84  135/80 140/75  Pulse: 62 (!) 58 (!) 56 67  Resp:   16 18  Temp:   (!) 97.5 F (36.4 C) 97.7 F (36.5 C)  TempSrc:   Oral Oral  SpO2: 98% 98% 100% 100%  Weight:   96.9 kg    Weight change: 1.3 kg  Intake/Output Summary (Last 24 hours) at 04/23/2018 1620 Last data filed at 04/23/2018 1514 Gross per 24 hour  Intake 1180.44 ml  Output 2075 ml  Net -894.56 ml     Exam: Heart:: Regular rate and rhythm or S1S2 present Lungs: normal and clear to auscultation Abdomen: soft, distended, tympanic, tender in left upper quadrant Large bruise, ecchymosis in left lateral lower thoracic as well as left flank areas   Lab Results: CBC Latest Ref Rng & Units 04/23/2018 04/22/2018 04/21/2018  WBC 4.0 - 10.5 K/uL 12.8(H) 10.7(H) 11.3(H)  Hemoglobin 12.0 - 15.0 g/dL 8.0(L) 8.5(L) 8.7(L)  Hematocrit 36.0 - 46.0 % 27.7(L) 29.8(L) 31.8(L)  Platelets 150 - 400 K/uL 389 366 350   CMP Latest Ref Rng & Units 04/23/2018 04/22/2018 04/21/2018  Glucose 70 - 99 mg/dL 136(H) 131(H) 113(H)  BUN 8 - 23 mg/dL 11 12 10   Creatinine 0.44 - 1.00 mg/dL 0.76 0.81 0.96  Sodium 135 - 145 mmol/L 134(L) 139 138  Potassium 3.5 - 5.1 mmol/L 4.2 4.0 3.9  Chloride 98 - 111 mmol/L 96(L) 100 101  CO2 22 - 32 mmol/L 30 30 30   Calcium 8.9 - 10.3 mg/dL 8.5(L) 8.4(L) 8.6(L)  Total Protein 6.5 - 8.1 g/dL - - -  Total Bilirubin 0.3 - 1.2 mg/dL - - -   Alkaline Phos 38 - 126 U/L - - -  AST 15 - 41 U/L - - -  ALT 0 - 44 U/L - - -   Micro Results: No results found for this or any previous visit (from the past 240 hour(s)). Studies/Results: Dg Ribs Unilateral Left  Result Date: 04/22/2018 CLINICAL DATA:  Left flank and hip pain with cough and dyspnea. EXAM: LEFT RIBS - 2 VIEW COMPARISON:  None. FINDINGS: Acute displaced left lateral ninth rib fracture without associated pneumothorax. Left basilar atelectasis is noted. Cardiomegaly is noted with aortic atherosclerosis. Numerous chain sutures project over the left hemiabdomen and upper quadrant. Facet arthrosis of the included lower cervical spine. The included left shoulder is intact. IMPRESSION: 1. Acute displaced left lateral ninth rib fracture without associated pneumothorax or hemothorax. 2. Cardiomegaly with aortic atherosclerosis. Electronically Signed   By: Christine Nichols M.D.   On: 04/22/2018 15:05   Dg Hip Unilat With Pelvis 2-3 Views Left  Result Date: 04/22/2018 CLINICAL DATA:  Left flank and left hip pain made worse when coughing. EXAM: DG HIP (WITH OR WITHOUT PELVIS) 2-3V LEFT COMPARISON:  Coronal and sagittal reconstructed images through the pelvis and hips from an abdominal and pelvic  CT scan dated April 20, 2018 FINDINGS: The bony pelvis is subjectively adequately mineralized. There is no lytic nor blastic lesion. No acute pelvic fracture is observed. AP and lateral views of the left hip reveal preservation of the joint space. The articular surfaces of the left femoral head and acetabulum remains smoothly rounded. The femoral neck, intertrochanteric, and subtrochanteric regions are normal. IMPRESSION: There is no acute or significant chronic bony abnormality of the left hip. Electronically Signed   By: Christine  Nichols M.D.   On: 04/22/2018 15:04   Medications: I have reviewed the patient's current medications. Scheduled Meds: . amLODipine  5 mg Oral Daily  . clonazepam  0.25 mg Oral  TID  . docusate sodium  100 mg Oral BID  . fluconazole  200 mg Oral Daily  . [START ON 90/38/3338] folic acid  1 mg Oral Daily  . gabapentin  800 mg Oral TID  . insulin aspart  0-9 Units Subcutaneous Q6H  . mouth rinse  15 mL Mouth Rinse BID  . methylPREDNISolone (SOLU-MEDROL) injection  40 mg Intravenous Q12H  . polyethylene glycol powder  1 Container Oral Once   Continuous Infusions: . azithromycin 250 mL/hr at 04/23/18 0200  . cefTRIAXone (ROCEPHIN)  IV Stopped (04/23/18 0039)  . ferumoxytol     PRN Meds:.acetaminophen **OR** acetaminophen, albuterol, bisacodyl, bisacodyl, hydrALAZINE, HYDROmorphone (DILAUDID) injection, hydrOXYzine, LORazepam **OR** LORazepam, ondansetron **OR** ondansetron (ZOFRAN) IV, oxyCODONE, sodium phosphate, traZODone   Assessment: Principal Problem:   Adjustment disorder with mixed disturbance of emotions and conduct Active Problems:   Large bowel obstruction (HCC)  Christine Nichols is a 62 y.o. female with history of Roux-en-Y gastric bypass, chronic hep C, status post treatment with confirmed eradication, no cirrhosis, bullous pemphigoid admitted with 1 week history of worsening left upper quadrant abdominal pain and distention, constipation, CT revealed probable colonic obstruction at the splenic flexure secondary to underlying mass or severe constipation.  Currently, she is also treated for pneumonia and COPD exacerbation based on the CT chest Patient is evaluated by general surgery who did not recommend surgical intervention at this time. 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

## 2018-04-24 LAB — GLUCOSE, CAPILLARY
GLUCOSE-CAPILLARY: 114 mg/dL — AB (ref 70–99)
GLUCOSE-CAPILLARY: 138 mg/dL — AB (ref 70–99)
GLUCOSE-CAPILLARY: 171 mg/dL — AB (ref 70–99)
GLUCOSE-CAPILLARY: 175 mg/dL — AB (ref 70–99)
Glucose-Capillary: 180 mg/dL — ABNORMAL HIGH (ref 70–99)
Glucose-Capillary: 194 mg/dL — ABNORMAL HIGH (ref 70–99)

## 2018-04-24 LAB — CBC
HEMATOCRIT: 29.9 % — AB (ref 36.0–46.0)
HEMOGLOBIN: 8.7 g/dL — AB (ref 12.0–15.0)
MCH: 21.9 pg — ABNORMAL LOW (ref 26.0–34.0)
MCHC: 29.1 g/dL — ABNORMAL LOW (ref 30.0–36.0)
MCV: 75.1 fL — ABNORMAL LOW (ref 80.0–100.0)
NRBC: 0.4 % — AB (ref 0.0–0.2)
Platelets: 428 10*3/uL — ABNORMAL HIGH (ref 150–400)
RBC: 3.98 MIL/uL (ref 3.87–5.11)
RDW: 21.1 % — ABNORMAL HIGH (ref 11.5–15.5)
WBC: 13.4 10*3/uL — ABNORMAL HIGH (ref 4.0–10.5)

## 2018-04-24 LAB — BASIC METABOLIC PANEL
ANION GAP: 11 (ref 5–15)
BUN: 11 mg/dL (ref 8–23)
CHLORIDE: 96 mmol/L — AB (ref 98–111)
CO2: 28 mmol/L (ref 22–32)
Calcium: 9 mg/dL (ref 8.9–10.3)
Creatinine, Ser: 0.76 mg/dL (ref 0.44–1.00)
GFR calc non Af Amer: >60 mL/min (ref >60)
Glucose, Bld: 141 mg/dL — ABNORMAL HIGH (ref 70–99)
Potassium: 4.3 mmol/L (ref 3.5–5.1)
SODIUM: 135 mmol/L (ref 135–145)

## 2018-04-24 LAB — MAGNESIUM: Magnesium: 2.4 mg/dL (ref 1.7–2.4)

## 2018-04-24 LAB — ZINC: Zinc: 59 ug/dL (ref 56–134)

## 2018-04-24 MED ORDER — BENZONATATE 100 MG PO CAPS: 200.0000 mg | ORAL_CAPSULE | Freq: Three times a day (TID) | ORAL | Status: DC | PRN

## 2018-04-24 MED ORDER — PEG 3350-KCL-NA BICARB-NACL 420 G PO SOLR
4000.0000 mL | Freq: Once | ORAL | Status: AC
  Administered 2018-04-24: 4000 mL via ORAL
  Filled 2018-04-24: qty 4000

## 2018-04-24 MED ORDER — HYDROMORPHONE HCL 1 MG/ML IJ SOLN
1.0000 mg | INTRAMUSCULAR | Status: DC | PRN
  Administered 2018-04-24 – 2018-04-27 (×10): 1 mg via INTRAVENOUS
  Filled 2018-04-24 (×11): qty 1

## 2018-04-24 MED ORDER — CITALOPRAM HYDROBROMIDE 20 MG PO TABS
10.0000 mg | ORAL_TABLET | Freq: Every day | ORAL | Status: DC
  Administered 2018-04-24 – 2018-04-28 (×5): 10 mg via ORAL
  Filled 2018-04-24 (×5): qty 1

## 2018-04-24 MED ORDER — PREDNISONE 20 MG PO TABS: 60.0000 mg | ORAL_TABLET | Freq: Every day | ORAL | Status: DC

## 2018-04-24 MED ORDER — PREMIER PROTEIN SHAKE
11.0000 [oz_av] | Freq: Two times a day (BID) | ORAL | Status: DC
  Administered 2018-04-24 – 2018-04-28 (×3): 11 [oz_av] via ORAL

## 2018-04-24 MED ORDER — GUAIFENESIN-DM 100-10 MG/5ML PO SYRP: 5.0000 mL | ORAL_SOLUTION | ORAL | Status: DC | PRN

## 2018-04-24 MED ORDER — SODIUM CHLORIDE 0.9 % IV SOLN
300.0000 mg | Freq: Once | INTRAVENOUS | Status: AC
  Administered 2018-04-24: 300 mg via INTRAVENOUS
  Filled 2018-04-24: qty 15

## 2018-04-24 MED ORDER — SODIUM CHLORIDE 0.9 % IV SOLN
INTRAVENOUS | Status: DC
  Administered 2018-04-24 – 2018-04-25 (×2): via INTRAVENOUS

## 2018-04-24 MED ORDER — ADULT MULTIVITAMIN W/MINERALS CH
1.0000 | ORAL_TABLET | Freq: Every day | ORAL | Status: DC
  Administered 2018-04-24 – 2018-04-28 (×5): 1 via ORAL
  Filled 2018-04-24 (×5): qty 1

## 2018-04-24 NOTE — Progress Notes (Signed)
[facility] NAME: Christine Nichols    MR#:  [id number]  DATE OF BIRTH:  Dec 25, 1955  SUBJECTIVE:  States she is still having a lot of chest pain when she coughs.  Shortness of breath has improved.  REVIEW OF SYSTEMS:  Review of Systems  Constitutional: Negative for chills and fever.  HENT: Negative for congestion and sore throat.   Eyes: Negative for blurred vision and double vision.  Respiratory: Positive for cough, sputum production, shortness of breath and wheezing.   Cardiovascular: Negative for chest pain, palpitations and leg swelling.  Gastrointestinal: Positive for abdominal pain. Negative for nausea and vomiting.  Genitourinary: Negative for dysuria and frequency.  Musculoskeletal: Positive for back pain. Negative for neck pain.  Neurological: Negative for dizziness and headaches.  Psychiatric/Behavioral: Negative for depression. The patient is nervous/anxious.     DRUG ALLERGIES:   Allergies  Allergen Reactions  . Sulfa Antibiotics Anaphylaxis  . Sulfasalazine Anaphylaxis  . Codeine Nausea Only  . Lavender Oil     Sinus swelling, nausea  . Penicillins Rash    Has patient had a PCN reaction causing immediate rash, facial/tongue/throat swelling, SOB or lightheadedness with hypotension: Yes Has patient had a PCN reaction causing severe rash involving mucus membranes or skin necrosis: No Has patient had a PCN reaction that required hospitalization: No Has patient had a PCN reaction occurring within the last 10 years: No If all of the above answers are "NO", then may proceed with Cephalosporin use.   VITALS:  Blood pressure 139/84, pulse 79, temperature 97.7 F (36.5 C), temperature source Oral, resp. rate 16, weight 97.6 kg, SpO2 96 %. PHYSICAL EXAMINATION:  Physical Exam  GENERAL:  62 year-old patient sitting up in chair, coughing intermittently.  Appears anxious, but more comfortable today. EYES: Pupils equal,  round, reactive to light and accommodation. No scleral icterus. Extraocular muscles intact.  HEENT: Head atraumatic, normocephalic. Oropharynx and nasopharynx clear.  Moist mucous membranes NECK:  Supple, no jugular venous distention. No thyroid enlargement, no tenderness.  LUNGS:  Mild expiratory wheezing throughout all lung fields, improved from previous exam.  Normal work of breathing. Nasal cannula in place CARDIOVASCULAR: RRR, S1, S2 normal. No S3/S4.  ABDOMEN: There is tenderness with palpation in the left upper quadrant with large amount of bruising over the left flank, no guarding/rebound.  Otherwise, abdomen is soft and nondistended. Bowel sounds present. EXTREMITIES: No pedal edema, cyanosis, or clubbing.  NEUROLOGIC: No focal weakness. + Global weakness.  Sensation intact to light touch throughout. PSYCHIATRIC: The patient is alert and oriented x 3.  Appears anxious.  SKIN: No obvious rash, lesion, or ulcer.  LABORATORY PANEL:  Female CBC Recent Labs  Lab 04/24/18 0406  WBC 13.4*  HGB 8.7*  HCT 29.9*  PLT 428*   ------------------------------------------------------------------------------------------------------------------ Chemistries  Recent Labs  Lab 04/20/18 1601  04/24/18 0406  NA 134*   < > 135  K 4.4   < > 4.3  CL 98   < > 96*  CO2 27   < > 28  GLUCOSE 171*   < > 141*  BUN 10   < > 11  CREATININE 1.05*   < > 0.76  CALCIUM 9.0   < > 9.0  AST 34  --   --   ALT 17  --   --   ALKPHOS 65  --   --   BILITOT 0.7  --   --    < > =  values in this interval not displayed.   RADIOLOGY:  No results found. ASSESSMENT AND PLAN:   Multifocal community-acquired pneumonia- remains on 2 L of oxygen.   Procalcitonin negative, but CT chest consistent with pneumonia. -Continue ceftriaxone and azithromycin total of 5 days -Wean oxygen as able  Acute COPD exacerbation-improved -Continue steroids, inhalers, supplemental oxygen -Continue IV Solu-Medrol for a total of 5 days,  will then place patient back on her home prednisone dose  Suspected large bowel mass-has had multiple bowel movements with bowel prep -Continue full liquid diet -GI following-currently doing bowel prep -Surgery following-recommend colonoscopy first before consideration of surgery -Continue nausea and pain meds -Continue IV fluids  Acute left rib fracture- patient with left rib ecchymoses.  Fracture likely related to coughing and chronic steroid use. -Continue IV pain control -Incentive spirometry  Uncontrolled anxiety and depression-patient continues to be very anxious regarding possible colon mass -Psychiatry consult-recommend continuing Klonopin  Hypertension- normotensive -Continue home Norvasc -IV hydralazine as needed  Type 2 diabetes- blood sugars well controlled. -Sensitive SSI  Microcytic anemia- likely iron deficiency in the setting of colonic mass.  Anemia panel unremarkable. -Monitor  Tobacco abuse.  Smoking cessation was discussed with patient this admission.  Bullous pemphigoid-follows with outpatient dermatology -Will continue home chronic prednisone after completion of 5-day Solu-Medrol course  All the records are reviewed and case discussed with Care Management/Social Worker. Management plans discussed with the patient, family and they are in agreement.  CODE STATUS: Full Code  TOTAL TIME TAKING CARE OF THIS PATIENT: 40 minutes.   More than 50% of the time was spent in counseling/coordination of care: YES  POSSIBLE D/C unknown, DEPENDING ON CLINICAL CONDITION.   Berna Spare Mayo M.D on 04/24/2018 at 2:29 PM  Between 7am to 6pm - Pager [phone]  After 6pm go to [email or web address] - Proofreader  [facility]  Office  [phone]  CC: Primary care physician; System, Pcp Not In  Note: This dictation was prepared with Dragon dictation along with smaller phrase technology. Any transcriptional errors that result from this  process are unintentional.

## 2018-04-24 NOTE — Consult Note (Signed)
[facility] Consult   Reason for Consult: Follow-up consult for patient with anxiety around her medical problems Referring Physician: Gouru Patient Identification: EVALYNE CORTOPASSI MRN:  [id number] Principal Diagnosis: Adjustment disorder with mixed disturbance of emotions and conduct Diagnosis:   Patient Active Problem List   Diagnosis Date Noted  . Adjustment disorder with mixed disturbance of emotions and conduct [F43.25] 04/22/2018  . Large bowel obstruction ([place]) [K56.609] 04/20/2018  . Chronic hepatitis C without hepatic coma ([place]) [B18.2]   . Varicella [B01.9] 10/16/2017    Total Time spent with patient: 30 minutes  Subjective:   NARJIS MIRA is a 62 y.o. female patient admitted with "I feel worthless".  HPI: Patient seen chart reviewed.  Patient today still is working on trying to do enough prep to allow for a colonoscopy.  She tells me that this morning she thinks that she was behaving badly.  She feels embarrassed about it.  He is struggling with feeling very negative and hopeless chronically but denies any suicidal intent or plan.  At least during the time I was with her she was not nearly as agitated as she had been a couple days ago.  Past Psychiatric History: Long-standing depression and anxiety  Risk to Self:   Risk to Others:   Prior Inpatient Therapy:   Prior Outpatient Therapy:    Past Medical History:  Past Medical History:  Diagnosis Date  . Arthritis   . Bullous pemphigoid   . Diabetes mellitus without complication ([place])   . Hypertension   . Lupus ([place])   . Neuropathy     Past Surgical History:  Procedure Laterality Date  . ABDOMINAL HYSTERECTOMY    . ABDOMINAL SURGERY     Family History: No family history on file. Family Psychiatric  History: See previous note Social History:  Social History   Substance and Sexual Activity  Alcohol Use Never  . Frequency: Never     Social History   Substance and Sexual Activity  Drug  Use Never    Social History   Socioeconomic History  . Marital status: Married    Spouse name: Not on file  . Number of children: Not on file  . Years of education: Not on file  . Highest education level: Not on file  Occupational History  . Not on file  Social Needs  . Financial resource strain: Not on file  . Food insecurity:    Worry: Not on file    Inability: Not on file  . Transportation needs:    Medical: Not on file    Non-medical: Not on file  Tobacco Use  . Smoking status: Heavy Tobacco Smoker  . Smokeless tobacco: Never Used  Substance and Sexual Activity  . Alcohol use: Never    Frequency: Never  . Drug use: Never  . Sexual activity: Not on file  Lifestyle  . Physical activity:    Days per week: Not on file    Minutes per session: Not on file  . Stress: Not on file  Relationships  . Social connections:    Talks on phone: Not on file    Gets together: Not on file    Attends religious service: Not on file    Active member of club or organization: Not on file    Attends meetings of clubs or organizations: Not on file    Relationship status: Not on file  Other Topics Concern  . Not on file  Social History Narrative  .  Not on file   Additional Social History:    Allergies:   Allergies  Allergen Reactions  . Sulfa Antibiotics Anaphylaxis  . Sulfasalazine Anaphylaxis  . Codeine Nausea Only  . Lavender Oil     Sinus swelling, nausea  . Penicillins Rash    Has patient had a PCN reaction causing immediate rash, facial/tongue/throat swelling, SOB or lightheadedness with hypotension: Yes Has patient had a PCN reaction causing severe rash involving mucus membranes or skin necrosis: No Has patient had a PCN reaction that required hospitalization: No Has patient had a PCN reaction occurring within the last 10 years: No If all of the above answers are "NO", then may proceed with Cephalosporin use.    Labs:  Results for orders placed or performed during the  hospital encounter of 04/20/18 (from the past 48 hour(s))  Glucose, capillary     Status: Abnormal   Collection Time: 04/22/18  6:47 PM  Result Value Ref Range   Glucose-Capillary 202 (H) 70 - 99 mg/dL  Glucose, capillary     Status: Abnormal   Collection Time: 04/22/18  9:30 PM  Result Value Ref Range   Glucose-Capillary 127 (H) 70 - 99 mg/dL  Glucose, capillary     Status: Abnormal   Collection Time: 04/22/18 11:51 PM  Result Value Ref Range   Glucose-Capillary 151 (H) 70 - 99 mg/dL  CBC     Status: Abnormal   Collection Time: 04/23/18  4:54 AM  Result Value Ref Range   WBC 12.8 (H) 4.0 - 10.5 K/uL   RBC 3.69 (L) 3.87 - 5.11 MIL/uL   Hemoglobin 8.0 (L) 12.0 - 15.0 g/dL    Comment: Reticulocyte Hemoglobin testing may be clinically indicated, consider ordering this additional test [id number]    HCT 27.7 (L) 36.0 - 46.0 %   MCV 75.1 (L) 80.0 - 100.0 fL   MCH 21.7 (L) 26.0 - 34.0 pg   MCHC 28.9 (L) 30.0 - 36.0 g/dL   RDW 20.7 (H) 11.5 - 15.5 %   Platelets 389 150 - 400 K/uL   nRBC 0.2 0.0 - 0.2 %    Comment: Performed at [facility], [address] [place], [place] [address]  Basic metabolic panel     Status: Abnormal   Collection Time: 04/23/18  4:54 AM  Result Value Ref Range   Sodium 134 (L) 135 - 145 mmol/L   Potassium 4.2 3.5 - 5.1 mmol/L   Chloride 96 (L) 98 - 111 mmol/L   CO2 30 22 - 32 mmol/L   Glucose, Bld 136 (H) 70 - 99 mg/dL   BUN 11 8 - 23 mg/dL   Creatinine, Ser 0.76 0.44 - 1.00 mg/dL   Calcium 8.5 (L) 8.9 - 10.3 mg/dL   GFR calc non Af Amer >60 >60 mL/min   GFR calc Af Amer >60 >60 mL/min    Comment: (NOTE) The eGFR has been calculated using the CKD EPI equation. This calculation has not been validated in all clinical situations. eGFR's persistently <60 mL/min signify possible Chronic Kidney Disease.    Anion gap 8 5 - 15    Comment: Performed at [facility], [address] [place], [place] [address]  Glucose, capillary      Status: Abnormal   Collection Time: 04/23/18  6:22 AM  Result Value Ref Range   Glucose-Capillary 136 (H) 70 - 99 mg/dL  Glucose, capillary     Status: Abnormal   Collection Time: 04/23/18 11:53 AM  Result Value  Ref Range   Glucose-Capillary 155 (H) 70 - 99 mg/dL  Protime-INR     Status: None   Collection Time: 04/23/18  4:40 PM  Result Value Ref Range   Prothrombin Time 13.0 11.4 - 15.2 seconds   INR 0.99     Comment: Performed at [facility], [address] [place], [place] [address]  Glucose, capillary     Status: Abnormal   Collection Time: 04/23/18  5:04 PM  Result Value Ref Range   Glucose-Capillary 109 (H) 70 - 99 mg/dL  Glucose, capillary     Status: Abnormal   Collection Time: 04/24/18 12:25 AM  Result Value Ref Range   Glucose-Capillary 171 (H) 70 - 99 mg/dL  CBC     Status: Abnormal   Collection Time: 04/24/18  4:06 AM  Result Value Ref Range   WBC 13.4 (H) 4.0 - 10.5 K/uL   RBC 3.98 3.87 - 5.11 MIL/uL   Hemoglobin 8.7 (L) 12.0 - 15.0 g/dL    Comment: Reticulocyte Hemoglobin testing may be clinically indicated, consider ordering this additional test [id number]    HCT 29.9 (L) 36.0 - 46.0 %   MCV 75.1 (L) 80.0 - 100.0 fL   MCH 21.9 (L) 26.0 - 34.0 pg   MCHC 29.1 (L) 30.0 - 36.0 g/dL   RDW 21.1 (H) 11.5 - 15.5 %   Platelets 428 (H) 150 - 400 K/uL   nRBC 0.4 (H) 0.0 - 0.2 %    Comment: Performed at [facility], [address] [place], [place] [address]  Basic metabolic panel     Status: Abnormal   Collection Time: 04/24/18  4:06 AM  Result Value Ref Range   Sodium 135 135 - 145 mmol/L   Potassium 4.3 3.5 - 5.1 mmol/L   Chloride 96 (L) 98 - 111 mmol/L   CO2 28 22 - 32 mmol/L   Glucose, Bld 141 (H) 70 - 99 mg/dL   BUN 11 8 - 23 mg/dL   Creatinine, Ser 0.76 0.44 - 1.00 mg/dL   Calcium 9.0 8.9 - 10.3 mg/dL   GFR calc non Af Amer >60 >60 mL/min   GFR calc Af Amer >60 >60 mL/min    Comment: (NOTE) The eGFR has been calculated using the  CKD EPI equation. This calculation has not been validated in all clinical situations. eGFR's persistently <60 mL/min signify possible Chronic Kidney Disease.    Anion gap 11 5 - 15    Comment: Performed at [facility], [address] [place], [place] [address]  Glucose, capillary     Status: Abnormal   Collection Time: 04/24/18  5:37 AM  Result Value Ref Range   Glucose-Capillary 138 (H) 70 - 99 mg/dL  Glucose, capillary     Status: Abnormal   Collection Time: 04/24/18  8:11 AM  Result Value Ref Range   Glucose-Capillary 114 (H) 70 - 99 mg/dL   Comment 1 Notify RN   Glucose, capillary     Status: Abnormal   Collection Time: 04/24/18 12:57 PM  Result Value Ref Range   Glucose-Capillary 175 (H) 70 - 99 mg/dL   Comment 1 Notify RN     Current Facility-Administered Medications  Medication Dose Route Frequency Provider Last Rate Last Dose  . 0.9 %  sodium chloride infusion   Intravenous PRN Mayo, Pete Pelt, MD   Stopped at 04/24/18 4818  . acetaminophen (TYLENOL) tablet 650 mg  650 mg Oral Q6H PRN Amelia Jo, MD   [phone]  mg at 04/23/18 1928   Or  . acetaminophen (TYLENOL) suppository 650 mg  650 mg Rectal Q6H PRN Amelia Jo, MD      . albuterol (PROVENTIL) (2.5 MG/3ML) 0.083% nebulizer solution 2.5 mg  2.5 mg Nebulization Q4H PRN Hillary Bow, MD   2.5 mg at 04/23/18 0143  . amLODipine (NORVASC) tablet 5 mg  5 mg Oral Daily Mayo, Pete Pelt, MD   5 mg at 04/24/18 0900  . azithromycin (ZITHROMAX) 500 mg in sodium chloride 0.9 % 250 mL IVPB  500 mg Intravenous Q24H Sela Hua, MD 250 mL/hr at 04/24/18 0227 500 mg at 04/24/18 0227  . benzonatate (TESSALON) capsule 200 mg  200 mg Oral TID PRN Mayo, Pete Pelt, MD      . bisacodyl (DULCOLAX) EC tablet 5 mg  5 mg Oral Daily PRN Amelia Jo, MD   5 mg at 04/22/18 0850  . bisacodyl (DULCOLAX) suppository 10 mg  10 mg Rectal Daily PRN Lin Landsman, MD      . cefTRIAXone (ROCEPHIN) 1 g in sodium chloride 0.9 % 100 mL  IVPB  1 g Intravenous Q24H Sela Hua, MD 200 mL/hr at 04/24/18 0108    . clonazepam (KLONOPIN) disintegrating tablet 0.25 mg  0.25 mg Oral TID Shiquan Mathieu, Madie Reno, MD   0.25 mg at 04/24/18 1619  . docusate sodium (COLACE) capsule 100 mg  100 mg Oral BID Amelia Jo, MD   100 mg at 04/24/18 0901  . fluconazole (DIFLUCAN) tablet 200 mg  200 mg Oral Daily Mayo, Pete Pelt, MD   200 mg at 04/24/18 0900  . folic acid (FOLVITE) tablet 1 mg  1 mg Oral Daily Mayo, Pete Pelt, MD   1 mg at 04/24/18 0901  . gabapentin (NEURONTIN) capsule 800 mg  800 mg Oral TID Sela Hua, MD   800 mg at 04/24/18 1619  . guaiFENesin-dextromethorphan (ROBITUSSIN DM) 100-10 MG/5ML syrup 5 mL  5 mL Oral Q4H PRN Mayo, Pete Pelt, MD      . hydrALAZINE (APRESOLINE) injection 5 mg  5 mg Intravenous Q4H PRN Mayo, Pete Pelt, MD      . HYDROmorphone (DILAUDID) injection 1 mg  1 mg Intravenous Q3H PRN Mayo, Pete Pelt, MD   1 mg at 04/24/18 1432  . hydrOXYzine (ATARAX/VISTARIL) tablet 25 mg  25 mg Oral TID PRN Mayo, Pete Pelt, MD      . insulin aspart (novoLOG) injection 0-9 Units  0-9 Units Subcutaneous Q6H Mayo, Pete Pelt, MD   1 Units at 04/24/18 [phone]  . LORazepam (ATIVAN) tablet 1 mg  1 mg Oral Q6H PRN Mayo, Pete Pelt, MD   1 mg at 04/22/18 1042   Or  . LORazepam (ATIVAN) injection 1 mg  1 mg Intramuscular Q6H PRN Mayo, Pete Pelt, MD      . MEDLINE mouth rinse  15 mL Mouth Rinse BID Amelia Jo, MD   15 mL at 04/24/18 0901  . methylPREDNISolone sodium succinate (SOLU-MEDROL) 40 mg/mL injection 40 mg  40 mg Intravenous Q12H Mayo, Pete Pelt, MD   40 mg at 04/24/18 1619  . multivitamin with minerals tablet 1 tablet  1 tablet Oral Daily Mayo, Pete Pelt, MD   1 tablet at 04/24/18 1123  . ondansetron (ZOFRAN) tablet 4 mg  4 mg Oral Q6H PRN Amelia Jo, MD       Or  . ondansetron [facility]) injection 4 mg  4 mg Intravenous Q6H PRN Amelia Jo, MD   4  mg at 04/22/18 2036  . oxyCODONE (Oxy IR/ROXICODONE) immediate release tablet  5-10 mg  5-10 mg Oral Q6H PRN Mayo, Pete Pelt, MD   10 mg at 04/22/18 1900  . [START ON 04/26/2018] predniSONE (DELTASONE) tablet 60 mg  60 mg Oral Q breakfast Mayo, Pete Pelt, MD      . protein supplement (PREMIER PROTEIN) liquid - approved for s/p bariatric surgery  11 oz Oral BID BM Mayo, Pete Pelt, MD   11 oz at 04/24/18 1432  . sodium phosphate (FLEET) 7-19 GM/118ML enema 1 enema  1 enema Rectal Daily PRN Lin Landsman, MD      . traZODone (DESYREL) tablet 25 mg  25 mg Oral QHS PRN Amelia Jo, MD        Musculoskeletal: Strength & Muscle Tone: within normal limits Gait & Station: normal Patient leans: N/A  Psychiatric Specialty Exam: Physical Exam  Nursing note and vitals reviewed. Constitutional: She appears well-developed and well-nourished.  HENT:  Head: Normocephalic and atraumatic.  Eyes: Pupils are equal, round, and reactive to light. Conjunctivae are normal.  Neck: Normal range of motion.  Cardiovascular: Regular rhythm and normal heart sounds.  Respiratory: Effort normal. No respiratory distress.  GI: Soft.  Musculoskeletal: Normal range of motion.  Neurological: She is alert.  Skin: Skin is warm and dry.  Psychiatric: Judgment normal. Her mood appears anxious. Her speech is tangential. She is agitated. She is not aggressive. Thought content is not paranoid. Cognition and memory are normal. She expresses no homicidal and no suicidal ideation.    Review of Systems  Constitutional: Negative.   HENT: Negative.   Eyes: Negative.   Respiratory: Negative.   Cardiovascular: Negative.   Gastrointestinal: Negative.   Musculoskeletal: Negative.   Skin: Negative.   Neurological: Negative.   Psychiatric/Behavioral: Negative for depression, hallucinations, substance abuse and suicidal ideas. The patient is nervous/anxious.     Blood pressure 139/84, pulse 79, temperature 97.7 F (36.5 C), temperature source Oral, resp. rate 16, weight 97.6 kg, SpO2 96 %.Body mass  index is 34.73 kg/m.  General Appearance: Casual  Eye Contact:  Good  Speech:  Clear and Coherent  Volume:  Normal  Mood:  Anxious  Affect:  Appropriate  Thought Process:  Goal Directed  Orientation:  Full (Time, Place, and Person)  Thought Content:  Logical  Suicidal Thoughts:  No  Homicidal Thoughts:  No  Memory:  Immediate;   Fair Recent;   Fair Remote;   Fair  Judgement:  Fair  Insight:  Fair  Psychomotor Activity:  Decreased  Concentration:  Concentration: Fair  Recall:  [employer] of Knowledge:  Fair  Language:  Fair  Akathisia:  No  Handed:  Right  AIMS (if indicated):     Assets:  Desire for Improvement Housing Social Support  ADL's:  Intact  Cognition:  WNL  Sleep:        Treatment Plan Summary: Medication management and Plan Supportive counseling and psychoeducation.  Spent some time with her validating her feelings.  Encouraging her to keep up her efforts to work with her medical problems.  On reviewing her history I think that stopping the citalopram completely is probably overly cautious and unnecessary.  Patient benefited from antidepressants in the past.  I am going to restart the citalopram 10 mg a day because the most recent QT interval I saw was still normal and the patient had always tolerated it before.  She understands the plan.  I will continue to follow  up.  Disposition: No evidence of imminent risk to self or others at present.   Patient does not meet criteria for psychiatric inpatient admission. Supportive therapy provided about ongoing stressors.  Alethia Berthold, MD 04/24/2018 5:13 PM

## 2018-04-24 NOTE — Progress Notes (Signed)
[facility] Hospital Day(s): 4.   Post op day(s):  Marland Kitchen   Interval History: Patient seen and examined, no acute events or new complaints overnight. Patient reports still have pain on left chest. Refers is having bowel movement after bowel prep.   Vital signs in last 24 hours: [min-max] current  Temp:  [97.3 F (36.3 C)-98 F (36.7 C)] 97.7 F (36.5 C) (10/31 1258) Pulse Rate:  [54-86] 79 (10/31 1258) Resp:  [16-20] 16 (10/31 1258) BP: (139-163)/(84-94) 139/84 (10/31 1258) SpO2:  [96 %-100 %] 96 % (10/31 1258) Weight:  [97.6 kg] 97.6 kg (10/31 0500)       Weight: 97.6 kg      Physical Exam:  Constitutional: alert, cooperative and no distress  Respiratory: breathing non-labored at rest  Cardiovascular: regular rate and sinus rhythm  Gastrointestinal: soft, non-tender, and non-distended. Left sided bruise.   Labs:  CBC Latest Ref Rng & Units 04/24/2018 04/23/2018 04/22/2018  WBC 4.0 - 10.5 K/uL 13.4(H) 12.8(H) 10.7(H)  Hemoglobin 12.0 - 15.0 g/dL 8.7(L) 8.0(L) 8.5(L)  Hematocrit 36.0 - 46.0 % 29.9(L) 27.7(L) 29.8(L)  Platelets 150 - 400 K/uL 428(H) 389 366   CMP Latest Ref Rng & Units 04/24/2018 04/23/2018 04/22/2018  Glucose 70 - 99 mg/dL 141(H) 136(H) 131(H)  BUN 8 - 23 mg/dL 11 11 12   Creatinine 0.44 - 1.00 mg/dL 0.76 0.76 0.81  Sodium 135 - 145 mmol/L 135 134(L) 139  Potassium 3.5 - 5.1 mmol/L 4.3 4.2 4.0  Chloride 98 - 111 mmol/L 96(L) 96(L) 100  CO2 22 - 32 mmol/L 28 30 30   Calcium 8.9 - 10.3 mg/dL 9.0 8.5(L) 8.4(L)  Total Protein 6.5 - 8.1 g/dL - - -  Total Bilirubin 0.3 - 1.2 mg/dL - - -  Alkaline Phos 38 - 126 U/L - - -  AST 15 - 41 U/L - - -  ALT 0 - 44 U/L - - -    Imaging studies: No new pertinent imaging studies   Assessment/Plan:  62 y.o.femalewith abdominal pian with suspected large bowel mass as per CT scan, complicated by pertinent comorbidities includingCOPD with acute exacerbation, multifocal pneumonia, Hypertension, Smoker,  multiple abdominal surgeries, obesity with gastric bypass, among recent varicella, Chronic hepatitis C, among others.  Today with better mood and less depressed. Psychiatry evaluation appreciated and has really help this patient. Agree with GI to continue bowel prep on this patient. Right now, patient eating burgers and fries and tolerating well. Will follow during admission and will discuss surgery after findings if indicated.   Arnold Long, MD

## 2018-04-24 NOTE — Progress Notes (Signed)
Cephas Darby, MD [address]  [place]  [place], [place] [address]  Main: [phone]  Fax: [phone] Pager: [phone]   Subjective: Patient had several bowel movements last night and today.  She feels better today.  Eating solid food for lunch.  Objective: Vital signs in last 24 hours: Vitals:   04/24/18 0931 04/24/18 1136 04/24/18 1150 04/24/18 1258  BP: (!) 163/91 (!) 146/91 (!) 155/94 139/84  Pulse: 62 86 75 79  Resp:    16  Temp: 98 F (36.7 C)   97.7 F (36.5 C)  TempSrc: Oral   Oral  SpO2: 96% 98% 100% 96%  Weight:       Weight change: 0.7 kg  Intake/Output Summary (Last 24 hours) at 04/24/2018 1748 Last data filed at 04/24/2018 1547 Gross per 24 hour  Intake 1417.07 ml  Output 350 ml  Net 1067.07 ml     Exam: Heart:: Regular rate and rhythm or S1S2 present Lungs: normal and clear to auscultation Abdomen: soft, distended, tympanic, tender in left upper quadrant Large bruise, ecchymosis in left lateral lower thoracic as well as left flank areas   Lab Results: CBC Latest Ref Rng & Units 04/24/2018 04/23/2018 04/22/2018  WBC 4.0 - 10.5 K/uL 13.4(H) 12.8(H) 10.7(H)  Hemoglobin 12.0 - 15.0 g/dL 8.7(L) 8.0(L) 8.5(L)  Hematocrit 36.0 - 46.0 % 29.9(L) 27.7(L) 29.8(L)  Platelets 150 - 400 K/uL 428(H) 389 366   CMP Latest Ref Rng & Units 04/24/2018 04/23/2018 04/22/2018  Glucose 70 - 99 mg/dL 141(H) 136(H) 131(H)  BUN 8 - 23 mg/dL 11 11 12   Creatinine 0.44 - 1.00 mg/dL 0.76 0.76 0.81  Sodium 135 - 145 mmol/L 135 134(L) 139  Potassium 3.5 - 5.1 mmol/L 4.3 4.2 4.0  Chloride 98 - 111 mmol/L 96(L) 96(L) 100  CO2 22 - 32 mmol/L 28 30 30   Calcium 8.9 - 10.3 mg/dL 9.0 8.5(L) 8.4(L)  Total Protein 6.5 - 8.1 g/dL - - -  Total Bilirubin 0.3 - 1.2 mg/dL - - -  Alkaline Phos 38 - 126 U/L - - -  AST 15 - 41 U/L - - -  ALT 0 - 44 U/L - - -   Micro Results: No results found for this or any previous visit (from the past 240  hour(s)). Studies/Results: No results found. Medications: I have reviewed the patient's current medications. Scheduled Meds: . amLODipine  5 mg Oral Daily  . citalopram  10 mg Oral Daily  . clonazepam  0.25 mg Oral TID  . docusate sodium  100 mg Oral BID  . fluconazole  200 mg Oral Daily  . folic acid  1 mg Oral Daily  . gabapentin  800 mg Oral TID  . insulin aspart  0-9 Units Subcutaneous Q6H  . mouth rinse  15 mL Mouth Rinse BID  . methylPREDNISolone (SOLU-MEDROL) injection  40 mg Intravenous Q12H  . multivitamin with minerals  1 tablet Oral Daily  . polyethylene glycol-electrolytes  4,000 mL Oral Once  . [START ON 04/26/2018] predniSONE  60 mg Oral Q breakfast  . protein supplement shake  11 oz Oral BID BM   Continuous Infusions: . sodium chloride Stopped (04/24/18 0227)  . sodium chloride    . azithromycin 500 mg (04/24/18 0227)  . cefTRIAXone (ROCEPHIN)  IV 200 mL/hr at 04/24/18 0108   PRN Meds:.sodium chloride, acetaminophen **OR** acetaminophen, albuterol, benzonatate, bisacodyl, bisacodyl, guaiFENesin-dextromethorphan, hydrALAZINE, HYDROmorphone (DILAUDID) injection, hydrOXYzine, LORazepam **OR** LORazepam, ondansetron **OR** ondansetron (ZOFRAN) IV, oxyCODONE, sodium  phosphate, traZODone   Assessment: Principal Problem:   Adjustment disorder with mixed disturbance of emotions and conduct Active Problems:   Large bowel obstruction (HCC)  CORINA STACY is a 62 y.o. female with history of Roux-en-Y gastric bypass, chronic hep C, status post treatment with confirmed eradication, no cirrhosis, bullous pemphigoid admitted with 1 week history of worsening left upper quadrant abdominal pain and distention, constipation, CT revealed probable colonic obstruction at the splenic flexure secondary to underlying mass or severe constipation.  Currently, she is also treated for pneumonia and COPD exacerbation based on the CT chest Patient is evaluated by general surgery who did not  recommend surgical intervention at this time. Upper Left lateral pain is secondary to rib fracture which is confirmed on x-ray  Plan: Clear liquid diet Bowel prep tonight N.p.o. 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

## 2018-04-25 ENCOUNTER — Inpatient Hospital Stay: Payer: Medicaid Other

## 2018-04-25 ENCOUNTER — Encounter
Admission: EM | Disposition: A | Discharge status: Home Health | Payer: Self-pay | Source: Home / Self Care | Attending: Internal Medicine

## 2018-04-25 LAB — BASIC METABOLIC PANEL
Anion gap: 10 (ref 5–15)
BUN: 16 mg/dL (ref 8–23)
CALCIUM: 9 mg/dL (ref 8.9–10.3)
CO2: 30 mmol/L (ref 22–32)
Chloride: 99 mmol/L (ref 98–111)
Creatinine, Ser: 0.83 mg/dL (ref 0.44–1.00)
GFR calc Af Amer: >60 mL/min (ref >60)
GLUCOSE: 113 mg/dL — AB (ref 70–99)
Potassium: 4.3 mmol/L (ref 3.5–5.1)
Sodium: 139 mmol/L (ref 135–145)

## 2018-04-25 LAB — CBC
HEMATOCRIT: 34 % — AB (ref 36.0–46.0)
Hemoglobin: 9.5 g/dL — ABNORMAL LOW (ref 12.0–15.0)
MCH: 21.6 pg — AB (ref 26.0–34.0)
MCHC: 27.9 g/dL — ABNORMAL LOW (ref 30.0–36.0)
MCV: 77.3 fL — AB (ref 80.0–100.0)
Platelets: 460 10*3/uL — ABNORMAL HIGH (ref 150–400)
RBC: 4.4 MIL/uL (ref 3.87–5.11)
RDW: 21.8 % — AB (ref 11.5–15.5)
WBC: 19.8 10*3/uL — ABNORMAL HIGH (ref 4.0–10.5)
nRBC: 0.4 % — ABNORMAL HIGH (ref 0.0–0.2)

## 2018-04-25 LAB — GLUCOSE, CAPILLARY
GLUCOSE-CAPILLARY: 100 mg/dL — AB (ref 70–99)
GLUCOSE-CAPILLARY: 123 mg/dL — AB (ref 70–99)
Glucose-Capillary: 161 mg/dL — ABNORMAL HIGH (ref 70–99)
Glucose-Capillary: 179 mg/dL — ABNORMAL HIGH (ref 70–99)

## 2018-04-25 SURGERY — COLONOSCOPY
Anesthesia: General

## 2018-04-25 MED ORDER — ENSURE ENLIVE PO LIQD
237.0000 mL | Freq: Two times a day (BID) | ORAL | Status: DC
  Administered 2018-04-27: 237 mL via ORAL

## 2018-04-25 MED ORDER — AMLODIPINE BESYLATE 10 MG PO TABS
10.0000 mg | ORAL_TABLET | Freq: Every day | ORAL | Status: DC
  Administered 2018-04-26 – 2018-04-28 (×3): 10 mg via ORAL
  Filled 2018-04-25 (×3): qty 1

## 2018-04-25 MED ORDER — PREDNISONE 50 MG PO TABS
50.0000 mg | ORAL_TABLET | Freq: Every day | ORAL | Status: DC
Start: 2018-04-26 — End: 2018-04-25

## 2018-04-25 MED ORDER — POLYETHYLENE GLYCOL 3350 17 GM/SCOOP PO POWD
1.0000 | Freq: Once | ORAL | Status: AC
  Administered 2018-04-25: 255 g via ORAL
  Filled 2018-04-25: qty 255

## 2018-04-25 MED ORDER — PREDNISONE 20 MG PO TABS
60.0000 mg | ORAL_TABLET | Freq: Every day | ORAL | Status: DC
  Administered 2018-04-26 – 2018-04-28 (×3): 60 mg via ORAL
  Filled 2018-04-25 (×3): qty 3

## 2018-04-25 MED ORDER — AMLODIPINE BESYLATE 5 MG PO TABS
5.0000 mg | ORAL_TABLET | Freq: Once | ORAL | Status: AC
  Administered 2018-04-25: 5 mg via ORAL
  Filled 2018-04-25: qty 1

## 2018-04-25 MED ORDER — MAGNESIUM CITRATE PO SOLN
1.0000 | Freq: Once | ORAL | Status: AC
  Administered 2018-04-25: 1 via ORAL
  Filled 2018-04-25 (×3): qty 296

## 2018-04-25 NOTE — Progress Notes (Signed)
Inpatient Diabetes Program Recommendations  AACE/ADA: New Consensus Statement on Inpatient Glycemic Control (2015)  Target Ranges:  Prepandial:   less than 140 mg/dL      Peak postprandial:   less than 180 mg/dL (1-2 hours)      Critically ill patients:  140 - 180 mg/dL   Results for ALETHA, ALLEBACH (MRN [id number]) as of 04/25/2018 08:56  Ref. Range 04/22/2018 23:51 04/23/2018 06:22 04/23/2018 11:53 04/23/2018 17:04  Glucose-Capillary Latest Ref Range: 70 - 99 mg/dL 151 (H) 136 (H) 155 (H) 109 (H)   Results for JANNIE, DOYLE (MRN [id number]) as of 04/25/2018 08:56  Ref. Range 04/24/2018 00:25 04/24/2018 05:37 04/24/2018 08:11 04/24/2018 12:57 04/24/2018 18:46  Glucose-Capillary Latest Ref Range: 70 - 99 mg/dL 171 (H) 138 (H) 114 (H) 175 (H) 194 (H)   Results for MARETA, CHESNUT (MRN [id number]) as of 04/25/2018 08:56  Ref. Range 04/24/2018 23:51 04/25/2018 05:11  Glucose-Capillary Latest Ref Range: 70 - 99 mg/dL 180 (H) 123 (H)    Admit with: Pneumonia/ COPD/ Large Bowel Obstruction due to Colon Mass  History: DM  Home DM Meds: None listed  Current Orders: Novolog Sensitive Correction Scale/ SSI (0-9 units) Q6 hours      NPO for Colonoscopy today per GI team.  Getting Solumedrol 40 mg BID.  CBGs stable so far on current Novolog regimen.    --Will follow patient during hospitalization--  Wyn Quaker RN, MSN, CDE Diabetes Coordinator Inpatient Glycemic Control Team Team Pager: [phone] (8a-5p)

## 2018-04-25 NOTE — Progress Notes (Signed)
[facility] [place] NAME: Dezra Mandella    MR#:  [id number]  DATE OF BIRTH:  [birth year]  SUBJECTIVE:  States she is nausea. She is now agreeing on taking make citrate..  Shortness of breath has improved. Prep was not the best. REVIEW OF SYSTEMS:  Review of Systems  Constitutional: Negative for chills and fever.  HENT: Negative for congestion and sore throat.   Eyes: Negative for blurred vision and double vision.  Respiratory: Positive for cough, sputum production, shortness of breath and wheezing.   Cardiovascular: Negative for chest pain, palpitations and leg swelling.  Gastrointestinal: Positive for abdominal pain. Negative for nausea and vomiting.  Genitourinary: Negative for dysuria and frequency.  Musculoskeletal: Positive for back pain. Negative for neck pain.  Neurological: Negative for dizziness and headaches.  Psychiatric/Behavioral: Negative for depression. The patient is nervous/anxious.     DRUG ALLERGIES:   Allergies  Allergen Reactions  . Sulfa Antibiotics Anaphylaxis  . Sulfasalazine Anaphylaxis  . Codeine Nausea Only  . Lavender Oil     Sinus swelling, nausea  . Penicillins Rash    Has patient had a PCN reaction causing immediate rash, facial/tongue/throat swelling, SOB or lightheadedness with hypotension: Yes Has patient had a PCN reaction causing severe rash involving mucus membranes or skin necrosis: No Has patient had a PCN reaction that required hospitalization: No Has patient had a PCN reaction occurring within the last 10 years: No If all of the above answers are "NO", then may proceed with Cephalosporin use.   VITALS:  Blood pressure (!) 173/99, pulse (!) 50, temperature 97.6 F (36.4 C), temperature source Oral, resp. rate 18, weight 98.5 kg, SpO2 100 %. PHYSICAL EXAMINATION:  Physical Exam  GENERAL:  62 year-old patient sitting up in chair, coughing intermittently.  Appears anxious, but more  comfortable today. EYES: Pupils equal, round, reactive to light and accommodation. No scleral icterus. Extraocular muscles intact.  HEENT: Head atraumatic, normocephalic. Oropharynx and nasopharynx clear.  Moist mucous membranes NECK:  Supple, no jugular venous distention. No thyroid enlargement, no tenderness.  LUNGS:  Mild expiratory wheezing throughout all lung fields, improved from previous exam.  Normal work of breathing. Nasal cannula in place CARDIOVASCULAR: RRR, S1, S2 normal. No S3/S4.  ABDOMEN: There is tenderness with palpation in the left upper quadrant with large amount of bruising over the left flank, no guarding/rebound.  Otherwise, abdomen is soft and nondistended. Bowel sounds present. EXTREMITIES: No pedal edema, cyanosis, or clubbing.  NEUROLOGIC: No focal weakness. + Global weakness.  Sensation intact to light touch throughout. PSYCHIATRIC: The patient is alert and oriented x 3.  Appears anxious.  SKIN: No obvious rash, lesion, or ulcer.  LABORATORY PANEL:  Female CBC Recent Labs  Lab 04/25/18 0449  WBC 19.8*  HGB 9.5*  HCT 34.0*  PLT 460*   ------------------------------------------------------------------------------------------------------------------ Chemistries  Recent Labs  Lab 04/20/18 1601  04/24/18 1913 04/25/18 0449  NA 134*   < >  --  139  K 4.4   < >  --  4.3  CL 98   < >  --  99  CO2 27   < >  --  30  GLUCOSE 171*   < >  --  113*  BUN 10   < >  --  16  CREATININE 1.05*   < >  --  0.83  CALCIUM 9.0   < >  --  9.0  MG  --   --  2.4  --   AST 34  --   --   --   ALT 17  --   --   --   ALKPHOS 65  --   --   --   BILITOT 0.7  --   --   --    < > = values in this interval not displayed.   RADIOLOGY:  Dg Chest Port 1 View  Result Date: 04/25/2018 CLINICAL DATA:  Shortness of breath, left-sided chest pain EXAM: PORTABLE CHEST 1 VIEW COMPARISON:  04/20/2018 CT chest FINDINGS: Hazy right upper lobe airspace disease not well delineated compared with  prior CT dated 04/20/2018 most consistent with pneumonia. Mild lingular airspace disease likely reflecting atelectasis. Small left pleural effusion and left basilar airspace disease likely reflecting compressive atelectasis. No pneumothorax. Stable cardiomediastinal silhouette. No acute osseous abnormality. IMPRESSION: Hazy right upper lobe airspace disease not well delineated compared with prior CT dated 04/20/2018 most consistent with pneumonia. Mild lingular airspace disease likely reflecting atelectasis. Small left pleural effusion and left basilar airspace disease likely reflecting compressive atelectasis. Electronically Signed   By: Kathreen Devoid   On: 04/25/2018 10:10   ASSESSMENT AND PLAN:  Caela Huot  is a 62 y.o. female with a known history of tobacco abuse, COPD, diabetes type 2, lupus, neuropathy, arthritis and other comorbidities.  Patient has had 13 abdominal surgeries in the past. Patient presented to emergency room for generalized abdominal discomfort and severe pain in the left upper quadrant, worsened by coughing spells.  Constipation is associated  * Multifocal community-acquired pneumonia- remains on 2 L of oxygen---wean to RA -pt is 100% on 2 liter [place] - Procalcitonin negative, but CT chest consistent with pneumonia. -Continue ceftriaxone and azithromycin total of 5 days--completed rx -Wean oxygen as able -chest x-ray from today seems to show improvement  *Acute COPD exacerbation-improved -Continue steroids, inhalers, supplemental oxygen -Continue IV Solu-Medrol for a total of 5 days, will then place patient back on her home prednisone dose 60 mg from tomorrow  *Suspected large bowel mass-has had multiple bowel movements with bowel prep -Continue clear liquid diet -GI following-currently doing bowel prep -Surgery following-recommend colonoscopy first before consideration of surgery -Continue nausea and pain meds  *Acute left rib fracture- patient with left rib  ecchymoses.  Fracture likely related to coughing and chronic steroid use. -Continue IV pain control -Incentive spirometry  *Uncontrolled anxiety and depression-patient continues to be very anxious regarding possible colon mass -Psychiatry consult-recommend continuing Klonopin and citalopram  *Hypertension- normotensive -Continue home Norvasc -IV hydralazine as needed  *Type 2 diabetes- blood sugars well controlled. -Sensitive SSI  *Microcytic anemia- likely iron deficiency in the setting of colonic mass.  Anemia panel unremarkable. -Monitor  *Tobacco abuse.  Smoking cessation was discussed with patient this admission.  *Bullous pemphigoid-follows with outpatient dermatology -Will continue home chronic prednisone after completion of 5-day Solu-Medrol course  All the records are reviewed and case discussed with Care Management/Social Worker. Management plans discussed with the patient, family and they are in agreement.  CODE STATUS: Full Code  TOTAL TIME TAKING CARE OF THIS PATIENT: 30 minutes.   More than 50% of the time was spent in counseling/coordination of care: YES  POSSIBLE D/C unknown, DEPENDING ON CLINICAL CONDITION.   Fritzi Mandes M.D on 04/25/2018 at 10:58 AM  Between 7am to 6pm - Pager - [phone]  After 6pm go to [email or web address] - [employer] [facility]  Office  [phone]  CC: Primary care physician; System,  Pcp Not In  Note: This dictation was prepared with Dragon dictation along with smaller phrase technology. Any transcriptional errors that result from this process are unintentional.

## 2018-04-25 NOTE — Consult Note (Signed)
[facility] Consult   Reason for Consult: Follow-up patient with multiple medical problems and anxiety Referring Physician: Sudini Patient Identification: Christine Nichols MRN:  [id number] Principal Diagnosis: Adjustment disorder with mixed disturbance of emotions and conduct Diagnosis:   Patient Active Problem List   Diagnosis Date Noted  . Adjustment disorder with mixed disturbance of emotions and conduct [F43.25] 04/22/2018  . Large bowel obstruction ([place]) [K56.609] 04/20/2018  . Chronic hepatitis C without hepatic coma ([place]) [B18.2]   . Varicella [B01.9] 10/16/2017    Total Time spent with patient: 20 minutes  Subjective:   Christine Nichols is a 62 y.o. female patient admitted with "I am trying to do my best".  HPI: Patient seen.  Follow-up from previous visits.  Patient with multiple medical problems having a significant amount of anxiety.  This is of the third day I believe that they are trying to gradually pursue enough of a bowel prep that she will be able to get a colonoscopy.  Patient is trying her best at it but it is slow going.  Still feeling nervous and a little dysphoric but not hopeless.  No suicidal thought.  Past Psychiatric History: Chronic anxiety and depression  Risk to Self:   Risk to Others:   Prior Inpatient Therapy:   Prior Outpatient Therapy:    Past Medical History:  Past Medical History:  Diagnosis Date  . Arthritis   . Bullous pemphigoid   . Diabetes mellitus without complication ([place])   . Hypertension   . Lupus ([place])   . Neuropathy     Past Surgical History:  Procedure Laterality Date  . ABDOMINAL HYSTERECTOMY    . ABDOMINAL SURGERY     Family History: No family history on file. Family Psychiatric  History: See previous note Social History:  Social History   Substance and Sexual Activity  Alcohol Use Never  . Frequency: Never     Social History   Substance and Sexual Activity  Drug Use Never    Social History    Socioeconomic History  . Marital status: Married    Spouse name: Not on file  . Number of children: Not on file  . Years of education: Not on file  . Highest education level: Not on file  Occupational History  . Not on file  Social Needs  . Financial resource strain: Not on file  . Food insecurity:    Worry: Not on file    Inability: Not on file  . Transportation needs:    Medical: Not on file    Non-medical: Not on file  Tobacco Use  . Smoking status: Heavy Tobacco Smoker  . Smokeless tobacco: Never Used  Substance and Sexual Activity  . Alcohol use: Never    Frequency: Never  . Drug use: Never  . Sexual activity: Not on file  Lifestyle  . Physical activity:    Days per week: Not on file    Minutes per session: Not on file  . Stress: Not on file  Relationships  . Social connections:    Talks on phone: Not on file    Gets together: Not on file    Attends religious service: Not on file    Active member of club or organization: Not on file    Attends meetings of clubs or organizations: Not on file    Relationship status: Not on file  Other Topics Concern  . Not on file  Social History Narrative  . Not on file  Additional Social History:    Allergies:   Allergies  Allergen Reactions  . Sulfa Antibiotics Anaphylaxis  . Sulfasalazine Anaphylaxis  . Codeine Nausea Only  . Lavender Oil     Sinus swelling, nausea  . Penicillins Rash    Has patient had a PCN reaction causing immediate rash, facial/tongue/throat swelling, SOB or lightheadedness with hypotension: Yes Has patient had a PCN reaction causing severe rash involving mucus membranes or skin necrosis: No Has patient had a PCN reaction that required hospitalization: No Has patient had a PCN reaction occurring within the last 10 years: No If all of the above answers are "NO", then may proceed with Cephalosporin use.    Labs:  Results for orders placed or performed during the hospital encounter of  04/20/18 (from the past 48 hour(s))  Protime-INR     Status: None   Collection Time: 04/23/18  4:40 PM  Result Value Ref Range   Prothrombin Time 13.0 11.4 - 15.2 seconds   INR 0.99     Comment: Performed at [facility], [address] [place], [place] [address]  Glucose, capillary     Status: Abnormal   Collection Time: 04/23/18  5:04 PM  Result Value Ref Range   Glucose-Capillary 109 (H) 70 - 99 mg/dL  Glucose, capillary     Status: Abnormal   Collection Time: 04/24/18 12:25 AM  Result Value Ref Range   Glucose-Capillary 171 (H) 70 - 99 mg/dL  CBC     Status: Abnormal   Collection Time: 04/24/18  4:06 AM  Result Value Ref Range   WBC 13.4 (H) 4.0 - 10.5 K/uL   RBC 3.98 3.87 - 5.11 MIL/uL   Hemoglobin 8.7 (L) 12.0 - 15.0 g/dL    Comment: Reticulocyte Hemoglobin testing may be clinically indicated, consider ordering this additional test [id number]    HCT 29.9 (L) 36.0 - 46.0 %   MCV 75.1 (L) 80.0 - 100.0 fL   MCH 21.9 (L) 26.0 - 34.0 pg   MCHC 29.1 (L) 30.0 - 36.0 g/dL   RDW 21.1 (H) 11.5 - 15.5 %   Platelets 428 (H) 150 - 400 K/uL   nRBC 0.4 (H) 0.0 - 0.2 %    Comment: Performed at [facility], [address] [place], [place] [address]  Basic metabolic panel     Status: Abnormal   Collection Time: 04/24/18  4:06 AM  Result Value Ref Range   Sodium 135 135 - 145 mmol/L   Potassium 4.3 3.5 - 5.1 mmol/L   Chloride 96 (L) 98 - 111 mmol/L   CO2 28 22 - 32 mmol/L   Glucose, Bld 141 (H) 70 - 99 mg/dL   BUN 11 8 - 23 mg/dL   Creatinine, Ser 0.76 0.44 - 1.00 mg/dL   Calcium 9.0 8.9 - 10.3 mg/dL   GFR calc non Af Amer >60 >60 mL/min   GFR calc Af Amer >60 >60 mL/min    Comment: (NOTE) The eGFR has been calculated using the CKD EPI equation. This calculation has not been validated in all clinical situations. eGFR's persistently <60 mL/min signify possible Chronic Kidney Disease.    Anion gap 11 5 - 15    Comment: Performed at [facility],  [address] [place], [place] [address]  Glucose, capillary     Status: Abnormal   Collection Time: 04/24/18  5:37 AM  Result Value Ref Range   Glucose-Capillary 138 (H) 70 - 99 mg/dL  Glucose, capillary  Status: Abnormal   Collection Time: 04/24/18  8:11 AM  Result Value Ref Range   Glucose-Capillary 114 (H) 70 - 99 mg/dL   Comment 1 Notify RN   Glucose, capillary     Status: Abnormal   Collection Time: 04/24/18 12:57 PM  Result Value Ref Range   Glucose-Capillary 175 (H) 70 - 99 mg/dL   Comment 1 Notify RN   Glucose, capillary     Status: Abnormal   Collection Time: 04/24/18  6:46 PM  Result Value Ref Range   Glucose-Capillary 194 (H) 70 - 99 mg/dL   Comment 1 Notify RN   Magnesium     Status: None   Collection Time: 04/24/18  7:13 PM  Result Value Ref Range   Magnesium 2.4 1.7 - 2.4 mg/dL    Comment: Performed at [facility], [address] [place], [place] [address]  Glucose, capillary     Status: Abnormal   Collection Time: 04/24/18 11:51 PM  Result Value Ref Range   Glucose-Capillary 180 (H) 70 - 99 mg/dL   Comment 1 Notify RN   CBC     Status: Abnormal   Collection Time: 04/25/18  4:49 AM  Result Value Ref Range   WBC 19.8 (H) 4.0 - 10.5 K/uL   RBC 4.40 3.87 - 5.11 MIL/uL   Hemoglobin 9.5 (L) 12.0 - 15.0 g/dL   HCT 34.0 (L) 36.0 - 46.0 %   MCV 77.3 (L) 80.0 - 100.0 fL   MCH 21.6 (L) 26.0 - 34.0 pg   MCHC 27.9 (L) 30.0 - 36.0 g/dL   RDW 21.8 (H) 11.5 - 15.5 %   Platelets 460 (H) 150 - 400 K/uL   nRBC 0.4 (H) 0.0 - 0.2 %    Comment: Performed at [facility], [address] [place], [place] [address]  Basic metabolic panel     Status: Abnormal   Collection Time: 04/25/18  4:49 AM  Result Value Ref Range   Sodium 139 135 - 145 mmol/L   Potassium 4.3 3.5 - 5.1 mmol/L   Chloride 99 98 - 111 mmol/L   CO2 30 22 - 32 mmol/L   Glucose, Bld 113 (H) 70 - 99 mg/dL   BUN 16 8 - 23 mg/dL   Creatinine, Ser 0.83 0.44 - 1.00 mg/dL   Calcium  9.0 8.9 - 10.3 mg/dL   GFR calc non Af Amer >60 >60 mL/min   GFR calc Af Amer >60 >60 mL/min    Comment: (NOTE) The eGFR has been calculated using the CKD EPI equation. This calculation has not been validated in all clinical situations. eGFR's persistently <60 mL/min signify possible Chronic Kidney Disease.    Anion gap 10 5 - 15    Comment: Performed at [facility], [address] [place], [place] [address]  Glucose, capillary     Status: Abnormal   Collection Time: 04/25/18  5:11 AM  Result Value Ref Range   Glucose-Capillary 123 (H) 70 - 99 mg/dL  Glucose, capillary     Status: Abnormal   Collection Time: 04/25/18 11:54 AM  Result Value Ref Range   Glucose-Capillary 161 (H) 70 - 99 mg/dL    Current Facility-Administered Medications  Medication Dose Route Frequency Provider Last Rate Last Dose  . 0.9 %  sodium chloride infusion   Intravenous PRN Mayo, Pete Pelt, MD   Stopped at 04/24/18 7353  . 0.9 %  sodium chloride infusion   Intravenous Continuous Vanga, Tally Due, MD 10 mL/hr  at 04/25/18 1419    . acetaminophen (TYLENOL) tablet 650 mg  650 mg Oral Q6H PRN Amelia Jo, MD   650 mg at 04/23/18 1928   Or  . acetaminophen (TYLENOL) suppository 650 mg  650 mg Rectal Q6H PRN Amelia Jo, MD      . albuterol (PROVENTIL) (2.5 MG/3ML) 0.083% nebulizer solution 2.5 mg  2.5 mg Nebulization Q4H PRN Hillary Bow, MD   2.5 mg at 04/23/18 0143  . [START ON 04/26/2018] amLODipine (NORVASC) tablet 10 mg  10 mg Oral Daily Fritzi Mandes, MD      . benzonatate (TESSALON) capsule 200 mg  200 mg Oral TID PRN Mayo, Pete Pelt, MD      . bisacodyl (DULCOLAX) EC tablet 5 mg  5 mg Oral Daily PRN Amelia Jo, MD   5 mg at 04/22/18 0850  . bisacodyl (DULCOLAX) suppository 10 mg  10 mg Rectal Daily PRN Lin Landsman, MD      . citalopram (CELEXA) tablet 10 mg  10 mg Oral Daily Emree Locicero, Madie Reno, MD   10 mg at 04/25/18 0905  . clonazepam (KLONOPIN) disintegrating tablet 0.25 mg  0.25  mg Oral TID Keelin Neville T, MD   0.25 mg at 04/25/18 1525  . docusate sodium (COLACE) capsule 100 mg  100 mg Oral BID Amelia Jo, MD   100 mg at 04/25/18 4098  . feeding supplement (ENSURE ENLIVE) (ENSURE ENLIVE) liquid 237 mL  237 mL Oral BID BM Vanga, Tally Due, MD      . fluconazole (DIFLUCAN) tablet 200 mg  200 mg Oral Daily Mayo, Pete Pelt, MD   200 mg at 04/25/18 0902  . folic acid (FOLVITE) tablet 1 mg  1 mg Oral Daily Mayo, Pete Pelt, MD   1 mg at 04/25/18 0902  . gabapentin (NEURONTIN) capsule 800 mg  800 mg Oral TID Sela Hua, MD   800 mg at 04/25/18 1526  . guaiFENesin-dextromethorphan (ROBITUSSIN DM) 100-10 MG/5ML syrup 5 mL  5 mL Oral Q4H PRN Mayo, Pete Pelt, MD      . hydrALAZINE (APRESOLINE) injection 5 mg  5 mg Intravenous Q4H PRN Mayo, Pete Pelt, MD      . HYDROmorphone (DILAUDID) injection 1 mg  1 mg Intravenous Q3H PRN Mayo, Pete Pelt, MD   1 mg at 04/25/18 0906  . hydrOXYzine (ATARAX/VISTARIL) tablet 25 mg  25 mg Oral TID PRN Mayo, Pete Pelt, MD      . insulin aspart (novoLOG) injection 0-9 Units  0-9 Units Subcutaneous Q6H Mayo, Pete Pelt, MD   2 Units at 04/25/18 1202  . LORazepam (ATIVAN) tablet 1 mg  1 mg Oral Q6H PRN Mayo, Pete Pelt, MD   1 mg at 04/25/18 1215   Or  . LORazepam (ATIVAN) injection 1 mg  1 mg Intramuscular Q6H PRN Mayo, Pete Pelt, MD      . MEDLINE mouth rinse  15 mL Mouth Rinse BID Amelia Jo, MD   15 mL at 04/25/18 0908  . multivitamin with minerals tablet 1 tablet  1 tablet Oral Daily Mayo, Pete Pelt, MD   1 tablet at 04/25/18 0905  . ondansetron (ZOFRAN) tablet 4 mg  4 mg Oral Q6H PRN Amelia Jo, MD       Or  . ondansetron [facility]) injection 4 mg  4 mg Intravenous Q6H PRN Amelia Jo, MD   4 mg at 04/25/18 1320  . oxyCODONE (Oxy IR/ROXICODONE) immediate release tablet 5-10 mg  5-10 mg Oral Q6H  PRN Mayo, Pete Pelt, MD   10 mg at 04/25/18 1417  . [START ON 04/26/2018] predniSONE (DELTASONE) tablet 60 mg  60 mg Oral Q breakfast Fritzi Mandes, MD      . protein supplement (PREMIER PROTEIN) liquid - approved for s/p bariatric surgery  11 oz Oral BID BM Mayo, Pete Pelt, MD   11 oz at 04/24/18 1432  . sodium phosphate (FLEET) 7-19 GM/118ML enema 1 enema  1 enema Rectal Daily PRN Lin Landsman, MD      . traZODone (DESYREL) tablet 25 mg  25 mg Oral QHS PRN Amelia Jo, MD        Musculoskeletal: Strength & Muscle Tone: decreased Gait & Station: unsteady Patient leans: N/A  Psychiatric Specialty Exam: Physical Exam  Nursing note and vitals reviewed. Constitutional: She appears well-developed and well-nourished.  HENT:  Head: Normocephalic and atraumatic.  Eyes: Pupils are equal, round, and reactive to light. Conjunctivae are normal.  Neck: Normal range of motion.  Cardiovascular: Regular rhythm and normal heart sounds.  Respiratory: Effort normal. No respiratory distress.  GI: Soft.  Musculoskeletal: Normal range of motion.  Neurological: She is alert.  Skin: Skin is warm and dry.  Psychiatric: Judgment normal. Her affect is blunt. Her speech is delayed. She is slowed. Cognition and memory are normal. Cognition and memory are not impaired. She expresses no suicidal ideation.    Review of Systems  Constitutional: Negative.   HENT: Negative.   Eyes: Negative.   Respiratory: Negative.   Cardiovascular: Negative.   Gastrointestinal: Negative.   Musculoskeletal: Negative.   Skin: Negative.   Neurological: Negative.   Psychiatric/Behavioral: Negative.     Blood pressure (!) 149/82, pulse 69, temperature 97.6 F (36.4 C), temperature source Oral, resp. rate 14, weight 98.5 kg, SpO2 91 %.Body mass index is 35.05 kg/m.  General Appearance: Casual  Eye Contact:  Fair  Speech:  Slow  Volume:  Decreased  Mood:  Dysphoric  Affect:  Congruent  Thought Process:  Goal Directed  Orientation:  Full (Time, Place, and Person)  Thought Content:  Logical  Suicidal Thoughts:  No  Homicidal Thoughts:  No  Memory:   Immediate;   Fair Recent;   Fair Remote;   Fair  Judgement:  Fair  Insight:  Fair  Psychomotor Activity:  Decreased  Concentration:  Concentration: Fair  Recall:  [employer] of Knowledge:  Fair  Language:  Fair  Akathisia:  No  Handed:  Right  AIMS (if indicated):     Assets:  Desire for Improvement Housing Resilience  ADL's:  Impaired  Cognition:  WNL  Sleep:        Treatment Plan Summary: Daily contact with patient to assess and evaluate symptoms and progress in treatment, Medication management and Plan Patient under a lot of physical stress.  Still anxious but does not appear to be panicking.  Not having any spells that I can see of getting irrational anymore.  Sounds like today she got a double up of the Klonopin and the Ativan which has left her a little oversedated in the afternoon.  I would advise that we continue the standing doses of Klonopin but try to avoid giving both medicines together.  Low dose of Celexa was started.  Continue that medicine.  Supportive therapy.  We will continue to follow.  Disposition: No evidence of imminent risk to self or others at present.   Patient does not meet criteria for psychiatric inpatient admission. Supportive therapy provided about ongoing  stressors.  Alethia Berthold, MD 04/25/2018 3:45 PM

## 2018-04-25 NOTE — Progress Notes (Signed)
Cephas Darby, MD [address]  [place]  [place], [place] [address]  Main: [phone]  Fax: [phone] Pager: [phone]   Subjective: Patient took bowel prep, stools are still liquid brown Patient wishes to ambulate more on her own.  She has tele sitter in her room  Objective: Vital signs in last 24 hours: Vitals:   04/25/18 1119 04/25/18 1120 04/25/18 1121 04/25/18 1158  BP:    (!) 149/82  Pulse:    69  Resp:    14  Temp:    97.6 F (36.4 C)  TempSrc:    Oral  SpO2: 92% 90% 93% 91%  Weight:       Weight change: 0.9 kg  Intake/Output Summary (Last 24 hours) at 04/25/2018 1732 Last data filed at 04/25/2018 1300 Gross per 24 hour  Intake 1399.11 ml  Output 0 ml  Net 1399.11 ml     Exam: Heart:: Regular rate and rhythm or S1S2 present Lungs: normal and clear to auscultation Abdomen: soft, nondistended, tender in left upper quadrant Large bruise, ecchymosis in left lateral lower thoracic as well as left flank areas   Lab Results: CBC Latest Ref Rng & Units 04/25/2018 04/24/2018 04/23/2018  WBC 4.0 - 10.5 K/uL 19.8(H) 13.4(H) 12.8(H)  Hemoglobin 12.0 - 15.0 g/dL 9.5(L) 8.7(L) 8.0(L)  Hematocrit 36.0 - 46.0 % 34.0(L) 29.9(L) 27.7(L)  Platelets 150 - 400 K/uL 460(H) 428(H) 389   CMP Latest Ref Rng & Units 04/25/2018 04/24/2018 04/23/2018  Glucose 70 - 99 mg/dL 113(H) 141(H) 136(H)  BUN 8 - 23 mg/dL 16 11 11   Creatinine 0.44 - 1.00 mg/dL 0.83 0.76 0.76  Sodium 135 - 145 mmol/L 139 135 134(L)  Potassium 3.5 - 5.1 mmol/L 4.3 4.3 4.2  Chloride 98 - 111 mmol/L 99 96(L) 96(L)  CO2 22 - 32 mmol/L 30 28 30   Calcium 8.9 - 10.3 mg/dL 9.0 9.0 8.5(L)  Total Protein 6.5 - 8.1 g/dL - - -  Total Bilirubin 0.3 - 1.2 mg/dL - - -  Alkaline Phos 38 - 126 U/L - - -  AST 15 - 41 U/L - - -  ALT 0 - 44 U/L - - -   Micro Results: No results found for this or any previous visit (from the past 240 hour(s)). Studies/Results: Dg Chest Port 1 View  Result Date:  04/25/2018 CLINICAL DATA:  Shortness of breath, left-sided chest pain EXAM: PORTABLE CHEST 1 VIEW COMPARISON:  04/20/2018 CT chest FINDINGS: Hazy right upper lobe airspace disease not well delineated compared with prior CT dated 04/20/2018 most consistent with pneumonia. Mild lingular airspace disease likely reflecting atelectasis. Small left pleural effusion and left basilar airspace disease likely reflecting compressive atelectasis. No pneumothorax. Stable cardiomediastinal silhouette. No acute osseous abnormality. IMPRESSION: Hazy right upper lobe airspace disease not well delineated compared with prior CT dated 04/20/2018 most consistent with pneumonia. Mild lingular airspace disease likely reflecting atelectasis. Small left pleural effusion and left basilar airspace disease likely reflecting compressive atelectasis. Electronically Signed   By: Kathreen Devoid   On: 04/25/2018 10:10   Medications: I have reviewed the patient's current medications. Scheduled Meds: . [START ON 04/26/2018] amLODipine  10 mg Oral Daily  . citalopram  10 mg Oral Daily  . clonazepam  0.25 mg Oral TID  . docusate sodium  100 mg Oral BID  . feeding supplement (ENSURE ENLIVE)  237 mL Oral BID BM  . fluconazole  200 mg Oral Daily  . folic acid  1 mg  Oral Daily  . gabapentin  800 mg Oral TID  . insulin aspart  0-9 Units Subcutaneous Q6H  . mouth rinse  15 mL Mouth Rinse BID  . multivitamin with minerals  1 tablet Oral Daily  . [START ON 04/26/2018] predniSONE  60 mg Oral Q breakfast  . protein supplement shake  11 oz Oral BID BM   Continuous Infusions: . sodium chloride Stopped (04/24/18 0227)  . sodium chloride 10 mL/hr at 04/25/18 1419   PRN Meds:.sodium chloride, acetaminophen **OR** acetaminophen, albuterol, benzonatate, bisacodyl, bisacodyl, guaiFENesin-dextromethorphan, hydrALAZINE, HYDROmorphone (DILAUDID) injection, hydrOXYzine, LORazepam **OR** LORazepam, ondansetron **OR** ondansetron (ZOFRAN) IV, oxyCODONE,  sodium phosphate, traZODone   Assessment: Principal Problem:   Adjustment disorder with mixed disturbance of emotions and conduct Active Problems:   Large bowel obstruction (HCC)  Christine Nichols is a 62 y.o. female with history of Roux-en-Y gastric bypass, chronic hep C, status post treatment with confirmed eradication, no cirrhosis, bullous pemphigoid admitted with 1 week history of worsening left upper quadrant abdominal pain and distention, constipation, CT revealed probable colonic obstruction at the splenic flexure secondary to underlying mass or severe constipation.  Currently, she is also treated for pneumonia and COPD exacerbation based on the CT chest Patient is evaluated by general surgery who did not recommend surgical intervention at this time. Upper Left lateral pain is secondary to rib fracture which is confirmed on x-ray   Plan: Clear liquid diet, Ensure BID Bowel prep today N.p.o. past midnight Tentatively plan for colonoscopy tomorrow if stools are clear and depending on OR schedule.  If not, Sunday Anesthesiologist is concerned to put her to anesthesia for colonoscopy due to recent pneumonia Chest x-ray today, remains unchanged.  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 Allen Norris to cover for the weekend   LOS: 5 days   Christine Nichols 04/25/2018, 5:32 PM

## 2018-04-26 LAB — GLUCOSE, CAPILLARY
Glucose-Capillary: 90 mg/dL (ref 70–99)
Glucose-Capillary: 85 mg/dL (ref 70–99)
Glucose-Capillary: 185 mg/dL — ABNORMAL HIGH (ref 70–99)

## 2018-04-26 MED ORDER — POLYETHYLENE GLYCOL 3350 17 GM/SCOOP PO POWD
1.0000 | Freq: Once | ORAL | Status: AC
  Administered 2018-04-26: 255 g via ORAL
  Filled 2018-04-26: qty 255

## 2018-04-26 NOTE — Progress Notes (Signed)
[facility] [facility] NAME: Laronica Bhagat    MR#:  [id number]  DATE OF BIRTH:  06/15/1956  SUBJECTIVE:   Shortness of breath has improved. Prep was not the best... Continues to have brown stools REVIEW OF SYSTEMS:  Review of Systems  Constitutional: Negative for chills and fever.  HENT: Negative for congestion and sore throat.   Eyes: Negative for blurred vision and double vision.  Respiratory: Positive for cough, sputum production, shortness of breath and wheezing.   Cardiovascular: Negative for chest pain, palpitations and leg swelling.  Gastrointestinal: Positive for abdominal pain. Negative for nausea and vomiting.  Genitourinary: Negative for dysuria and frequency.  Musculoskeletal: Positive for back pain. Negative for neck pain.  Neurological: Negative for dizziness and headaches.  Psychiatric/Behavioral: Negative for depression. The patient is nervous/anxious.     DRUG ALLERGIES:   Allergies  Allergen Reactions  . Sulfa Antibiotics Anaphylaxis  . Sulfasalazine Anaphylaxis  . Codeine Nausea Only  . Lavender Oil     Sinus swelling, nausea  . Penicillins Rash    Has patient had a PCN reaction causing immediate rash, facial/tongue/throat swelling, SOB or lightheadedness with hypotension: Yes Has patient had a PCN reaction causing severe rash involving mucus membranes or skin necrosis: No Has patient had a PCN reaction that required hospitalization: No Has patient had a PCN reaction occurring within the last 10 years: No If all of the above answers are "NO", then may proceed with Cephalosporin use.   VITALS:  Blood pressure 125/73, pulse 70, temperature (!) 97.3 F (36.3 C), temperature source Oral, resp. rate 20, weight 98.5 kg, SpO2 91 %. PHYSICAL EXAMINATION:  Physical Exam  GENERAL:  62 year-old patient sitting up in chair, coughing intermittently.  Appears anxious, but more comfortable today. EYES: Pupils equal, round,  reactive to light and accommodation. No scleral icterus. Extraocular muscles intact.  HEENT: Head atraumatic, normocephalic. Oropharynx and nasopharynx clear.  Moist mucous membranes NECK:  Supple, no jugular venous distention. No thyroid enlargement, no tenderness.  LUNGS:  Mild expiratory wheezing lung fields, improved from previous exam.  Normal work of breathing. Nasal cannula in place CARDIOVASCULAR: RRR, S1, S2 normal. No S3/S4.  ABDOMEN: There is tenderness with palpation in the left upper quadrant with large amount of bruising over the left flank, no guarding/rebound.  Otherwise, abdomen is soft and nondistended. Bowel sounds present. EXTREMITIES: No pedal edema, cyanosis, or clubbing.  NEUROLOGIC: No focal weakness. + Global weakness.  Sensation intact to light touch throughout. PSYCHIATRIC: The patient is alert and oriented x 3.    SKIN: No obvious rash, lesion, or ulcer.  LABORATORY PANEL:  Female CBC Recent Labs  Lab 04/25/18 0449  WBC 19.8*  HGB 9.5*  HCT 34.0*  PLT 460*   ------------------------------------------------------------------------------------------------------------------ Chemistries  Recent Labs  Lab 04/20/18 1601  04/24/18 1913 04/25/18 0449  NA 134*   < >  --  139  K 4.4   < >  --  4.3  CL 98   < >  --  99  CO2 27   < >  --  30  GLUCOSE 171*   < >  --  113*  BUN 10   < >  --  16  CREATININE 1.05*   < >  --  0.83  CALCIUM 9.0   < >  --  9.0  MG  --   --  2.4  --   AST 34  --   --   --  ALT 17  --   --   --   ALKPHOS 65  --   --   --   BILITOT 0.7  --   --   --    < > = values in this interval not displayed.   RADIOLOGY:  Dg Abd 1 View  Result Date: 04/25/2018 CLINICAL DATA:  Abdominal distention for 1 week EXAM: ABDOMEN - 1 VIEW COMPARISON:  04/20/2018 FINDINGS: Scattered large and small bowel gas is noted. Postsurgical changes are again identified. No obstructive changes are seen. No findings of constipation are noted. No acute bony  abnormality is seen. IMPRESSION: No acute abnormality noted. Electronically Signed   By: Inez Catalina M.D.   On: 04/25/2018 20:24   ASSESSMENT AND PLAN:  Vianca Bracher  is a 62 y.o. female with a known history of tobacco abuse, COPD, diabetes type 2, lupus, neuropathy, arthritis and other comorbidities.  Patient has had 13 abdominal surgeries in the past. Patient presented to emergency room for generalized abdominal discomfort and severe pain in the left upper quadrant, worsened by coughing spells.  Constipation is associated  * Multifocal community-acquired pneumonia- remains on 2 L of oxygen---wean to RA -pt is 100% on 2 liter  - Procalcitonin negative, but CT chest consistent with pneumonia. -Continue ceftriaxone and azithromycin total of 5 days--completed rx  -Wean oxygen as able -chest x-ray from today seems to show improvement  *Acute COPD exacerbation-improved -Continue steroids, inhalers, supplemental oxygen -received IV Solu-Medrol for a total of 5 days, now  patient back on her home prednisone dose 60 mg   *Suspected large bowel mass-has had multiple bowel movements with bowel prep -Continue clear liquid diet -GI following-currently doing bowel prep -Surgery following-recommend colonoscopy first before consideration of surgery -Continue nausea and pain meds  *Acute left rib fracture- patient with left rib ecchymoses.  Fracture likely related to coughing and chronic steroid use. -Continue IV pain control -Incentive spirometry  *Uncontrolled anxiety and depression-patient continues to be very anxious regarding possible colon mass -Psychiatry consult-recommend continuing Klonopin and citalopram  *Hypertension- normotensive -Continue home Norvasc -IV hydralazine as needed  *Type 2 diabetes- blood sugars well controlled. -Sensitive SSI  *Microcytic anemia- likely iron deficiency in the setting of colonic mass.  Anemia panel unremarkable. -Monitor  *Tobacco abuse.   Smoking cessation was discussed with patient this admission.  *Bullous pemphigoid-follows with outpatient dermatology -Will continue home chronic prednisone after completion of 5-day Solu-Medrol course  All the records are reviewed and case discussed with Care Management/Social Worker. Management plans discussed with the patient, family and they are in agreement.  CODE STATUS: Full Code  TOTAL TIME TAKING CARE OF THIS PATIENT: 30 minutes.   More than 50% of the time was spent in counseling/coordination of care: YES  POSSIBLE D/C unknown, DEPENDING ON CLINICAL CONDITION.   Fritzi Mandes M.D on 04/26/2018 at 2:06 PM  Between 7am to 6pm - Pager - [phone]  After 6pm go to [email or web address] - Proofreader  [facility]  Office  [phone]  CC: Primary care physician; System, Pcp Not In  Note: This dictation was prepared with Dragon dictation along with smaller phrase technology. Any transcriptional errors that result from this process are unintentional.

## 2018-04-26 NOTE — Progress Notes (Signed)
Lucilla Lame, MD [facility]   [address] [place], [place] [address] Phone: [phone] Fax : [phone]   Subjective: This patient has a history of hepatitis C status post treatment with confirmed eradication who also has a history of gastric bypass.  The patient came in with some abdominal pain and underwent a CT scan that revealed a dilated a sending colon and transverse colon with a large amount of stool burden.  There was suggestion of severe constipation versus an obstructing lesion.  The patient was prepped for a colonoscopy to be done 2 days ago but the patient was not cleaned out.  The patient then took more preparation yesterday with good results overnight but she still has some solid dark stools.   Objective: Vital signs in last 24 hours: Vitals:   04/25/18 1121 04/25/18 1158 04/25/18 1958 04/26/18 0553  BP:  (!) 149/82 (!) 157/91 125/84  Pulse:  69 66 65  Resp:  14 20 (!) 22  Temp:  97.6 F (36.4 C) 97.8 F (36.6 C) 97.6 F (36.4 C)  TempSrc:  Oral Oral Oral  SpO2: 93% 91% 97% 93%  Weight:       Weight change:   Intake/Output Summary (Last 24 hours) at 04/26/2018 1233 Last data filed at 04/25/2018 2000 Gross per 24 hour  Intake 1121.67 ml  Output 0 ml  Net 1121.67 ml     Exam: Heart:: Regular rate and rhythm, S1S2 present or without murmur or extra heart sounds Lungs: normal and clear to auscultation and percussion Abdomen: soft, nontender, normal bowel sounds   Lab Results: @LABTEST2 @ Micro Results: No results found for this or any previous visit (from the past 240 hour(s)). Studies/Results: Dg Abd 1 View  Result Date: 04/25/2018 CLINICAL DATA:  Abdominal distention for 1 week EXAM: ABDOMEN - 1 VIEW COMPARISON:  04/20/2018 FINDINGS: Scattered large and small bowel gas is noted. Postsurgical changes are again identified. No obstructive changes are seen. No findings of constipation are noted. No acute bony abnormality is seen. IMPRESSION: No acute  abnormality noted. Electronically Signed   By: Inez Catalina M.D.   On: 04/25/2018 20:24   Dg Chest Port 1 View  Result Date: 04/25/2018 CLINICAL DATA:  Shortness of breath, left-sided chest pain EXAM: PORTABLE CHEST 1 VIEW COMPARISON:  04/20/2018 CT chest FINDINGS: Hazy right upper lobe airspace disease not well delineated compared with prior CT dated 04/20/2018 most consistent with pneumonia. Mild lingular airspace disease likely reflecting atelectasis. Small left pleural effusion and left basilar airspace disease likely reflecting compressive atelectasis. No pneumothorax. Stable cardiomediastinal silhouette. No acute osseous abnormality. IMPRESSION: Hazy right upper lobe airspace disease not well delineated compared with prior CT dated 04/20/2018 most consistent with pneumonia. Mild lingular airspace disease likely reflecting atelectasis. Small left pleural effusion and left basilar airspace disease likely reflecting compressive atelectasis. Electronically Signed   By: Kathreen Devoid   On: 04/25/2018 10:10   Medications: I have reviewed the patient's current medications. Scheduled Meds: . amLODipine  10 mg Oral Daily  . citalopram  10 mg Oral Daily  . clonazepam  0.25 mg Oral TID  . docusate sodium  100 mg Oral BID  . feeding supplement (ENSURE ENLIVE)  237 mL Oral BID BM  . fluconazole  200 mg Oral Daily  . folic acid  1 mg Oral Daily  . gabapentin  800 mg Oral TID  . insulin aspart  0-9 Units Subcutaneous Q6H  . mouth rinse  15 mL Mouth Rinse BID  .  multivitamin with minerals  1 tablet Oral Daily  . polyethylene glycol powder  1 Container Oral Once  . predniSONE  60 mg Oral Q breakfast  . protein supplement shake  11 oz Oral BID BM   Continuous Infusions: . sodium chloride Stopped (04/24/18 0227)  . sodium chloride 10 mL/hr at 04/25/18 1807   PRN Meds:.sodium chloride, acetaminophen **OR** acetaminophen, albuterol, benzonatate, bisacodyl, bisacodyl, guaiFENesin-dextromethorphan,  hydrALAZINE, HYDROmorphone (DILAUDID) injection, hydrOXYzine, LORazepam **OR** LORazepam, ondansetron **OR** ondansetron (ZOFRAN) IV, oxyCODONE, sodium phosphate, traZODone   Assessment: Principal Problem:   Adjustment disorder with mixed disturbance of emotions and conduct Active Problems:   Large bowel obstruction ([place])    Plan: This patient will be prepped again today with MiraLAX and will undergo an attempted colonoscopy tomorrow to look for any colonic obstruction such as strictures narrowing or neoplasm. I have discussed risks & benefits which include, but are not limited to, bleeding, infection, perforation & drug reaction.  The patient agrees with this plan & written consent will be obtained.      LOS: 6 days   Lucilla Lame 04/26/2018, 12:33 PM

## 2018-04-27 ENCOUNTER — Encounter
Admission: EM | Disposition: A | Discharge status: Home Health | Payer: Self-pay | Source: Home / Self Care | Attending: Internal Medicine

## 2018-04-27 ENCOUNTER — Encounter: Payer: Self-pay | Admitting: Anesthesiology

## 2018-04-27 ENCOUNTER — Inpatient Hospital Stay: Payer: Medicaid Other | Admitting: Anesthesiology

## 2018-04-27 DIAGNOSIS — D125 Benign neoplasm of sigmoid colon: Secondary | ICD-10-CM

## 2018-04-27 DIAGNOSIS — R933 Abnormal findings on diagnostic imaging of other parts of digestive tract: Secondary | ICD-10-CM

## 2018-04-27 DIAGNOSIS — D122 Benign neoplasm of ascending colon: Secondary | ICD-10-CM

## 2018-04-27 DIAGNOSIS — D120 Benign neoplasm of cecum: Secondary | ICD-10-CM

## 2018-04-27 DIAGNOSIS — K635 Polyp of colon: Secondary | ICD-10-CM

## 2018-04-27 HISTORY — PX: COLONOSCOPY WITH PROPOFOL: SHX5780

## 2018-04-27 LAB — CBC
HCT: 32.1 % — ABNORMAL LOW (ref 36.0–46.0)
Hemoglobin: 9.1 g/dL — ABNORMAL LOW (ref 12.0–15.0)
MCH: 22.1 pg — AB (ref 26.0–34.0)
MCHC: 28.3 g/dL — AB (ref 30.0–36.0)
MCV: 78.1 fL — ABNORMAL LOW (ref 80.0–100.0)
PLATELETS: 376 10*3/uL (ref 150–400)
RBC: 4.11 MIL/uL (ref 3.87–5.11)
RDW: 22.8 % — ABNORMAL HIGH (ref 11.5–15.5)
WBC: 14.6 10*3/uL — AB (ref 4.0–10.5)
nRBC: 0.9 % — ABNORMAL HIGH (ref 0.0–0.2)

## 2018-04-27 LAB — GLUCOSE, CAPILLARY
GLUCOSE-CAPILLARY: 119 mg/dL — AB (ref 70–99)
GLUCOSE-CAPILLARY: 121 mg/dL — AB (ref 70–99)
GLUCOSE-CAPILLARY: 273 mg/dL — AB (ref 70–99)

## 2018-04-27 SURGERY — COLONOSCOPY WITH PROPOFOL
Anesthesia: General

## 2018-04-27 MED ORDER — PROPOFOL 10 MG/ML IV BOLUS
INTRAVENOUS | Status: DC | PRN
  Administered 2018-04-27: 70 mg via INTRAVENOUS

## 2018-04-27 MED ORDER — METHYLPREDNISOLONE SODIUM SUCC 125 MG IJ SOLR
INTRAMUSCULAR | Status: DC | PRN
  Administered 2018-04-27: 125 mg via INTRAVENOUS

## 2018-04-27 MED ORDER — PROPOFOL 500 MG/50ML IV EMUL
INTRAVENOUS | Status: DC | PRN
  Administered 2018-04-27: 120 ug/kg/min via INTRAVENOUS

## 2018-04-27 MED ORDER — HYDROMORPHONE HCL 1 MG/ML IJ SOLN
1.0000 mg | INTRAMUSCULAR | Status: DC | PRN
  Administered 2018-04-27 – 2018-04-28 (×3): 1 mg via INTRAVENOUS
  Filled 2018-04-27 (×3): qty 1

## 2018-04-27 MED ORDER — LIDOCAINE HCL (PF) 2 % IJ SOLN
INTRAMUSCULAR | Status: AC
  Filled 2018-04-27: qty 10

## 2018-04-27 MED ORDER — SODIUM CHLORIDE 0.9 % IV SOLN: INTRAVENOUS | Status: DC

## 2018-04-27 MED ORDER — PHENYLEPHRINE HCL 10 MG/ML IJ SOLN
INTRAMUSCULAR | Status: AC
  Filled 2018-04-27: qty 1

## 2018-04-27 MED ORDER — LIDOCAINE 2% (20 MG/ML) 5 ML SYRINGE
INTRAMUSCULAR | Status: DC | PRN
  Administered 2018-04-27: 25 mg via INTRAVENOUS

## 2018-04-27 MED ORDER — ONDANSETRON HCL 4 MG/2ML IJ SOLN
INTRAMUSCULAR | Status: AC
  Filled 2018-04-27: qty 2

## 2018-04-27 MED ORDER — PROPOFOL 500 MG/50ML IV EMUL
INTRAVENOUS | Status: AC
  Filled 2018-04-27: qty 100

## 2018-04-27 NOTE — Transfer of Care (Signed)
Immediate Anesthesia Transfer of Care Note  Patient: Christine Nichols  Procedure(s) Performed: COLONOSCOPY WITH PROPOFOL (N/A )  Patient Location: Endoscopy Unit  Anesthesia Type:General  Level of Consciousness: awake  Airway & Oxygen Therapy: Patient Spontanous Breathing and Patient connected to nasal cannula oxygen  Post-op Assessment: Report given to RN and Post -op Vital signs reviewed and stable  Post vital signs: Reviewed  Last Vitals:  Vitals Value Taken Time  BP 122/74 04/27/2018 12:32 PM  Temp 36.1 C 04/27/2018 12:32 PM  Pulse 54 04/27/2018 12:32 PM  Resp 15 04/27/2018 12:32 PM  SpO2 99 % 04/27/2018 12:32 PM    Last Pain:  Vitals:   04/27/18 0732  TempSrc:   PainSc: 4       Patients Stated Pain Goal: 0 (15/94/58 5929)  Complications: No apparent anesthesia complications

## 2018-04-27 NOTE — Progress Notes (Signed)
The patient underwent a colonoscopy today and had numerous polyps removed.  There was no sign of any obstructing mass.  There was sigmoid diverticulosis. I will sign off.  Please call if any further GI concerns or questions.  We would like to thank you for the opportunity to participate in the care of Christine Nichols.

## 2018-04-27 NOTE — Progress Notes (Signed)
[place] [facility] NAME: Christine Nichols    MR#:  [id number]  DATE OF BIRTH:  1955/09/28  SUBJECTIVE:   Shortness of breath has improved. Did Her prep yesterday again. REVIEW OF SYSTEMS:  Review of Systems  Constitutional: Negative for chills and fever.  HENT: Negative for congestion and sore throat.   Eyes: Negative for blurred vision and double vision.  Respiratory: Positive for shortness of breath.   Cardiovascular: Negative for chest pain, palpitations and leg swelling.  Gastrointestinal: Positive for abdominal pain. Negative for nausea and vomiting.  Genitourinary: Negative for dysuria and frequency.  Musculoskeletal: Positive for back pain. Negative for neck pain.  Neurological: Negative for dizziness and headaches.  Psychiatric/Behavioral: Negative for depression. The patient is nervous/anxious.     DRUG ALLERGIES:   Allergies  Allergen Reactions  . Sulfa Antibiotics Anaphylaxis  . Sulfasalazine Anaphylaxis  . Codeine Nausea Only  . Lavender Oil     Sinus swelling, nausea  . Penicillins Rash    Has patient had a PCN reaction causing immediate rash, facial/tongue/throat swelling, SOB or lightheadedness with hypotension: Yes Has patient had a PCN reaction causing severe rash involving mucus membranes or skin necrosis: No Has patient had a PCN reaction that required hospitalization: No Has patient had a PCN reaction occurring within the last 10 years: No If all of the above answers are "NO", then may proceed with Cephalosporin use.   VITALS:  Blood pressure (!) 158/83, pulse 60, temperature 97.8 F (36.6 C), temperature source Oral, resp. rate 20, weight 95.2 kg, SpO2 90 %. PHYSICAL EXAMINATION:  Physical Exam  GENERAL:  62 year-old patient sitting up in chair, coughing intermittently.  Appears anxious, but more comfortable today. EYES: Pupils equal, round, reactive to light and accommodation. No scleral icterus.  Extraocular muscles intact.  HEENT: Head atraumatic, normocephalic. Oropharynx and nasopharynx clear.  Moist mucous membranes NECK:  Supple, no jugular venous distention. No thyroid enlargement, no tenderness.  LUNGS:  Mild expiratory wheezing lung fields, improved from previous exam.  Normal work of breathing. Nasal cannula in place CARDIOVASCULAR: RRR, S1, S2 normal. No S3/S4.  ABDOMEN: There is tenderness with palpation in the left upper quadrant with large amount of bruising over the left flank, no guarding/rebound.  Otherwise, abdomen is soft and nondistended. Bowel sounds present. EXTREMITIES: No pedal edema, cyanosis, or clubbing.  NEUROLOGIC: No focal weakness. + Global weakness.  Sensation intact to light touch throughout. PSYCHIATRIC: The patient is alert and oriented x 3.    SKIN: + rash over her thighs.  LABORATORY PANEL:  Female CBC Recent Labs  Lab 04/25/18 0449  WBC 19.8*  HGB 9.5*  HCT 34.0*  PLT 460*   ------------------------------------------------------------------------------------------------------------------ Chemistries  Recent Labs  Lab 04/20/18 1601  04/24/18 1913 04/25/18 0449  NA 134*   < >  --  139  K 4.4   < >  --  4.3  CL 98   < >  --  99  CO2 27   < >  --  30  GLUCOSE 171*   < >  --  113*  BUN 10   < >  --  16  CREATININE 1.05*   < >  --  0.83  CALCIUM 9.0   < >  --  9.0  MG  --   --  2.4  --   AST 34  --   --   --   ALT 17  --   --   --  ALKPHOS 65  --   --   --   BILITOT 0.7  --   --   --    < > = values in this interval not displayed.   RADIOLOGY:  No results found. ASSESSMENT AND PLAN:  [name]  is a 62 y.o. female with a known history of tobacco abuse, COPD, diabetes type 2, lupus, neuropathy, arthritis and other comorbidities.  Patient has had 13 abdominal surgeries in the past. Patient presented to emergency room for generalized abdominal discomfort and severe pain in the left upper quadrant, worsened by coughing spells.   Constipation is associated  * Multifocal community-acquired pneumonia- remains on 2 L of oxygen---wean to RA -pt is 100% on 2 liter Ragland - Procalcitonin negative, but CT chest consistent with pneumonia. -recieved ceftriaxone and azithromycin total of 5 days--completed rx  -Wean oxygen as able -chest x-ray from today seems to show improvement  *Acute COPD exacerbation-improved -Continue steroids, inhalers, supplemental oxygen -received IV Solu-Medrol for a total of 5 days, now  patient back on her home prednisone dose 60 mg   *Suspected large bowel mass-has had multiple bowel movements with bowel prep -Continue clear liquid diet -GI following-currently doing bowel prep -Surgery following-recommend colonoscopy first before consideration of surgery -Continue nausea and pain meds  *Acute left rib fracture- patient with left rib ecchymoses.  Fracture likely related to coughing and chronic steroid use. -Continue IV pain control -Incentive spirometry  *Uncontrolled anxiety and depression-patient continues to be very anxious regarding possible colon mass -Psychiatry consult-recommend continuing Klonopin and citalopram  *Hypertension- normotensive -Continue home Norvasc -IV hydralazine as needed  *Type 2 diabetes- blood sugars well controlled. -Sensitive SSI  *Microcytic anemia- likely iron deficiency in the setting of colonic mass.  Anemia panel unremarkable. -Monitor  *Tobacco abuse.  Smoking cessation was discussed with patient this admission.  *h/o Bullous pemphigoid-follows with outpatient dermatology -Will continue home chronic prednisone  And allow pt to use her 0.1% triamcinolone oint bid   All the records are reviewed and case discussed with Care Management/Social Worker. Management plans discussed with the patient, family and they are in agreement.  CODE STATUS: Full Code  TOTAL TIME TAKING CARE OF THIS PATIENT: 30 minutes.   More than 50% of the time was spent in  counseling/coordination of care: YES  POSSIBLE D/C unknown, DEPENDING ON CLINICAL CONDITION.   Fritzi Mandes M.D on 04/27/2018 at 8:12 AM  Between 7am to 6pm - Pager - [phone]  After 6pm go to [email or web address] - Proofreader  [facility]  Office  [phone]  CC: Primary care physician; System, Pcp Not In  Note: This dictation was prepared with Dragon dictation along with smaller phrase technology. Any transcriptional errors that result from this process are unintentional.

## 2018-04-27 NOTE — Anesthesia Post-op Follow-up Note (Signed)
Anesthesia QCDR form completed.        

## 2018-04-27 NOTE — Anesthesia Preprocedure Evaluation (Addendum)
Anesthesia Evaluation  Patient identified by MRN, date of birth, ID band Patient awake    Reviewed: Allergy & Precautions, NPO status , Patient's Chart, lab work & pertinent test results  Airway Mallampati: III  TM Distance: <3 FB     Dental  (+) Upper Dentures   Pulmonary pneumonia, unresolved, Current Smoker,           Cardiovascular hypertension, Normal cardiovascular exam     Neuro/Psych PSYCHIATRIC DISORDERS Anxiety  Neuromuscular disease    GI/Hepatic (+) Hepatitis -, CColonic obstruction   Endo/Other  diabetes  Renal/GU negative Renal ROS     Musculoskeletal  (+) Arthritis , Osteoarthritis,    Abdominal   Peds negative pediatric ROS (+)  Hematology  (+) anemia ,   Anesthesia Other Findings Past Medical History: No date: Arthritis No date: Bullous pemphigoid No date: Diabetes mellitus without complication (HCC) No date: Hypertension No date: Lupus (HCC) No date: Neuropathy  Reproductive/Obstetrics                            Anesthesia Physical Anesthesia Plan  ASA: IV  Anesthesia Plan: General   Post-op Pain Management:    Induction: Intravenous, Rapid sequence and Cricoid pressure planned  PONV Risk Score and Plan:   Airway Management Planned: Oral ETT  Additional Equipment:   Intra-op Plan:   Post-operative Plan: Extubation in OR and Possible Post-op intubation/ventilation  Informed Consent: I have reviewed the patients History and Physical, chart, labs and discussed the procedure including the risks, benefits and alternatives for the proposed anesthesia with the patient or authorized representative who has indicated his/her understanding and acceptance.   Dental advisory given  Plan Discussed with: CRNA and Surgeon  Anesthesia Plan Comments:         Anesthesia Quick Evaluation

## 2018-04-27 NOTE — Op Note (Signed)
[facility]astroenterology Patient Name: Christine Nichols Procedure Date: 04/27/2018 11:47 AM MRN: [id number] Account #: [id number] Date of Birth: 1956-06-01 Admit Type: Inpatient Age: 62 Room: [facility] ENDO ROOM 4 Gender: Female Note Status: Finalized Procedure:            Colonoscopy Indications:          Abnormal CT of the GI tract Providers:            Lucilla Lame MD, MD Referring MD:         No Local Md, MD (Referring MD) Medicines:            Propofol per Anesthesia Complications:        No immediate complications. Procedure:            Pre-Anesthesia Assessment:                       - Prior to the procedure, a History and Physical was                        performed, and patient medications and allergies were                        reviewed. The patient's tolerance of previous                        anesthesia was also reviewed. The risks and benefits of                        the procedure and the sedation options and risks were                        discussed with the patient. All questions were                        answered, and informed consent was obtained. Prior                        Anticoagulants: The patient has taken no previous                        anticoagulant or antiplatelet agents. ASA Grade                        Assessment: II - A patient with mild systemic disease.                        After reviewing the risks and benefits, the patient was                        deemed in satisfactory condition to undergo the                        procedure.                       After obtaining informed consent, the colonoscope was                        passed under direct vision. Throughout the procedure,  the patient's blood pressure, pulse, and oxygen                        saturations were monitored continuously. The                        Colonoscope was introduced through the anus and                        advanced  to the the cecum, identified by appendiceal                        orifice and ileocecal valve. The colonoscopy was                        performed without difficulty. The patient tolerated the                        procedure well. The quality of the bowel preparation                        was good. Findings:      The perianal and digital rectal examinations were normal.      A 3 mm polyp was found in the cecum. The polyp was sessile. The polyp       was removed with a cold snare. Resection was complete, but the polyp       tissue was not retrieved.      A 7 mm polyp was found in the ascending colon. The polyp was sessile.       The polyp was removed with a cold snare. Resection and retrieval were       complete.      A 3 mm polyp was found in the sigmoid colon. The polyp was sessile. The       polyp was removed with a cold snare. Resection and retrieval were       complete.      A 9 mm polyp was found in the sigmoid colon. The polyp was pedunculated.       The polyp was removed with a hot snare. Resection and retrieval were       complete. To prevent bleeding post-intervention, two hemostatic clips       were successfully placed (MR conditional). There was no bleeding at the       end of the procedure.      Multiple small-mouthed diverticula were found in the sigmoid colon.      Non-bleeding internal hemorrhoids were found during retroflexion. The       hemorrhoids were Grade II (internal hemorrhoids that prolapse but reduce       spontaneously). Impression:           - One 3 mm polyp in the cecum, removed with a cold                        snare. Complete resection. Polyp tissue not retrieved.                       - One 7 mm polyp in the ascending colon, removed with a  cold snare. Resected and retrieved.                       - One 3 mm polyp in the sigmoid colon, removed with a                        cold snare. Resected and retrieved.                        - One 9 mm polyp in the sigmoid colon, removed with a                        hot snare. Resected and retrieved. Clips (MR                        conditional) were placed.                       - Diverticulosis in the sigmoid colon.                       - Non-bleeding internal hemorrhoids. Recommendation:       - Return patient to hospital ward for ongoing care.                       - Advance diet as tolerated.                       - Continue present medications.                       - Await pathology results.                       - Repeat colonoscopy in 3 years for surveillance. Procedure Code(s):    --- Professional ---                       [phone], Colonoscopy, flexible; with removal of tumor(s),                        polyp(s), or other lesion(s) by snare technique Diagnosis Code(s):    --- Professional ---                       R93.3, Abnormal findings on diagnostic imaging of other                        parts of digestive tract                       D12.5, Benign neoplasm of sigmoid colon                       D12.0, Benign neoplasm of cecum                       D12.2, Benign neoplasm of ascending colon CPT copyright 2018 American Medical Association. All rights reserved. The codes documented in this report are preliminary and upon coder review may  be revised to meet current compliance requirements. Lucilla Lame MD, MD 04/27/2018 12:34:36 PM This report has been signed electronically. Number of Addenda: 0 Note  Initiated On: 04/27/2018 11:47 AM Scope Withdrawal Time: 0 hours 18 minutes 41 seconds  Total Procedure Duration: 0 hours 31 minutes 57 seconds       [facility]

## 2018-04-28 ENCOUNTER — Encounter: Payer: Self-pay | Admitting: Gastroenterology

## 2018-04-28 LAB — GLUCOSE, CAPILLARY
GLUCOSE-CAPILLARY: 150 mg/dL — AB (ref 70–99)
GLUCOSE-CAPILLARY: 214 mg/dL — AB (ref 70–99)
GLUCOSE-CAPILLARY: 260 mg/dL — AB (ref 70–99)

## 2018-04-28 MED ORDER — CLONAZEPAM 0.25 MG PO TBDP: 0.2500 mg | ORAL_TABLET | Freq: Three times a day (TID) | ORAL | 0 refills | Status: DC

## 2018-04-28 MED ORDER — OXYCODONE HCL 5 MG PO TABS: 5.0000 mg | ORAL_TABLET | Freq: Four times a day (QID) | ORAL | 0 refills | Status: DC | PRN

## 2018-04-28 MED ORDER — BENZONATATE 200 MG PO CAPS: 200.0000 mg | ORAL_CAPSULE | Freq: Three times a day (TID) | ORAL | 0 refills | Status: DC | PRN

## 2018-04-28 MED ORDER — PANTOPRAZOLE SODIUM 40 MG PO TBEC
40.0000 mg | DELAYED_RELEASE_TABLET | Freq: Every day | ORAL | Status: DC
  Administered 2018-04-28: 40 mg via ORAL
  Filled 2018-04-28: qty 1

## 2018-04-28 MED ORDER — LORATADINE 10 MG PO TABS
10.0000 mg | ORAL_TABLET | Freq: Every day | ORAL | Status: DC
  Administered 2018-04-28: 10 mg via ORAL
  Filled 2018-04-28: qty 1

## 2018-04-28 MED ORDER — GUAIFENESIN-DM 100-10 MG/5ML PO SYRP: 5.0000 mL | ORAL_SOLUTION | ORAL | 0 refills | Status: DC | PRN

## 2018-04-28 MED ORDER — ADULT MULTIVITAMIN W/MINERALS CH: 1.0000 | ORAL_TABLET | Freq: Every day | ORAL | 1 refills | Status: DC

## 2018-04-28 MED ORDER — LEVOTHYROXINE SODIUM 50 MCG PO TABS
75.0000 ug | ORAL_TABLET | Freq: Every day | ORAL | Status: DC
  Administered 2018-04-28: 75 ug via ORAL
  Filled 2018-04-28: qty 2

## 2018-04-28 NOTE — Evaluation (Signed)
Physical Therapy Evaluation Patient Details Name: Christine Nichols MRN: [id number] DOB: 1955-07-25 Today's Date: 04/28/2018   History of Present Illness  Pt is a 62 y.o. female presenting to hospital 04/20/18 with abdominal pain (greatest on L upper/lower quadrant); also N/V.  Pt admitted with multifocal community acquired PNA, acute COPD exacerbation, and large bowel obstruction.  Pt s/p colonoscopy 05/07/18.  PMH includes Bullous pemphigoid, DM, htn, lupus, neuropathy, chronic hep C, varicella, abdominal surgery (x13), and COPD.  Clinical Impression  Prior to hospital admission, pt was ambulatory (used quad cane or held onto furniture within home).  Pt lives with her husband in 1 level home with 4 steps to enter.  Currently pt is modified independent with bed mobility; CGA with transfers using RW; and CGA ambulating 130 feet with RW.  O2 sats 90% or greater on room air during session (92% end of session resting in bed).  Antalgic gait noted d/t chronic L knee pain.  Pt declined stairs trial d/t not having "brace" for L knee (although pt reporting able to walk without it and described as a type of knee sleeve).  Overall pt appearing with generalized weakness; pt also appearing anxious during session (and tearful intermittently at beginning of session) requiring increased time for session's activities (nurse and MD aware of pt's behavior).  Pt would benefit from skilled PT to address noted impairments and functional limitations (see below for any additional details).  Upon hospital discharge, recommend pt discharge to home with HHPT and use of RW for ambulation.    Follow Up Recommendations Home health PT(assist for stairs)    Equipment Recommendations  Rolling walker with 5" wheels    Recommendations for Other Services       Precautions / Restrictions Precautions Precautions: Fall Restrictions Weight Bearing Restrictions: No      Mobility  Bed Mobility Overal bed mobility: Modified  Independent             General bed mobility comments: Semi-supine to/from sit with HOB elevated with mild increased effort.  Transfers Overall transfer level: Needs assistance Equipment used: Rolling walker (2 wheeled) Transfers: Sit to/from Stand Sit to Stand: Min guard         General transfer comment: increased effort and time to stand from bed; steady controlled descent sitting  Ambulation/Gait Ambulation/Gait assistance: Min guard Gait Distance (Feet): 130 Feet Assistive device: Rolling walker (2 wheeled)   Gait velocity: in general slower gait speed (pt fluctuated during ambulation)   General Gait Details: antalgic; decreased stance time L LE; steady with RW  Stairs Stairs: (pt declined stairs d/t not having L knee brace)          Wheelchair Mobility    Modified Rankin (Stroke Patients Only)       Balance Overall balance assessment: Needs assistance Sitting-balance support: No upper extremity supported;Feet supported Sitting balance-Leahy Scale: Normal Sitting balance - Comments: steady sitting reaching outside BOS   Standing balance support: No upper extremity supported Standing balance-Leahy Scale: Good Standing balance comment: steady standing reaching within BOS                             Pertinent Vitals/Pain Pain Assessment: 0-10 Pain Score: 9  Pain Location: L knee Pain Descriptors / Indicators: Aching;Sore Pain Intervention(s): Limited activity within patient's tolerance;Monitored during session;Repositioned;Patient requesting pain meds-RN notified;RN gave pain meds during session  HR WFL during session.    Home Living Family/patient expects to be  discharged to:: Private residence Living Arrangements: Spouse/significant other Available Help at Discharge: Family Type of Home: Mobile home Home Access: Stairs to enter Entrance Stairs-Rails: None(banister at landing) Technical brewer of Steps: 4 Home Layout: One  level Home Equipment: Cane - quad;Grab bars - tub/shower      Prior Function Level of Independence: Independent with assistive device(s)         Comments: Pt active working.  Occasional use of quad cane or holds onto furniture within home.  H/o 1 fall in last 6 months (R knee gave out).     Hand Dominance        Extremity/Trunk Assessment   Upper Extremity Assessment Upper Extremity Assessment: Generalized weakness    Lower Extremity Assessment Lower Extremity Assessment: Generalized weakness    Cervical / Trunk Assessment Cervical / Trunk Assessment: Normal  Communication   Communication: No difficulties  Cognition Arousal/Alertness: Awake/alert Behavior During Therapy: Anxious Overall Cognitive Status: Within Functional Limits for tasks assessed                                 General Comments: Tearful and appearing anxious intermittently.      General Comments   Nursing cleared pt for participation in physical therapy.  Pt agreeable to PT session.    Exercises  Transfer and gait training with RW.   Assessment/Plan    PT Assessment Patient needs continued PT services  PT Problem List Decreased strength;Decreased activity tolerance;Decreased balance;Decreased mobility;Decreased knowledge of use of DME;Decreased knowledge of precautions;Pain       PT Treatment Interventions DME instruction;Gait training;Stair training;Functional mobility training;Therapeutic activities;Therapeutic exercise;Balance training;Patient/family education    PT Goals (Current goals can be found in the Care Plan section)  Acute Rehab PT Goals Patient Stated Goal: to improve mobility PT Goal Formulation: With patient Time For Goal Achievement: 05/12/18 Potential to Achieve Goals: Good    Frequency Min 2X/week   Barriers to discharge        Co-evaluation               AM-PAC PT "6 Clicks" Daily Activity  Outcome Measure Difficulty turning over in bed  (including adjusting bedclothes, sheets and blankets)?: A Little Difficulty moving from lying on back to sitting on the side of the bed? : A Little Difficulty sitting down on and standing up from a chair with arms (e.g., wheelchair, bedside commode, etc,.)?: Unable Help needed moving to and from a bed to chair (including a wheelchair)?: A Little Help needed walking in hospital room?: A Little Help needed climbing 3-5 steps with a railing? : A Little 6 Click Score: 16    End of Session Equipment Utilized During Treatment: Gait belt Activity Tolerance: Other (comment)(Pt appearing anxious intermittently during session; limited d/t L knee pain) Patient left: in bed;with call bell/phone within reach;with bed alarm set Nurse Communication: Mobility status;Precautions PT Visit Diagnosis: Other abnormalities of gait and mobility (R26.89);Muscle weakness (generalized) (M62.81);History of falling (Z91.81);Difficulty in walking, not elsewhere classified (R26.2)    Time: 2706-2376 PT Time Calculation (min) (ACUTE ONLY): 42 min   Charges:   PT Evaluation $PT Eval Low Complexity: 1 Low PT Treatments $Gait Training: 8-22 mins $Therapeutic Activity: 8-22 mins       Leitha Bleak, PT 04/28/18, 10:06 AM [phone]

## 2018-04-28 NOTE — Progress Notes (Signed)
Patient asking about her Synthroid; noted on PTA list, but not ordered since admission; Dr. Marcille Blanco notified of patient's request to restart her Synthroid; acknowledged; new order written; Barbaraann Faster, RN 04/28/2018 6:13 AM

## 2018-04-28 NOTE — Discharge Instructions (Signed)
Advised to follow up with her [facility] primary care physician and [facility] dermatology office as before

## 2018-04-28 NOTE — Care Management Note (Signed)
Case Management Note  Patient Details  Name: Christine Nichols MRN: [id number] Date of Birth: 01-20-1956   Patient discharged home today.   PT assessed patient and recommended home health.  Patient agreeable to services and selected Christine Nichols.  Referral made to Christine Nichols with Christine Nichols.  RW delivered to room prior to discharge. Patient lives at home with husband.  Husband will be driving patient to follow up appointments. Christine Nichols and denies issues obtaining medications.   Subjective/Objective:                    Action/Plan:   Expected Discharge Date:  04/28/18               Expected Discharge Plan:  Christine Nichols  In-House Referral:     Discharge planning Services  CM Consult  Post Acute Care Choice:  Home Health, Durable Medical Equipment Choice offered to:  Patient, Spouse  DME Arranged:  Walker rolling DME Agency:  Christine Nichols Arranged:  RN, PT Christine Nichols Agency:  Christine Nichols  Status of Service:  Completed, signed off  If discussed at [place] of Stay Meetings, dates discussed:    Additional Comments:  Christine Sessions, RN 04/28/2018, 3:44 PM

## 2018-04-28 NOTE — Progress Notes (Signed)
Discharge order received. Patient is alert and oriented. Vital signs stable . No signs of acute distress. Discharge instructions given. Patient verbalized understanding. No other issues noted at this time.   

## 2018-04-28 NOTE — Discharge Summary (Signed)
[facility] [place] NAME: Christine Nichols    MR#:  [id number]  DATE OF BIRTH:  Feb 07, 1956  DATE OF ADMISSION:  04/20/2018 ADMITTING PHYSICIAN: Amelia Jo, MD  DATE OF DISCHARGE: 04/28/2018  PRIMARY CARE PHYSICIAN: System, Pcp Not In    ADMISSION DIAGNOSIS:  Large bowel obstruction ([place]) [Q00.867]  DISCHARGE DIAGNOSIS:  *multifocal community acquired pneumonia. Completed treatment *acute on chronic COPD exacerbation *large bowel obstruction secondary to severe constipation status post colonoscopy with removal of multiple polyps. Patient will follow-up with G.I. *acute left rib fracture due to coughing *anxiety/depression-- chronic SECONDARY DIAGNOSIS:   Past Medical History:  Diagnosis Date  . Arthritis   . Bullous pemphigoid   . Diabetes mellitus without complication ([place])   . Hypertension   . Lupus ([place])   . Neuropathy     HOSPITAL COURSE:   JudithMcGowanis a62 y.o.femalewith a known history oftobacco abuse, COPD, diabetes type 2, lupus, neuropathy, arthritis and other comorbidities. Patient has had 13 abdominal surgeries in the past. Patient presented to emergency room for generalized abdominal discomfort and severe pain in the left upper quadrant,worsened by coughing spells. Constipation is associated  * Multifocal community-acquired pneumonia- remains on 2 L of oxygen---weaned to RA - Procalcitonin negative, but CT chest consistent with pneumonia. -recieved ceftriaxone and azithromycin total of 5 days--completed rx  -chest x-ray from today seems to show improvement  *Acute on  Chronic COPD exacerbation-improved -Continue steroids, inhalers -received IV Solu-Medrol for a total of 5 days, now  patient back on her home prednisone dose 60 mg   *Suspected large bowel mass-has had multiple bowel movements with bowel prep -status post colonoscopy. No bowel mass noted. Removed multiple colon polyps. Patient  has diverticulosis. -She will follow-up with G.I. to follow-up results on the colon polyp biopsy  *Acute left rib fracture- patient with left rib ecchymoses.  Fracture likely related to coughing and chronic steroid use. -Continue po pain control -Incentive spirometry  *Chronic anxiety and depression -patient continues to be on and off anxious  -Psychiatry consult-recommend continuing Klonopin and citalopram  *Hypertension- normotensive -Continue home Norvasc -IV hydralazine as needed  *Type 2 diabetes- blood sugars well controlled. -Sensitive SSI  *Microcytic anemia- likely iron deficiency in the setting of colonic mass.  Anemia panel unremarkable. -Monitor  *Tobacco abuse.Smoking cessation was discussed with patient this admission.  *h/o Bullous pemphigoid-follows with outpatient dermatology -Will continue home chronic prednisone  And allow pt to use her 0.1% triamcinolone oint bid  she was seen by physical therapy. Home health PT will be arranged. She'll be discharged today. Discussed with patient's husband Mr. Albright.  CONSULTS OBTAINED:  Treatment Team:  Herbert Pun, MD [place], Madie Reno, MD  DRUG ALLERGIES:   Allergies  Allergen Reactions  . Sulfa Antibiotics Anaphylaxis  . Sulfasalazine Anaphylaxis  . Codeine Nausea Only  . Lavender Oil     Sinus swelling, nausea  . Penicillins Rash    Has patient had a PCN reaction causing immediate rash, facial/tongue/throat swelling, SOB or lightheadedness with hypotension: Yes Has patient had a PCN reaction causing severe rash involving mucus membranes or skin necrosis: No Has patient had a PCN reaction that required hospitalization: No Has patient had a PCN reaction occurring within the last 10 years: No If all of the above answers are "NO", then may proceed with Cephalosporin use.    DISCHARGE MEDICATIONS:   Allergies as of 04/28/2018      Reactions   Sulfa Antibiotics Anaphylaxis  Sulfasalazine  Anaphylaxis   Codeine Nausea Only   Lavender Oil    Sinus swelling, nausea   Penicillins Rash   Has patient had a PCN reaction causing immediate rash, facial/tongue/throat swelling, SOB or lightheadedness with hypotension: Yes Has patient had a PCN reaction causing severe rash involving mucus membranes or skin necrosis: No Has patient had a PCN reaction that required hospitalization: No Has patient had a PCN reaction occurring within the last 10 years: No If all of the above answers are "NO", then may proceed with Cephalosporin use.      Medication List    STOP taking these medications   famotidine 20 MG tablet Commonly known as:  PEPCID   HYDROcodone-acetaminophen 5-325 MG tablet Commonly known as:  NORCO/VICODIN   nicotine 21 mg/24hr patch Commonly known as:  NICODERM CQ - dosed in mg/24 hours     TAKE these medications   acetaminophen 500 MG tablet Commonly known as:  TYLENOL Take 500 mg by mouth every 6 (six) hours as needed for mild pain or moderate pain.   albuterol 108 (90 Base) MCG/ACT inhaler Commonly known as:  PROVENTIL HFA;VENTOLIN HFA Inhale 2 puffs into the lungs every 4 (four) hours as needed for wheezing or shortness of breath.   amLODipine 5 MG tablet Commonly known as:  NORVASC Take 10 mg by mouth daily.   benzonatate 200 MG capsule Commonly known as:  TESSALON Take 1 capsule (200 mg total) by mouth 3 (three) times daily as needed for cough.   cetirizine 10 MG tablet Commonly known as:  ZYRTEC Take 10 mg by mouth daily.   citalopram 40 MG tablet Commonly known as:  CELEXA Take 1 tablet by mouth daily.   clobetasol cream 0.05 % Commonly known as:  TEMOVATE Apply topically 2 (two) times daily.   clonazePAM 0.25 MG disintegrating tablet Commonly known as:  KLONOPIN Take 1 tablet (0.25 mg total) by mouth 3 (three) times daily.   docusate sodium 100 MG capsule Commonly known as:  COLACE Take 1 tablet once or twice daily as needed for  constipation while taking narcotic pain medicine   gabapentin 400 MG capsule Commonly known as:  NEURONTIN Take 800 mg by mouth 3 (three) times daily. What changed:  Another medication with the same name was removed. Continue taking this medication, and follow the directions you see here.   guaiFENesin-dextromethorphan 100-10 MG/5ML syrup Commonly known as:  ROBITUSSIN DM Take 5 mLs by mouth every 4 (four) hours as needed for cough.   hydrOXYzine 25 MG tablet Commonly known as:  ATARAX/VISTARIL Take 1 tablet (25 mg total) by mouth every 8 (eight) hours as needed for anxiety or itching.   levothyroxine 75 MCG tablet Commonly known as:  SYNTHROID, LEVOTHROID Take 75 mcg by mouth daily before breakfast.   multivitamin with minerals Tabs tablet Take 1 tablet by mouth daily.   omeprazole 20 MG capsule Commonly known as:  PRILOSEC Take 20 mg by mouth daily.   oxyCODONE 5 MG immediate release tablet Commonly known as:  Oxy IR/ROXICODONE Take 1 tablet (5 mg total) by mouth every 6 (six) hours as needed for severe pain.   predniSONE 20 MG tablet Commonly known as:  DELTASONE Take 3 tablets (60 mg total) by mouth daily with breakfast.   triamcinolone ointment 0.1 % Commonly known as:  KENALOG Apply 1 application topically 2 (two) times daily.       If you experience worsening of your admission symptoms, develop shortness of breath, life  threatening emergency, suicidal or homicidal thoughts you must seek medical attention immediately by calling 911 or calling your MD immediately  if symptoms less severe.  You Must read complete instructions/literature along with all the possible adverse reactions/side effects for all the Medicines you take and that have been prescribed to you. Take any new Medicines after you have completely understood and accept all the possible adverse reactions/side effects.   Please note  You were cared for by a hospitalist during your hospital stay. If you  have any questions about your discharge medications or the care you received while you were in the hospital after you are discharged, you can call the unit and asked to speak with the hospitalist on call if the hospitalist that took care of you is not available. Once you are discharged, your primary care physician will handle any further medical issues. Please note that NO REFILLS for any discharge medications will be authorized once you are discharged, as it is imperative that you return to your primary care physician (or establish a relationship with a primary care physician if you do not have one) for your aftercare needs so that they can reassess your need for medications and monitor your lab values. Today   SUBJECTIVE  tearful this morning after she had a conversation with her husband. She ambulated well with physical therapy using a rolling walker. Complains of generalized body ache.  VITAL SIGNS:  Blood pressure 134/85, pulse 70, temperature 98.1 F (36.7 C), temperature source Oral, resp. rate 20, weight 98.5 kg, SpO2 96 %.  I/O:    Intake/Output Summary (Last 24 hours) at 04/28/2018 0931 Last data filed at 04/28/2018 0518 Gross per 24 hour  Intake 898 ml  Output 902 ml  Net -4 ml    PHYSICAL EXAMINATION:  GENERAL:  62 y.o.-year-old patient lying in the bed with no acute distress. Obese EYES: Pupils equal, round, reactive to light and accommodation. No scleral icterus. Extraocular muscles intact.  HEENT: Head atraumatic, normocephalic. Oropharynx and nasopharynx clear.  NECK:  Supple, no jugular venous distention. No thyroid enlargement, no tenderness.  LUNGS: Normal breath sounds bilaterally, no wheezing, rales,rhonchi or crepitation. No use of accessory muscles of respiration.  CARDIOVASCULAR: S1, S2 normal. No murmurs, rubs, or gallops.  ABDOMEN: Soft, non-tender, non-distended. Bowel sounds present. No organomegaly or mass.  EXTREMITIES: No pedal edema, cyanosis, or clubbing.   NEUROLOGIC: Cranial nerves II through XII are intact. Muscle strength 5/5 in all extremities. Sensation intact. Gait not checked.  PSYCHIATRIC: The patient is alert and oriented x 3.  SKIN: chronic bilateral skin rash on both lower extremity  DATA REVIEW:   CBC  Recent Labs  Lab 04/27/18 0829  WBC 14.6*  HGB 9.1*  HCT 32.1*  PLT 376    Chemistries  Recent Labs  Lab 04/24/18 1913 04/25/18 0449  NA  --  139  K  --  4.3  CL  --  99  CO2  --  30  GLUCOSE  --  113*  BUN  --  16  CREATININE  --  0.83  CALCIUM  --  9.0  MG 2.4  --     Microbiology Results   No results found for this or any previous visit (from the past 240 hour(s)).  RADIOLOGY:  No results found.   Management plans discussed with the patient, family and they are in agreement.  CODE STATUS:     Code Status Orders  (From admission, onward)  Start     Ordered   04/21/18 0103  Full code  Continuous     04/21/18 0102        Code Status History    Date Active Date Inactive Code Status Order ID Comments User Context   10/16/2017 1848 10/24/2017 1725 Full Code [id number]  Demetrios Loll, MD Inpatient      TOTAL TIME TAKING CARE OF THIS PATIENT: *40* minutes.    Fritzi Mandes M.D on 04/28/2018 at 9:31 AM  Between 7am to 6pm - Pager - [phone] After 6pm go to [email or web address] - password [email or web address] [facility]  Office  [phone]  CC: Primary care physician; System, Pcp Not In

## 2018-04-28 NOTE — Anesthesia Postprocedure Evaluation (Signed)
Anesthesia Post Note  Patient: Christine Nichols  Procedure(s) Performed: COLONOSCOPY WITH PROPOFOL (N/A )  Patient location during evaluation: PACU Anesthesia Type: General Level of consciousness: awake and alert and oriented Pain management: pain level controlled Vital Signs Assessment: post-procedure vital signs reviewed and stable Respiratory status: spontaneous breathing Cardiovascular status: blood pressure returned to baseline Anesthetic complications: no     Last Vitals:  Vitals:   04/27/18 1315 04/27/18 2012  BP: 140/84 130/84  Pulse: 61 79  Resp: 20 20  Temp: 36.7 C 36.8 C  SpO2: 92% 93%    Last Pain:  Vitals:   04/27/18 2239  TempSrc:   PainSc: 8                  Inis Borneman

## 2018-04-29 ENCOUNTER — Encounter: Payer: Self-pay | Admitting: Gastroenterology

## 2018-04-29 LAB — SURGICAL PATHOLOGY

## 2018-04-30 ENCOUNTER — Encounter
Admit: 2018-04-30 | Discharge: 2018-05-01 | Payer: MEDICAID | Attending: Student in an Organized Health Care Education/Training Program | Primary: Student in an Organized Health Care Education/Training Program

## 2018-04-30 DIAGNOSIS — L120 Bullous pemphigoid: Secondary | ICD-10-CM

## 2018-04-30 DIAGNOSIS — Z8719 Personal history of other diseases of the digestive system: Secondary | ICD-10-CM

## 2018-04-30 DIAGNOSIS — Z1231 Encounter for screening mammogram for malignant neoplasm of breast: Secondary | ICD-10-CM

## 2018-04-30 DIAGNOSIS — E099 Drug or chemical induced diabetes mellitus without complications: Principal | ICD-10-CM

## 2018-04-30 DIAGNOSIS — Z09 Encounter for follow-up examination after completed treatment for conditions other than malignant neoplasm: Secondary | ICD-10-CM

## 2018-04-30 DIAGNOSIS — Z87891 Personal history of nicotine dependence: Secondary | ICD-10-CM

## 2018-04-30 DIAGNOSIS — J189 Pneumonia, unspecified organism: Secondary | ICD-10-CM

## 2018-04-30 DIAGNOSIS — D899 Disorder involving the immune mechanism, unspecified: Secondary | ICD-10-CM

## 2018-04-30 DIAGNOSIS — Z780 Asymptomatic menopausal state: Secondary | ICD-10-CM

## 2018-04-30 DIAGNOSIS — K5909 Other constipation: Secondary | ICD-10-CM

## 2018-04-30 DIAGNOSIS — T380X5A Adverse effect of glucocorticoids and synthetic analogues, initial encounter: Secondary | ICD-10-CM

## 2018-04-30 DIAGNOSIS — K635 Polyp of colon: Secondary | ICD-10-CM

## 2018-04-30 MED ORDER — METFORMIN 500 MG TABLET
ORAL_TABLET | Freq: Every day | ORAL | 3 refills | 0 days | Status: CP
Start: 2018-04-30 — End: 2018-05-29

## 2018-04-30 NOTE — Unmapped (Addendum)
Patient Education     1. We are ordering a Mammogram for screening for breast cancer. They will call you to schedule.  2. We want you to get a CT scan of your lungs in about 4 weeks to follow up how the pneumonia resolved and check for lung cancer.  3. We will watch your blood sugars and no indication for any medications right now.  4. Finish your fluconazole medication and start back on the 1 tab a day for 1 week and then go to 1 tab a week for 6 weeks.  5. Metformin 500mg  one time a day for 14 days and then go to 500mg  two times a day until you are seen.     Knee Pain: Care Instructions  Your Care Instructions    Injuries are a common cause of knee problems. Sudden (acute) injuries may be caused by a direct blow to the knee. They can also be caused by abnormal twisting, bending, or falling on the knee. Pain, bruising, or swelling may be severe, and may start within minutes of the injury.  Overuse is another cause of knee pain. Other causes are climbing stairs, kneeling, and other activities that use the knee. Everyday wear and tear, especially as you get older, also can cause knee pain.  Rest, along with home treatment, often relieves pain and allows your knee to heal. If you have a serious knee injury, you may need tests and treatment.  Follow-up care is a key part of your treatment and safety. Be sure to make and go to all appointments, and call your doctor if you are having problems. It's also a good idea to know your test results and keep a list of the medicines you take.  How can you care for yourself at home?  ?? Be safe with medicines. Read and follow all instructions on the label.  ? If the doctor gave you a prescription medicine for pain, take it as prescribed.  ? If you are not taking a prescription pain medicine, ask your doctor if you can take an over-the-counter medicine.  ?? Rest and protect your knee. Take a break from any activity that may cause pain.  ?? Put ice or a cold pack on your knee for 10 to 20 minutes at a time. Put a thin cloth between the ice and your skin.  ?? Prop up a sore knee on a pillow when you ice it or anytime you sit or lie down for the next 3 days. Try to keep it above the level of your heart. This will help reduce swelling.  ?? If your knee is not swollen, you can put moist heat, a heating pad, or a warm cloth on your knee.  ?? If your doctor recommends an elastic bandage, sleeve, or other type of support for your knee, wear it as directed.  ?? Follow your doctor's instructions about how much weight you can put on your leg. Use a cane, crutches, or a walker as instructed.  ?? Follow your doctor's instructions about activity during your healing process. If you can do mild exercise, slowly increase your activity.  ?? Reach and stay at a healthy weight. Extra weight can strain the joints, especially the knees and hips, and make the pain worse. Losing even a few pounds may help.  When should you call for help?  Call 911 anytime you think you may need emergency care. For example, call if:  ?? ?? You have symptoms of a blood  clot in your lung (called a pulmonary embolism). These may include:  ? Sudden chest pain.  ? Trouble breathing.  ? Coughing up blood.   ??Call your doctor now or seek immediate medical care if:  ?? ?? You have severe or increasing pain.   ?? ?? Your leg or foot turns cold or changes color.   ?? ?? You cannot stand or put weight on your knee.   ?? ?? Your knee looks twisted or bent out of shape.   ?? ?? You cannot move your knee.   ?? ?? You have signs of infection, such as:  ? Increased pain, swelling, warmth, or redness.  ? Red streaks leading from the knee.  ? Pus draining from a place on your knee.  ? A fever.   ?? ?? You have signs of a blood clot in your leg (called a deep vein thrombosis), such as:  ? Pain in your calf, back of the knee, thigh, or groin.  ? Redness and swelling in your leg or groin.   ??Watch closely for changes in your health, and be sure to contact your doctor if:  ?? ?? You have tingling, weakness, or numbness in your knee.   ?? ?? You have any new symptoms, such as swelling.   ?? ?? You have bruises from a knee injury that last longer than 2 weeks.   ?? ?? You do not get better as expected.   Where can you learn more?  Go to [facility] at [email or web address]  [facility] under the Resources menu. Enter K195 in the search box to learn more about Knee Pain or Injury: Care Instructions.  Current as of: December 18, 2017  Content Version: 12.2  ?? 2006-2019 [employer]. Care instructions adapted under license by [facility]. If you have questions about a medical condition or this instruction, always ask your healthcare professional. [employer], [employer] disclaims any warranty or liability for your use of this information.    Patient Education        Knee: Exercises  Introduction  Here are some examples of exercises for you to try. The exercises may be suggested for a condition or for rehabilitation. Start each exercise slowly. Ease off the exercises if you start to have pain.  You will be told when to start these exercises and which ones will work best for you.  How to do the exercises  Quad sets    1. Sit with your leg straight and supported on the floor or a firm bed. (If you feel discomfort in the front or back of your knee, place a small towel roll under your knee.)  2. Tighten the muscles on top of your thigh by pressing the back of your knee flat down to the floor. (If you feel discomfort under your kneecap, place a small towel roll under your knee.)  3. Hold for about 6 seconds, then rest for up to 10 seconds.  4. Do 8 to 12 repetitions several times a day.    Straight-leg raises to the front    1. Lie on your back with your good knee bent so that your foot rests flat on the floor. Your injured leg should be straight. Make sure that your low back has a normal curve. You should be able to slip your flat hand in between the floor and the small of your back, with your palm touching the floor and your back touching the back of your hand.  2.  Tighten the thigh muscles in the injured leg by pressing the back of your knee flat down to the floor. Hold your knee straight.  3. Keeping the thigh muscles tight, lift your injured leg up so that your heel is about 12 inches off the floor. Hold for about 6 seconds and then lower slowly.  4. Do 8 to 12 repetitions, 3 times a day.    Straight-leg raises to the outside    1. Lie on your side, with your injured leg on top.  2. Tighten the front thigh muscles of your injured leg to keep your knee straight.  3. Keep your hip and your leg straight in line with the rest of your body, and keep your knee pointing forward. Do not drop your hip back.  4. Lift your injured leg straight up toward the ceiling, about 12 inches off the floor. Hold for about 6 seconds, then slowly lower your leg.  5. Do 8 to 12 repetitions.    Straight-leg raises to the back    1. Lie on your stomach, and lift your leg straight up behind you (toward the ceiling).  2. Lift your toes about 6 inches off the floor, hold for about 6 seconds, then lower slowly.  3. Do 8 to 12 repetitions.    Straight-leg raises to the inside    1. Lie on the side of your body with the injured leg.  2. You can either prop your other (good) leg up on a chair, or you can bend your good knee and put that foot in front of your injured knee. Do not drop your hip back.  3. Tighten the muscles on the front of your thigh to straighten your injured knee.  4. Keep your kneecap pointing forward, and lift your whole leg up toward the ceiling about 6 inches. Hold for about 6 seconds, then lower slowly.  5. Do 8 to 12 repetitions.    Heel dig bridging    1. Lie on your back with both knees bent and your ankles bent so that only your heels are digging into the floor. Your knees should be bent about 90 degrees.  2. Then push your heels into the floor, squeeze your buttocks, and lift your hips off the floor until your shoulders, hips, and knees are all in a straight line.  3. Hold for about 6 seconds as you continue to breathe normally, and then slowly lower your hips back down to the floor and rest for up to 10 seconds.  4. Do 8 to 12 repetitions.    Hamstring curls    1. Lie on your stomach with your knees straight. If your kneecap is uncomfortable, roll up a washcloth and put it under your leg just above your kneecap.  2. Lift the foot of your injured leg by bending the knee so that you bring the foot up toward your buttock. If this motion hurts, try it without bending your knee quite as far. This may help you avoid any painful motion.  3. Slowly lower your leg back to the floor.  4. Do 8 to 12 repetitions.  5. With permission from your doctor or physical therapist, you may also want to add a cuff weight to your ankle (not more than 5 pounds). With weight, you do not have to lift your leg more than 12 inches to get a hamstring workout.    Shallow standing knee bends    1. Stand with your hands lightly resting  on a counter or chair in front of you. Put your feet shoulder-width apart.  2. Slowly bend your knees so that you squat down like you are going to sit in a chair. Make sure your knees do not go in front of your toes.  3. Lower yourself about 6 inches. Your heels should remain on the floor at all times.  4. Rise slowly to a standing position.    Heel raises    1. Stand with your feet a few inches apart, with your hands lightly resting on a counter or chair in front of you.  2. Slowly raise your heels off the floor while keeping your knees straight.  3. Hold for about 6 seconds, then slowly lower your heels to the floor.  4. Do 8 to 12 repetitions several times during the day.    Follow-up care is a key part of your treatment and safety. Be sure to make and go to all appointments, and call your doctor if you are having problems. It's also a good idea to know your test results and keep a list of the medicines you take.  Where can you learn more?  Go to [facility] at [email or web address]  [facility] under the Resources menu. Enter [phone] in the search box to learn more about Knee: Exercises.  Current as of: December 18, 2017  Content Version: 12.2  ?? 2006-2019 [employer]. Care instructions adapted under license by [facility]. If you have questions about a medical condition or this instruction, always ask your healthcare professional. [employer] disclaims any warranty or liability for your use of this information.

## 2018-05-01 NOTE — Unmapped (Signed)
I saw and evaluated the patient, participating in the key portions of the service.  I reviewed the resident???s note.  I agree with the resident???s findings and plan.     Tobey Bride, MD

## 2018-05-01 NOTE — Unmapped (Signed)
Internal Medicine Clinic Visit    Reason for visit: hospital follow up    A/P:    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    Marie Fletcher was seen today for hospitalization follow-up.    Diagnoses and all orders for this visit:    Hospital discharge follow-up  -     Ambulatory referral to Internal Medicine    Chronic constipation    Smoking history  -     CT chest with contrast; Future    History of GI bleed  -     Ambulatory referral to Gastroenterology; Future    Polyp of colon, unspecified part of colon, unspecified type  -     Ambulatory referral to Gastroenterology; Future    Encounter for screening mammogram for breast cancer  -     Mammo Digital Screening Bilateral; Future    Postmenopausal estrogen deficiency  -     Dexa Bone Density Skeletal; Future    Pneumonia of left lung due to infectious organism, unspecified part of lung  -     CT chest with contrast; Future    Bullous pemphigoid    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 For two weeks, then 500 two times a day until seen again.      1. Hospital Discharge Follow Up  Seen 2 days after discharge from [facility].  Her hospitalization was for abdominal pain for which she underwent CT abdomen and pelvis and subsequent colonoscopy, shortness of breath for which she underwent CT chest and had 5 days of ceftriaxone and azithromycin for Alongside IV Solu-Medrol for COPD exacerbation, and left rib fracture.  Reviewed her current medications and changes made at the hospitalization.  No major changes were made.  --Referral placed for pharmacy hospital follow-up clinic    2.  Abnormal colonoscopy  CT abdomen and pelvis on 10/27 at [facility] concerning for thickening of the colon.  See care everywhere report.  Patient underwent colonoscopy during the hospitalization and per discharge summary there were no masses found.  There were multiple polyps which were removed and sent for pathology.  Patient has a follow-up with [facility] on 11/20 however would like to follow with [facility] in the future.  She will go to the currently scheduled follow-up appointment to obtain pathology results and then will follow with [facility].  --Referral to [facility] gastroenterology (discussed with her hepatologist who recommended she see gastroenterology)    3.  Resolving CAP and current smoker  Looking back to her historical records, she had a normal CXR in March 2003.  Then, in November 2005 she had an abnormal two-view chest x-ray when it appears she had a pneumonia.  The chest x-ray was concerning for developing infection versus widespread metastatic disease.  Follow-up plain radiograph in 3 to 6 weeks was recommended but does not appear was done.  2 view chest x-ray in September 2014 showed increased number of lung markings that could have been smoking-related.  In March 2019, the patient had a CT lung cancer screening ordered but never got done.  Now, CT chest from outside hospital showing Patchy area of ground-glass opacity in the right upper lobe most  consistent with pneumonia. Additional smaller ground-glass nodular  densities bilaterally likely represent developing infiltrate and  multifocal pneumonia. Clinical correlation and follow-up to  resolution recommended.  As the patient was recently treated for pneumonia, will plan for repeat imaging in 4 weeks (will be 6 weeks from time of her general CT scan) to assess for resolution as well as screen for lung cancer with her current smoking status.  --CT chest with contrast in 4 weeks    4.  Left rib fracture  Discussed with patient at length about the etiology of her fracture. She reports on 10/25 she was coughing and felt her rib pop.  She denies any sort of trauma or violence that could have caused this injury.  She is on chronic steroids for her bullous pemphigoid, so will check a DEXA scan to evaluate bone density in the setting of this fracture.  --DEXA scan ordered    5.  Steroid-induced diabetes mellitus  A1c on 02/27/2018 was 6.7%, up from 5.7 on 09/12/2017.  Patient is very concerned about this increase.  Very adamant about trying medication.  Will start low-dose metformin and monitor.  Discussed with patient importance of diet and exercise and will also discuss with dermatology their plan for duration of steroids.  --Metformin 500 mg daily for 2 weeks with subsequent increase to 500 mg twice daily after that    6.  Healthcare maintenance  Cancer screening:  ???Breast cancer screening: Patient had previously been ordered to have a mammogram and was not done.  New referral placed  ???Colon cancer screening: Recent colonoscopy done showing polyps.  We will follow-up with GI as to timing of next colonoscopy  ???Cervical cancer screening: Status post hysterectomy  HCM:  ???Influenza vaccine done on 02/27/2018  ???Pneumococcal vaccine up-to-date  ???Zoster vaccine discussion deferred to next visit  ???Smoking cessation counseling deferred to next visit    7.  Knee arthritis  Patient complaining of pain in her left knee for which she is wearing a knee brace.  She is also complaining of neuropathy and is on gabapentin.  Patient has home health PT/OT set up from hospitalization and is amenable to working with therapy to see how this helps her pain.  Will monitor.    8.  Bullous pemphigoid  With patient being on prednisone for such a long duration and now starting methotrexate, will discuss with dermatology regarding the duration planned for immunosuppression to decide about starting PJP prophylaxis.    Return in about 4 weeks (around 05/28/2018).    Staffed with Dr. Barnetta Chapel.    Harland Dingwall, MD  PGY-1 Internal Medicine Resident  Pager: [phone]      __________________________________________________________    HPI:    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    On 10/27, the patient went to [facility] for abdominal pain and constipation.  CT abdomen and pelvis done there showed associated focal colonic wall thickening and  irregularity raising the question of malignant lesion. Recommend  further evaluation with colonoscopy.  She was admitted and underwent colonoscopy.  We do not have the report in our system, however the discharge summary notes that there were no masses seen.  There were polyps that were removed and sent for pathology.  Also during hospitalization, she got a CT chest that showed patchy area of groundglass opacity in the  right upper lobe most consistent with pneumonia with also smaller groundglass nodular densities bilaterally likely representing developing infiltrates and multifocal pneumonia.  She had an oxygen requirement, so was treated with a 5-day course of ceftriaxone and azithromycin for CAP.  She was also treated with IV methylprednisolone for a COPD exacerbation.  The patient reports that prior to the hospitalization, she had been having a bad cough and recalls that on 10/25 she coughs so hard that she felt 1 of her ribs pop.  This was corroborated by the finding of a left rib fracture at the hospitalization.    This morning, she reports she is feeling better than when she was in the hospital, but still overall poorly.  Her most notable symptoms include pain in her left rib, chronic knee arthritis pain in her left knee.  She reports her shortness of breath is improved.  She denies any chest pain.  From the hospitalization, she received a course of oxycodone which she is taking to help her rib pain.  She recognizes that this pain will take time to heal.  She reports that her knee pain has been bothering her for a while.  She previously had problems with her right knee, but thinks she has been compensating for it by putting more weight on her left knee and now her left knee is what is hurting.  She has home health set up with PT and OT coming to her home 3 times a week from the hospitalization so she reports she will try to work with them with the pain.  __________________________________________________________    Problem List:  Patient Active Problem List   Diagnosis   ??? Anemia   ??? Hypertension   ??? Hypothyroidism   ??? Chronic hepatitis C without hepatic coma (CMS-HCC)   ??? Anxiety   ??? History of GI bleed   ??? Chronic constipation   ??? Depression   ??? Smoking history   ??? Itching   ??? Asthma exacerbation   ??? Actinic keratitis   ??? Chronic pain   ??? Bullous pemphigoid       Medications:  Reviewed in EPIC  __________________________________________________________    Physical Exam:   Vital Signs:  Vitals:    04/30/18 1100   BP: 142/74   Pulse: 74   SpO2: 96%   Weight: 97 kg (213 lb 12.8 oz)   Height: 167.6 cm (5' 6)       Gen: Disheveled appearing middle-aged woman smelling of smoke sitting in a wheelchair in NAD  CV: RRR, no murmurs, warm extremities  Pulm: Faint crackles bilaterally in lower lobes, exam limited by patient pain with deep inspiration  Abd: normal BS  Ext: No edema.  Left knee with no effusion, knee brace in place, no warmth or erythema      I have reviewed and addressed the patient???s adherence and response to prescribed medications. I have identified patient barriers to following the proposed medication and treatment plan, and have noted opportunities to optimize healthy behaviors. I have answered the patient???s questions to satisfaction and the patient voices understanding.

## 2018-05-02 NOTE — Unmapped (Signed)
Called Marie Fletcher to follow up from our recent office visit. I had reached out to her dermatologist, Dr. Orma Flaming to discuss the planned duration of Prednisone as I am thinking about the need for PJP prophylaxis. She replied the prednisone will be tapered as the methotrexate begins to show efficacy, but this will likely take place over a few months. Bactrim would have been my choice of medication, but spoke with Ms. Phaneuf who reported she has a severe anaphylactic reaction to sulfa containing medications. With her likely underlying lung disease and the report in her chart of having had an asthma exacerbation in the past, it sounds like she would be contraindicated to using inhaled pentamadine. I spoke with her about having an ID e-consult and she was amenable.  I also spoke with her about her upcoming CT scan. We had discussed having it scheduled 4 weeks from our appointment so we could assess the resolution of her pneumonia. She scheduled it for next week, so she will call to reschedule.  She asked about leg swelling when we spoke. Worse at the end of the day. No edema on our visit two days ago. I recommended she try compression stockings and follow up on this at her next clinic visit in 4 weeks.

## 2018-05-02 NOTE — Unmapped (Signed)
[facility] pharmacy was trying to refill the patients Doxycycline Mono 100MG  Tab Qty:120Take 1 tablet by mouth twice daily. However I am unable to find the medication on her list current or older.    Medicaid will cover the below in Hyclate form

## 2018-05-03 NOTE — Unmapped (Signed)
Thank you for your e-Consult.    To summarize: Marie Fletcher is a 62 yo woman with multiple medical problems including bullous pemphigoid that has required chronic steroid treatment, treated HCV, and diabetes. Her dermatologist wants to start methotrexate and do a very slow steroid taper to treat the bullous pemphigoid. Her primary care physician would like to start PJP prophylaxis since the patient is on 20 mg of prednisone and will be on dual immunosuppressive therapy likely for several months. Unfortunately, the patient has a severe allergy to sulfa, so she cannot take Bactrim for prophylaxis. Pentamidine is relatively contraindicated as well because the patient appears to have some baseline lung dysfunction and reactive airways. We are consulted about alternative regimens and duration of prophylaxis.    My recommendations are as follows: I agree with Dr. Maudry Diego that PJP prophylaxis is warranted in this patient, since it is anticipated that she will be on a relatively high dose of prednisone and a second immunosuppressive regimen (methotrexate) for at least several months. Bactrim and pentamidine are contraindicated, leaving two alternative regimens. The preferred regimen is dapsone 100 mg PO daily, but she should have a G6PD level checked first, as dapsone can cause hemolysis in G6PD-deficient patients. Of note, dapsone is a sulfone, which is usually tolerated in sulfa-allergic patients, but it can occasionally cause reactions in severely sulfa-allergic patients - if used I would recommend a test dose in clinic. Atovaquone 1500 mg PO daily is another alternative regimen (and safe in G6PD-deficient patients), but it is more expensive than dapsone so generally used if the patient cannot tolerate dapsone. However, given the patient's history of severe sulfa allergy, I would favor using atovaquone if affordable.     If prednisone can be tapered off, PJP prophylaxis can be stopped, as methotrexate alone does not increase the risk of PJP as much as steroids.       I spent 16-20 minutes in medical consultative discussion and review of medical records, including a written report to the treating provider via electronic health record regarding the condition of this patient.    This e-Consult did include an answerable clinical question and did not recommend a clinic visit.    Marie Overland, MD    The recommendations provided in this eConsult are based on the clinical data available to me and are furnished without the benefit of a comprehensive in-person evaluation of the patient. Any new clinical issues or changes in patient status not available to me will need to be taken into account when assessing these recommendations. The ongoing management of this patient is the responsibility of the referring clinician. Please contact me if you have further questions.

## 2018-05-05 ENCOUNTER — Telehealth: Payer: Self-pay

## 2018-05-05 NOTE — Telephone Encounter (Signed)
Flagged on EMMI report for having unfilled medications and not being able to fill them today/tomorrow.  Called and spoke with patient who reports filling medications since time of EMMI call.  No questions or concerns at this time.  I thanked her for participating in the [facility] callbacks.

## 2018-05-06 MED ORDER — ATOVAQUONE 750 MG/5 ML ORAL SUSPENSION
Freq: Every day | ORAL | 0 refills | 0 days | Status: CP
Start: 2018-05-06 — End: 2018-06-05

## 2018-05-07 NOTE — Unmapped (Signed)
[facility] [phone] would like orders for physical therapy 2 times a week for 4weeks.  Please let me know if I can do anything to help you.  Thank you.

## 2018-05-07 NOTE — Unmapped (Signed)
Ok to order as noted. Please call home health agency.

## 2018-05-07 NOTE — Unmapped (Signed)
Left message for Johnny Bridge informing of approval of orders.

## 2018-05-08 ENCOUNTER — Ambulatory Visit: Admit: 2018-05-08 | Discharge: 2018-05-09 | Payer: MEDICAID

## 2018-05-08 ENCOUNTER — Encounter: Admit: 2018-05-08 | Discharge: 2018-05-09 | Payer: MEDICAID

## 2018-05-08 DIAGNOSIS — Z780 Asymptomatic menopausal state: Principal | ICD-10-CM

## 2018-05-08 DIAGNOSIS — Z1231 Encounter for screening mammogram for malignant neoplasm of breast: Principal | ICD-10-CM

## 2018-05-12 ENCOUNTER — Encounter
Admit: 2018-05-12 | Discharge: 2018-05-13 | Payer: MEDICAID | Attending: MOHS-Micrographic Surgery | Primary: MOHS-Micrographic Surgery

## 2018-05-12 DIAGNOSIS — L120 Bullous pemphigoid: Secondary | ICD-10-CM

## 2018-05-12 DIAGNOSIS — Z79899 Other long term (current) drug therapy: Secondary | ICD-10-CM

## 2018-05-12 MED ORDER — PREDNISONE 10 MG TABLET
ORAL_TABLET | 2 refills | 0 days | Status: CP
Start: 2018-05-12 — End: 2018-08-04

## 2018-05-14 ENCOUNTER — Other Ambulatory Visit: Payer: Self-pay

## 2018-05-14 ENCOUNTER — Ambulatory Visit (INDEPENDENT_AMBULATORY_CARE_PROVIDER_SITE_OTHER): Payer: Medicaid Other | Admitting: Gastroenterology

## 2018-05-14 ENCOUNTER — Encounter: Payer: Self-pay | Admitting: Gastroenterology

## 2018-05-14 VITALS — BP 135/87 | HR 69 | Resp 16 | Wt 202.0 lb

## 2018-05-14 DIAGNOSIS — D509 Iron deficiency anemia, unspecified: Secondary | ICD-10-CM

## 2018-05-14 NOTE — Progress Notes (Signed)
Christine Darby, MD [address]  [place]  [place], [place] [address]  Main: [phone]  Fax: [phone]    Gastroenterology Consultation  Referring Provider:     No ref. provider found Primary Care Physician:  System, Pcp Not In Primary Gastroenterologist:  Dr. Cephas Nichols Reason for Consultation:     Hospital follow-up, history of polyps        HPI:   Christine Nichols is a 62 y.o. Caucasian female is here as a hospital follow-up.  She has history of chronic hep C, status post treatment and confirmed eradication, history of Roux-en-Y gastric bypass, multiple abdominal surgeries, bullous pemphigoid currently in remission on methotrexate and prednisone taper, A. Fib who was admitted to [facility] on 04/20/2018 with severe left sided upper abdominal pain, progressively worsening associated with nausea and vomiting, constipation. In the ER, she was found to have mild leukocytosis, normal LFTs, lipase normal.  Underwent CT A/P with contrast which revealed dilated ascending and transverse colon with significant stool burden and clear transition zone in the region of splenic flexure.  With focal colon wall thickening and irregularity raising the question of malignant lesion.  Therefore, GI was consulted to evaluate for colonoscopy.   General surgery was consulted who did not recommend surgical intervention under the colonoscopy is performed.  Patient was put on aggressive bowel regimen and she started having bowel movements.  Underwent colonoscopy prior to discharge, there was no evidence of malignancy or large polyps.  However, several small polyps were identified and removed.  She was also found to have left lower rib cage fracture associated with large ecchymosis.  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  Patient also received IV iron in the hospital.  Follow-up visit 05/14/2018 Patient reports that she has been doing well.  She denies constipation.  She reports having  1-2 soft bowel movements daily.  She is also started on metformin 500 mg 2 tablets daily recently.  She reports having stomach discomfort as well as loose stools.  She is not taking any stool softener.  She is accompanied by her sister today.  NSAIDs: None  Antiplts/Anticoagulants/Anti thrombotics: None  GI Procedures: EGD at [facility] 11/27/2017                                Impression:    - Esophageal plaques were found, consistent with            candidiasis.           - Roux-en-Y gastrojejunostomy with gastrojejunal            anastomosis characterized by healthy appearing mucosa.           - Hiatal hernia.           - Normal examined jejunum.           - No specimens collected.  Colonoscopy 04/27/2018 - One 3 mm polyp in the cecum, removed with a cold snare. Complete resection. Polyp tissue not retrieved. - One 7 mm polyp in the ascending colon, removed with a cold snare. Resected and retrieved. - One 3 mm polyp in the sigmoid colon, removed with a cold snare. Resected and retrieved. - One 9 mm polyp in the sigmoid colon, removed with a hot snare. Resected and retrieved. Clips (MR conditional) were placed. - Diverticulosis in the sigmoid colon. - Non-bleeding internal hemorrhoids.  DIAGNOSIS:  A. COLON  POLYP, ASCENDING; COLD SNARE:  - TUBULAR ADENOMA.  - NEGATIVE FOR HIGH-GRADE DYSPLASIA AND MALIGNANCY.   B. COLON POLYP, TRANSVERSE; COLD SNARE:  - SMALL FRAGMENT OF HYPERPLASTIC POLYP.  - FECAL DEBRIS.  - NEGATIVE FOR DYSPLASIA AND MALIGNANCY.   C. COLON POLYP, SIGMOID; HOT SNARE:  - TUBULAR ADENOMA, 1.2 CM.  - NEGATIVE FOR HIGH-GRADE DYSPLASIA AND MALIGNANCY.  Past Medical History:  Diagnosis Date  . Arthritis   . Bullous pemphigoid   . Diabetes mellitus without complication ([place])   . Hypertension   . Lupus ([place])   . Neuropathy     Past Surgical History:  Procedure Laterality  Date  . ABDOMINAL HYSTERECTOMY    . ABDOMINAL SURGERY    . COLONOSCOPY WITH PROPOFOL N/A 04/27/2018   Procedure: COLONOSCOPY WITH PROPOFOL;  Surgeon: Lucilla Lame, MD;  Location: [facility] ENDOSCOPY;  Service: Endoscopy;  Laterality: N/A;    Current Outpatient Medications:  .  acetaminophen (TYLENOL) 500 MG tablet, Take 500 mg by mouth every 6 (six) hours as needed for mild pain or moderate pain., Disp: , Rfl:  .  albuterol (PROVENTIL HFA;VENTOLIN HFA) 108 (90 Base) MCG/ACT inhaler, Inhale 2 puffs into the lungs every 4 (four) hours as needed for wheezing or shortness of breath. , Disp: , Rfl:  .  amLODipine (NORVASC) 5 MG tablet, Take 10 mg by mouth daily., Disp: , Rfl:  .  atovaquone (MEPRON) 750 MG/5ML suspension, Take by mouth., Disp: , Rfl:  .  cetirizine (ZYRTEC) 10 MG tablet, Take 10 mg by mouth daily., Disp: , Rfl:  .  citalopram (CELEXA) 40 MG tablet, Take 1 tablet by mouth daily., Disp: , Rfl: 3 .  clobetasol cream (TEMOVATE) 0.05 %, Apply topically 2 (two) times daily., Disp: 30 g, Rfl: 0 .  diclofenac sodium (VOLTAREN) 1 % GEL, APPLY 2 GRAMS TOPICALLY 4 TIMES DAILY, Disp: , Rfl: 0 .  docusate sodium (COLACE) 100 MG capsule, Take 1 tablet once or twice daily as needed for constipation while taking narcotic pain medicine, Disp: 30 capsule, Rfl: 0 .  ferrous sulfate 325 (65 FE) MG tablet, Take by mouth., Disp: , Rfl:  .  gabapentin (NEURONTIN) 400 MG capsule, Take 800 mg by mouth 3 (three) times daily., Disp: , Rfl:  .  hydrOXYzine (ATARAX/VISTARIL) 25 MG tablet, Take 1 tablet (25 mg total) by mouth every 8 (eight) hours as needed for anxiety or itching., Disp: 90 tablet, Rfl: 0 .  levothyroxine (SYNTHROID, LEVOTHROID) 75 MCG tablet, Take 75 mcg by mouth daily before breakfast., Disp: , Rfl:  .  metFORMIN (GLUCOPHAGE) 500 MG tablet, TAKE 1 TABLET BY MOUTH ONCE DAILY FOR 2 WEEKS. THEN INCREASE TO ONE TABLET TWICE DAILY UNTIL SEEN AGAIN. MAY REFILL USING RX [id number]., Disp: , Rfl: 0 .  Multiple  Vitamin (MULTIVITAMIN WITH MINERALS) TABS tablet, Take 1 tablet by mouth daily., Disp: 30 tablet, Rfl: 1 .  omeprazole (PRILOSEC) 20 MG capsule, Take 20 mg by mouth daily., Disp: , Rfl:  .  predniSONE (DELTASONE) 10 MG tablet, Take 15 mg daily for 1 week, 10 mg daily for 1 week, 5 mg daily for 1 week, then stop, Disp: , Rfl:  .  predniSONE (DELTASONE) 20 MG tablet, Take 3 tablets (60 mg total) by mouth daily with breakfast., Disp: 60 tablet, Rfl: 0 .  triamcinolone ointment (KENALOG) 0.1 %, Apply 1 application topically 2 (two) times daily., Disp: , Rfl:  .  vitamin B-12 (CYANOCOBALAMIN) 1000 MCG tablet, Take by mouth., Disp: , Rfl:  .  benzonatate (TESSALON) 200 MG capsule, Take 1 capsule (200 mg total) by mouth 3 (three) times daily as needed for cough. (Patient not taking: Reported on 05/14/2018), Disp: 20 capsule, Rfl: 0 .  clonazepam (KLONOPIN) 0.25 MG disintegrating tablet, Take 1 tablet (0.25 mg total) by mouth 3 (three) times daily. (Patient not taking: Reported on 05/14/2018), Disp: 40 tablet, Rfl: 0 .  clonazePAM (KLONOPIN) 0.5 MG tablet, Take by mouth., Disp: , Rfl:  .  dimenhyDRINATE (DRAMAMINE) 50 MG tablet, Take by mouth., Disp: , Rfl:  .  docusate sodium (COLACE) 50 MG capsule, Take by mouth., Disp: , Rfl:  .  guaiFENesin-dextromethorphan (ROBITUSSIN DM) 100-10 MG/5ML syrup, Take 5 mLs by mouth every 4 (four) hours as needed for cough. (Patient not taking: Reported on 05/14/2018), Disp: 118 mL, Rfl: 0 .  oxyCODONE (OXY IR/ROXICODONE) 5 MG immediate release tablet, Take 1 tablet (5 mg total) by mouth every 6 (six) hours as needed for severe pain. (Patient not taking: Reported on 05/14/2018), Disp: 20 tablet, Rfl: 0  No family history on file.   Social History   Tobacco Use  . Smoking status: Heavy Tobacco Smoker  . Smokeless tobacco: Never Used  Substance Use Topics  . Alcohol use: Never    Frequency: Never  . Drug use: Never    Allergies as of 05/14/2018 - Review Complete  05/14/2018  Allergen Reaction Noted  . Sulfa antibiotics Anaphylaxis 08/08/2017  . Sulfasalazine Anaphylaxis 08/08/2017  . Codeine Nausea Only 08/08/2017  . Lavender oil  09/18/2016  . Penicillins Rash 08/08/2017    Review of Systems:    All systems reviewed and negative except where noted in HPI.   Physical Exam:  BP 135/87 (BP Location: Left Arm, Patient Position: Sitting, Cuff Size: Large)   Pulse 69   Resp 16   Wt 202 lb (91.6 kg)   BMI 32.60 kg/m  No LMP recorded. Patient is postmenopausal.  General:   Alert,  Well-developed, well-nourished, pleasant and cooperative in NAD Head:  Normocephalic and atraumatic. Eyes:  Sclera clear, no icterus.   Conjunctiva pink. Ears:  Normal auditory acuity. Nose:  No deformity, discharge, or lesions. Mouth:  No deformity or lesions,oropharynx pink & moist. Neck:  Supple; no masses or thyromegaly. Lungs:  Respirations even and unlabored.  Clear throughout to auscultation.   No wheezes, crackles, or rhonchi. No acute distress. Heart:  Regular rate and rhythm; no murmurs, clicks, rubs, or gallops. Abdomen:  Normal bowel sounds. Soft, non-tender and non-distended without masses, hepatosplenomegaly or hernias noted.  Multiple scars on her abdomen from prior surgeries, no guarding or rebound tenderness.   Rectal: Not performed Msk: In wheelchair, symmetrical without gross deformities. Pulses:  Normal pulses noted. Extremities:  No clubbing or edema.  No cyanosis. Neurologic:  Alert and oriented x3;  grossly normal neurologically. Skin:  Intact without significant lesions or rashes. No jaundice. Psych:  Alert and cooperative. Normal mood and affect.  Imaging Studies: Reviewed  Assessment and Plan:   Christine Nichols is a 62 y.o. Caucasian female with history of chronic hep C, status post treatment and confirmed eradication, history of Roux-en-Y gastric bypass, multiple abdominal surgeries, bullous pemphigoid currently in remission on  methotrexate and prednisone taper, A. Fib with recent admission for severe abdominal pain as well as constipation.  Constipation has currently resolved.  Colonoscopy revealed multiple polyps that were removed, pathology consistent with tubular adenomas without dysplasia.  Multiple tubular adenomas of colon Recommend surveillance colonoscopy in 3 years  Iron deficiency  anemia secondary to gastric bypass Recheck CBC, iron studies, B12 and folate panel today Will refer to hematology for parenteral iron if needed   Follow up in 3 months   Christine Darby, MD

## 2018-05-16 LAB — IRON,TIBC AND FERRITIN PANEL
FERRITIN: 211 ng/mL — AB (ref 15–150)
IRON SATURATION: 10 % — AB (ref 15–55)
Iron: 29 ug/dL (ref 27–139)
TIBC: 281 ug/dL (ref 250–450)
UIBC: 252 ug/dL (ref 118–369)

## 2018-05-16 LAB — CBC
HEMATOCRIT: 40 % (ref 34.0–46.6)
Hemoglobin: 12.3 g/dL (ref 11.1–15.9)
MCH: 25.3 pg — ABNORMAL LOW (ref 26.6–33.0)
MCHC: 30.8 g/dL — AB (ref 31.5–35.7)
MCV: 82 fL (ref 79–97)
Platelets: 267 10*3/uL (ref 150–450)
RBC: 4.86 x10E6/uL (ref 3.77–5.28)
RDW: 25.8 % — ABNORMAL HIGH (ref 12.3–15.4)
WBC: 7.4 10*3/uL (ref 3.4–10.8)

## 2018-05-16 LAB — COPPER, SERUM: Copper: 122 ug/dL (ref 72–166)

## 2018-05-19 ENCOUNTER — Other Ambulatory Visit: Payer: Self-pay

## 2018-05-19 ENCOUNTER — Emergency Department: Payer: Medicaid Other

## 2018-05-19 ENCOUNTER — Inpatient Hospital Stay
Admission: EM | Admit: 2018-05-19 | Discharge: 2018-05-22 | DRG: 183 | Disposition: A | Discharge status: Home Health | Payer: Medicaid Other | Attending: Internal Medicine | Admitting: Internal Medicine

## 2018-05-19 DIAGNOSIS — Z8711 Personal history of peptic ulcer disease: Secondary | ICD-10-CM

## 2018-05-19 DIAGNOSIS — Z882 Allergy status to sulfonamides status: Secondary | ICD-10-CM

## 2018-05-19 DIAGNOSIS — Z9889 Other specified postprocedural states: Secondary | ICD-10-CM | POA: Diagnosis not present

## 2018-05-19 DIAGNOSIS — I10 Essential (primary) hypertension: Secondary | ICD-10-CM | POA: Diagnosis present

## 2018-05-19 DIAGNOSIS — F17200 Nicotine dependence, unspecified, uncomplicated: Secondary | ICD-10-CM | POA: Diagnosis present

## 2018-05-19 DIAGNOSIS — R0602 Shortness of breath: Secondary | ICD-10-CM

## 2018-05-19 DIAGNOSIS — J189 Pneumonia, unspecified organism: Secondary | ICD-10-CM | POA: Diagnosis present

## 2018-05-19 DIAGNOSIS — S2242XA Multiple fractures of ribs, left side, initial encounter for closed fracture: Secondary | ICD-10-CM | POA: Diagnosis present

## 2018-05-19 DIAGNOSIS — L120 Bullous pemphigoid: Secondary | ICD-10-CM | POA: Diagnosis present

## 2018-05-19 DIAGNOSIS — I81 Portal vein thrombosis: Secondary | ICD-10-CM | POA: Diagnosis present

## 2018-05-19 DIAGNOSIS — Z7952 Long term (current) use of systemic steroids: Secondary | ICD-10-CM

## 2018-05-19 DIAGNOSIS — Z7984 Long term (current) use of oral hypoglycemic drugs: Secondary | ICD-10-CM | POA: Diagnosis not present

## 2018-05-19 DIAGNOSIS — E119 Type 2 diabetes mellitus without complications: Secondary | ICD-10-CM | POA: Diagnosis present

## 2018-05-19 DIAGNOSIS — X509XXA Other and unspecified overexertion or strenuous movements or postures, initial encounter: Secondary | ICD-10-CM | POA: Diagnosis not present

## 2018-05-19 DIAGNOSIS — Z880 Allergy status to penicillin: Secondary | ICD-10-CM

## 2018-05-19 DIAGNOSIS — J440 Chronic obstructive pulmonary disease with acute lower respiratory infection: Secondary | ICD-10-CM | POA: Diagnosis present

## 2018-05-19 DIAGNOSIS — J90 Pleural effusion, not elsewhere classified: Secondary | ICD-10-CM | POA: Diagnosis not present

## 2018-05-19 DIAGNOSIS — J918 Pleural effusion in other conditions classified elsewhere: Secondary | ICD-10-CM | POA: Diagnosis present

## 2018-05-19 DIAGNOSIS — S2239XA Fracture of one rib, unspecified side, initial encounter for closed fracture: Secondary | ICD-10-CM

## 2018-05-19 DIAGNOSIS — Z888 Allergy status to other drugs, medicaments and biological substances status: Secondary | ICD-10-CM

## 2018-05-19 DIAGNOSIS — E039 Hypothyroidism, unspecified: Secondary | ICD-10-CM | POA: Diagnosis present

## 2018-05-19 DIAGNOSIS — F329 Major depressive disorder, single episode, unspecified: Secondary | ICD-10-CM | POA: Diagnosis present

## 2018-05-19 HISTORY — DX: Systemic involvement of connective tissue, unspecified: M35.9

## 2018-05-19 HISTORY — DX: Unspecified asthma, uncomplicated: J45.909

## 2018-05-19 LAB — CBC
HCT: 39.2 % (ref 36.0–46.0)
HEMOGLOBIN: 11.8 g/dL — AB (ref 12.0–15.0)
MCH: 25.3 pg — AB (ref 26.0–34.0)
MCHC: 30.1 g/dL (ref 30.0–36.0)
MCV: 84.1 fL (ref 80.0–100.0)
Platelets: 259 10*3/uL (ref 150–400)
RBC: 4.66 MIL/uL (ref 3.87–5.11)
RDW: 26.5 % — ABNORMAL HIGH (ref 11.5–15.5)
WBC: 9.8 10*3/uL (ref 4.0–10.5)
nRBC: 0 % (ref 0.0–0.2)

## 2018-05-19 LAB — COMPREHENSIVE METABOLIC PANEL
ALBUMIN: 4.1 g/dL (ref 3.5–5.0)
ALK PHOS: 67 U/L (ref 38–126)
ALT: 15 U/L (ref 0–44)
AST: 18 U/L (ref 15–41)
Anion gap: 10 (ref 5–15)
BILIRUBIN TOTAL: 0.6 mg/dL (ref 0.3–1.2)
BUN: 14 mg/dL (ref 8–23)
CHLORIDE: 99 mmol/L (ref 98–111)
CO2: 26 mmol/L (ref 22–32)
Calcium: 9.2 mg/dL (ref 8.9–10.3)
Creatinine, Ser: 0.76 mg/dL (ref 0.44–1.00)
GFR calc Af Amer: >60 mL/min (ref >60)
GLUCOSE: 100 mg/dL — AB (ref 70–99)
POTASSIUM: 4.2 mmol/L (ref 3.5–5.1)
Sodium: 135 mmol/L (ref 135–145)
TOTAL PROTEIN: 6.9 g/dL (ref 6.5–8.1)

## 2018-05-19 LAB — URINALYSIS, COMPLETE (UACMP) WITH MICROSCOPIC
Bacteria, UA: NONE SEEN
Bilirubin Urine: NEGATIVE
GLUCOSE, UA: NEGATIVE mg/dL
KETONES UR: NEGATIVE mg/dL
Leukocytes, UA: NEGATIVE
Nitrite: NEGATIVE
PROTEIN: NEGATIVE mg/dL
Specific Gravity, Urine: 1.011 (ref 1.005–1.030)
pH: 6 (ref 5.0–8.0)

## 2018-05-19 LAB — LIPASE, BLOOD: Lipase: 45 U/L (ref 11–51)

## 2018-05-19 LAB — GLUCOSE, CAPILLARY
GLUCOSE-CAPILLARY: 99 mg/dL (ref 70–99)
Glucose-Capillary: 116 mg/dL — ABNORMAL HIGH (ref 70–99)

## 2018-05-19 LAB — BRAIN NATRIURETIC PEPTIDE: B Natriuretic Peptide: 27 pg/mL (ref 0.0–100.0)

## 2018-05-19 MED ORDER — IOPAMIDOL (ISOVUE-300) INJECTION 61%
100.0000 mL | Freq: Once | INTRAVENOUS | Status: AC | PRN
  Administered 2018-05-19: 100 mL via INTRAVENOUS

## 2018-05-19 MED ORDER — ACETAMINOPHEN 500 MG PO TABS: 500.0000 mg | ORAL_TABLET | Freq: Four times a day (QID) | ORAL | Status: DC | PRN

## 2018-05-19 MED ORDER — INSULIN ASPART 100 UNIT/ML ~~LOC~~ SOLN
0.0000 [IU] | Freq: Three times a day (TID) | SUBCUTANEOUS | Status: DC
  Administered 2018-05-20 – 2018-05-21 (×2): 1 [IU] via SUBCUTANEOUS
  Filled 2018-05-19 (×2): qty 1

## 2018-05-19 MED ORDER — ONDANSETRON HCL 4 MG PO TABS: 4.0000 mg | ORAL_TABLET | Freq: Four times a day (QID) | ORAL | Status: DC | PRN

## 2018-05-19 MED ORDER — LEVOFLOXACIN IN D5W 750 MG/150ML IV SOLN
750.0000 mg | INTRAVENOUS | Status: DC
  Administered 2018-05-19: 18:00:00 750 mg via INTRAVENOUS
  Filled 2018-05-19 (×2): qty 150

## 2018-05-19 MED ORDER — CALCIUM CARBONATE ANTACID 500 MG PO CHEW
1.0000 | CHEWABLE_TABLET | Freq: Three times a day (TID) | ORAL | Status: DC | PRN
  Administered 2018-05-19: 21:00:00 200 mg via ORAL
  Filled 2018-05-19 (×2): qty 1

## 2018-05-19 MED ORDER — ONDANSETRON HCL 4 MG/2ML IJ SOLN
4.0000 mg | Freq: Four times a day (QID) | INTRAMUSCULAR | Status: DC | PRN
  Administered 2018-05-19 – 2018-05-21 (×5): 4 mg via INTRAVENOUS
  Filled 2018-05-19 (×5): qty 2

## 2018-05-19 MED ORDER — CITALOPRAM HYDROBROMIDE 20 MG PO TABS
40.0000 mg | ORAL_TABLET | Freq: Every day | ORAL | Status: DC
  Administered 2018-05-19 – 2018-05-22 (×4): 40 mg via ORAL
  Filled 2018-05-19 (×5): qty 2

## 2018-05-19 MED ORDER — PREDNISONE 20 MG PO TABS
20.0000 mg | ORAL_TABLET | Freq: Every day | ORAL | Status: DC
  Administered 2018-05-20 – 2018-05-22 (×3): 20 mg via ORAL
  Filled 2018-05-19 (×3): qty 1

## 2018-05-19 MED ORDER — HYDROXYZINE HCL 25 MG PO TABS
25.0000 mg | ORAL_TABLET | Freq: Three times a day (TID) | ORAL | Status: DC | PRN
  Filled 2018-05-19: qty 1

## 2018-05-19 MED ORDER — POLYETHYLENE GLYCOL 3350 17 G PO PACK
17.0000 g | PACK | Freq: Every day | ORAL | Status: DC | PRN
  Administered 2018-05-20: 17 g via ORAL
  Filled 2018-05-19: qty 1

## 2018-05-19 MED ORDER — ENOXAPARIN SODIUM 40 MG/0.4ML ~~LOC~~ SOLN
40.0000 mg | SUBCUTANEOUS | Status: DC
  Administered 2018-05-19 – 2018-05-21 (×3): 40 mg via SUBCUTANEOUS
  Filled 2018-05-19 (×3): qty 0.4

## 2018-05-19 MED ORDER — LORATADINE 10 MG PO TABS
10.0000 mg | ORAL_TABLET | Freq: Every day | ORAL | Status: DC
  Administered 2018-05-19 – 2018-05-22 (×3): 10 mg via ORAL
  Filled 2018-05-19 (×4): qty 1

## 2018-05-19 MED ORDER — GUAIFENESIN-DM 100-10 MG/5ML PO SYRP
5.0000 mL | ORAL_SOLUTION | ORAL | Status: DC | PRN
  Filled 2018-05-19: qty 5

## 2018-05-19 MED ORDER — LEVOTHYROXINE SODIUM 50 MCG PO TABS
75.0000 ug | ORAL_TABLET | Freq: Every day | ORAL | Status: DC
  Administered 2018-05-20 – 2018-05-22 (×3): 75 ug via ORAL
  Filled 2018-05-19 (×3): qty 1

## 2018-05-19 MED ORDER — INSULIN ASPART 100 UNIT/ML ~~LOC~~ SOLN: 0.0000 [IU] | Freq: Every day | SUBCUTANEOUS | Status: DC

## 2018-05-19 MED ORDER — TRAMADOL HCL 50 MG PO TABS
50.0000 mg | ORAL_TABLET | Freq: Four times a day (QID) | ORAL | Status: DC | PRN
  Administered 2018-05-20: 06:00:00 50 mg via ORAL
  Filled 2018-05-19: qty 1

## 2018-05-19 MED ORDER — ALBUTEROL SULFATE (2.5 MG/3ML) 0.083% IN NEBU: 3.0000 mL | INHALATION_SOLUTION | RESPIRATORY_TRACT | Status: DC | PRN

## 2018-05-19 MED ORDER — TRIAMCINOLONE ACETONIDE 0.1 % EX OINT
1.0000 "application " | TOPICAL_OINTMENT | Freq: Two times a day (BID) | CUTANEOUS | Status: DC | PRN
  Filled 2018-05-19: qty 15

## 2018-05-19 MED ORDER — HYDROCODONE-ACETAMINOPHEN 5-325 MG PO TABS
1.0000 | ORAL_TABLET | Freq: Once | ORAL | Status: AC
  Administered 2018-05-19: 1 via ORAL
  Filled 2018-05-19: qty 1

## 2018-05-19 MED ORDER — CLONAZEPAM 0.25 MG PO TBDP
0.2500 mg | ORAL_TABLET | Freq: Three times a day (TID) | ORAL | Status: DC
  Administered 2018-05-19 – 2018-05-22 (×8): 0.25 mg via ORAL
  Filled 2018-05-19 (×9): qty 1

## 2018-05-19 MED ORDER — AMLODIPINE BESYLATE 10 MG PO TABS
10.0000 mg | ORAL_TABLET | Freq: Every day | ORAL | Status: DC
  Administered 2018-05-20 – 2018-05-22 (×3): 10 mg via ORAL
  Filled 2018-05-19 (×3): qty 1

## 2018-05-19 MED ORDER — PROMETHAZINE HCL 25 MG/ML IJ SOLN
12.5000 mg | Freq: Once | INTRAMUSCULAR | Status: AC
  Administered 2018-05-19: 12.5 mg via INTRAVENOUS
  Filled 2018-05-19: qty 1

## 2018-05-19 MED ORDER — IOPAMIDOL (ISOVUE-300) INJECTION 61%
30.0000 mL | Freq: Once | INTRAVENOUS | Status: AC | PRN
  Administered 2018-05-19: 30 mL via ORAL

## 2018-05-19 MED ORDER — PANTOPRAZOLE SODIUM 40 MG PO TBEC
40.0000 mg | DELAYED_RELEASE_TABLET | Freq: Every day | ORAL | Status: DC
  Administered 2018-05-19 – 2018-05-22 (×4): 40 mg via ORAL
  Filled 2018-05-19 (×4): qty 1

## 2018-05-19 MED ORDER — HYDROMORPHONE HCL 1 MG/ML IJ SOLN
1.0000 mg | INTRAMUSCULAR | Status: DC | PRN
  Administered 2018-05-19 – 2018-05-22 (×7): 1 mg via INTRAVENOUS
  Filled 2018-05-19 (×7): qty 1

## 2018-05-19 NOTE — Progress Notes (Signed)
Chaplain responded to an OR for an AD. Chaplain did an education and practiced active listen as Pt was in pain and said she takes a lot when in pain.    05/19/18 1700  Clinical Encounter Type  Visited With Patient  Visit Type Spiritual support  Spiritual Encounters  Spiritual Needs Brochure;Prayer;Emotional

## 2018-05-19 NOTE — ED Notes (Signed)
Lav and green tops sent at this time. Pt was informed we need urine, and was given sample container.

## 2018-05-19 NOTE — ED Notes (Signed)
This RN helped pt take off back brace to perform EKG. Pt crying in pain. PT tolerated EKG well and was reassured.

## 2018-05-19 NOTE — ED Notes (Signed)
Patient transported to CT 

## 2018-05-19 NOTE — Progress Notes (Signed)
Contacted hospitalist r/t patient's nausea and vomiting for additional antiemetic medication. See new order

## 2018-05-19 NOTE — Consult Note (Signed)
Pharmacy Antibiotic Note  Christine Nichols is a 62 y.o. female admitted on 05/19/2018 with pneumonia.  CT shows left pleural effusion and possible infection. She was recently admitted and treated for right sided PNA. WBC wnl. Pharmacy has been consulted for Levaquin dosing.  Plan: Levofloxacin 750mg  IV every 24 hours  Weight: 202 lb (91.6 kg)  Temp (24hrs), Avg:98.1 F (36.7 C), Min:98.1 F (36.7 C), Max:98.1 F (36.7 C)  Recent Labs  Lab 05/14/18 1448 05/19/18 1054  WBC 7.4 9.8  CREATININE  --  0.76    CrCl cannot be calculated (Unknown ideal weight.).    Allergies  Allergen Reactions  . Sulfa Antibiotics Anaphylaxis  . Sulfasalazine Anaphylaxis  . Codeine Nausea Only  . Lavender Oil     Sinus swelling, nausea  . Penicillins Rash    Has patient had a PCN reaction causing immediate rash, facial/tongue/throat swelling, SOB or lightheadedness with hypotension: Yes Has patient had a PCN reaction causing severe rash involving mucus membranes or skin necrosis: No Has patient had a PCN reaction that required hospitalization: No Has patient had a PCN reaction occurring within the last 10 years: No If all of the above answers are "NO", then may proceed with Cephalosporin use.    Antimicrobials this admission: levofloxacin 11/25 >>   Microbiology results: No recent results  Thank you for allowing pharmacy to be a part of this patient's care.  Dallie Piles, PharmD 05/19/2018 3:21 PM

## 2018-05-19 NOTE — H&P (Signed)
[facility] NAME: Christine Nichols    MR#:  [id number]  DATE OF BIRTH:  1956/06/10  DATE OF ADMISSION:  05/19/2018  PRIMARY CARE PHYSICIAN: Raymond Gurney   REQUESTING/REFERRING PHYSICIAN: dr Burlene Arnt  CHIEF COMPLAINT:   SOB, rib pain HISTORY OF PRESENT ILLNESS:  Christine Nichols  is a 62 y.o. female with a known history of COPD with recent hospital stay for severe constipation s/p colonoscopy with pathology consistent with tubular adenomas without dysplasia, PNA and acute left rib fracture due to coughing who presented to the ED with left sided rib pain. She was apparently doing well after her most recent hospital stay howevere on Friday she was getting up and pushed up on the back of her chair and heard a popping sound and developed left sided rib pain. The pain has gotten worse and she is having some SOB.  In the ED her CT scan shows Left eighth and ninth rib fractures are present. The left eighth rib fracture is minimally displaced. The left ninth rib fracture is displaced by 1 shaft width. There is focal soft tissue swelling along the chest wall. The left pleural effusion does not demonstrate hyperdense blood products. The effusion is likely reactive to the fractures without definite infection. Airspace disease most likely reflects atelectasis.  Her pain is 10/10 and only better with  pain medications.   PAST MEDICAL HISTORY:   Past Medical History:  Diagnosis Date  . Arthritis   . Asthma   . Bullous pemphigoid   . Collagen vascular disease (HCC)    Lupus  . Diabetes mellitus without complication ([place])   . Hypertension   . Lupus ([place])   . Neuropathy     PAST SURGICAL HISTORY:   Past Surgical History:  Procedure Laterality Date  . ABDOMINAL HYSTERECTOMY    . ABDOMINAL SURGERY    . COLONOSCOPY WITH PROPOFOL N/A 04/27/2018   Procedure: COLONOSCOPY WITH PROPOFOL;  Surgeon: Lucilla Lame, MD;  Location: [facility] ENDOSCOPY;   Service: Endoscopy;  Laterality: N/A;    SOCIAL HISTORY:   Social History   Tobacco Use  . Smoking status: Heavy Tobacco Smoker  . Smokeless tobacco: Never Used  Substance Use Topics  . Alcohol use: Never    Frequency: Never    FAMILY HISTORY:  No family history on file.  DRUG ALLERGIES:   Allergies  Allergen Reactions  . Sulfa Antibiotics Anaphylaxis  . Sulfasalazine Anaphylaxis  . Codeine Nausea Only  . Lavender Oil     Sinus swelling, nausea  . Penicillins Rash    Has patient had a PCN reaction causing immediate rash, facial/tongue/throat swelling, SOB or lightheadedness with hypotension: Yes Has patient had a PCN reaction causing severe rash involving mucus membranes or skin necrosis: No Has patient had a PCN reaction that required hospitalization: No Has patient had a PCN reaction occurring within the last 10 years: No If all of the above answers are "NO", then may proceed with Cephalosporin use.    REVIEW OF SYSTEMS:   Review of Systems  Constitutional: Negative.  Negative for chills, fever and malaise/fatigue.  HENT: Negative.  Negative for ear discharge, ear pain, hearing loss, nosebleeds and sore throat.   Eyes: Negative.  Negative for blurred vision and pain.  Respiratory: Positive for cough and shortness of breath. Negative for hemoptysis and wheezing.   Cardiovascular: Negative.  Negative for chest pain, palpitations and leg swelling.  Gastrointestinal: Negative.  Negative for abdominal pain, blood in  stool, diarrhea, nausea and vomiting.  Genitourinary: Negative.  Negative for dysuria.  Musculoskeletal: Negative.  Negative for back pain.       Rib pain left side  Skin: Negative.   Neurological: Negative for dizziness, tremors, speech change, focal weakness, seizures and headaches.  Endo/Heme/Allergies: Negative.  Does not bruise/bleed easily.  Psychiatric/Behavioral: Negative.  Negative for depression, hallucinations and suicidal ideas.    MEDICATIONS  AT HOME:   Prior to Admission medications   Medication Sig Start Date End Date Taking? Authorizing Provider  acetaminophen (TYLENOL) 500 MG tablet Take 500 mg by mouth every 6 (six) hours as needed for mild pain or moderate pain.   Yes [provider]  albuterol (PROVENTIL HFA;VENTOLIN HFA) 108 (90 Base) MCG/ACT inhaler Inhale 2 puffs into the lungs every 4 (four) hours as needed for wheezing or shortness of breath.    Yes [provider]  amLODipine (NORVASC) 5 MG tablet Take 10 mg by mouth daily.   Yes [provider]  cetirizine (ZYRTEC) 10 MG tablet Take 10 mg by mouth daily.   Yes [provider]  citalopram (CELEXA) 40 MG tablet Take 1 tablet by mouth daily. 10/10/17  Yes [provider]  diclofenac sodium (VOLTAREN) 1 % GEL APPLY 2 GRAMS TOPICALLY 4 TIMES DAILY 04/30/18  Yes [provider]  dimenhyDRINATE (DRAMAMINE) 50 MG tablet Take by mouth.   Yes [provider]  folic acid (FOLVITE) 1 MG tablet Take 1 mg by mouth daily.   Yes [provider]  hydrOXYzine (ATARAX/VISTARIL) 25 MG tablet Take 1 tablet (25 mg total) by mouth every 8 (eight) hours as needed for anxiety or itching. 10/24/17  Yes [name], Montell D, MD  levothyroxine (SYNTHROID, LEVOTHROID) 75 MCG tablet Take 75 mcg by mouth daily before breakfast.   Yes [provider]  metFORMIN (GLUCOPHAGE) 500 MG tablet TAKE 1 TABLET BY MOUTH ONCE DAILY FOR 2 WEEKS. THEN INCREASE TO ONE TABLET TWICE DAILY UNTIL SEEN AGAIN. MAY REFILL USING RX [id number]. 04/30/18  Yes [provider]  methotrexate (RHEUMATREX) 2.5 MG tablet Take 2.5 mg by mouth once a week. Caution:Chemotherapy. Protect from light.   Yes [provider]  omeprazole (PRILOSEC) 20 MG capsule Take 20 mg by mouth daily.   Yes [provider]  predniSONE (DELTASONE) 20 MG tablet Take 3 tablets (60 mg total) by mouth daily with breakfast. Patient taking differently: Take 40 mg by  mouth daily with breakfast.  10/25/17  Yes [name], Montell D, MD  triamcinolone ointment (KENALOG) 0.1 % Apply 1 application topically 2 (two) times daily.   Yes [provider]  vitamin B-12 (CYANOCOBALAMIN) 1000 MCG tablet Take by mouth.   Yes [provider]  benzonatate (TESSALON) 200 MG capsule Take 1 capsule (200 mg total) by mouth 3 (three) times daily as needed for cough. Patient not taking: Reported on 05/14/2018 04/28/18   Fritzi Mandes, MD  clonazepam (KLONOPIN) 0.25 MG disintegrating tablet Take 1 tablet (0.25 mg total) by mouth 3 (three) times daily. Patient not taking: Reported on 05/14/2018 04/28/18   Fritzi Mandes, MD  docusate sodium (COLACE) 100 MG capsule Take 1 tablet once or twice daily as needed for constipation while taking narcotic pain medicine 08/09/17   [name], MD  guaiFENesin-dextromethorphan [facility] DM) 100-10 MG/5ML syrup Take 5 mLs by mouth every 4 (four) hours as needed for cough. Patient not taking: Reported on 05/14/2018 04/28/18   Fritzi Mandes, MD      VITAL SIGNS:  Blood pressure (!) 152/99, pulse 70, temperature 98.1 F (36.7 C), temperature source Oral, resp. rate 19, weight 91.6 kg, SpO2 96 %.  PHYSICAL EXAMINATION:   Physical Exam  Constitutional: She is oriented to person, place, and time. She appears distressed (from pain).  HENT:  Head: Normocephalic.  Eyes: No scleral icterus.  Neck: Normal range of motion. Neck supple. No JVD present. No tracheal deviation present.  Cardiovascular: Normal rate, regular rhythm and normal heart sounds. Exam reveals no gallop and no friction rub.  No murmur heard. Pulmonary/Chest: Effort normal and breath sounds normal. No respiratory distress. She has no wheezes. She has no rales. She exhibits no tenderness.  Decreased breath sound left base  Abdominal: Soft. Bowel sounds are normal. She exhibits no distension and no mass. There is no tenderness. There is no rebound and no guarding.   Musculoskeletal: Normal range of motion. She exhibits no edema.  Neurological: She is alert and oriented to person, place, and time.  Skin: Skin is warm. No rash noted. No erythema.  Psychiatric: Judgment normal.      LABORATORY PANEL:   CBC Recent Labs  Lab 05/19/18 1054  WBC 9.8  HGB 11.8*  HCT 39.2  PLT 259   ------------------------------------------------------------------------------------------------------------------  Chemistries  Recent Labs  Lab 05/19/18 1054  NA 135  K 4.2  CL 99  CO2 26  GLUCOSE 100*  BUN 14  CREATININE 0.76  CALCIUM 9.2  AST 18  ALT 15  ALKPHOS 67  BILITOT 0.6   ------------------------------------------------------------------------------------------------------------------  Cardiac Enzymes No results for input(s): TROPONINI in the last 168 hours. ------------------------------------------------------------------------------------------------------------------  RADIOLOGY:  Ct Chest W Contrast  Addendum Date: 05/19/2018   ADDENDUM REPORT: 05/19/2018 14:44 ADDENDUM: Left eighth and ninth rib fractures are present. The left eighth rib fracture is minimally displaced. The left ninth rib fracture is displaced by 1 shaft width. There is focal soft tissue swelling along the chest wall. The left pleural effusion does not demonstrate hyperdense blood products. The effusion is likely reactive to the fractures without definite infection. Airspace disease most likely reflects atelectasis. Electronically Signed   By: San Morelle M.D.   On: 05/19/2018 14:44   Result Date: 05/19/2018 CLINICAL DATA:  Left-sided chest pain. Question rib fracture. Left-sided chest pain for 3 days. Recent admission to [facility]. Personal history of colon resection, cholecystectomy, hysterectomy, and hernia repair. EXAM: CT CHEST, ABDOMEN, AND PELVIS WITH CONTRAST TECHNIQUE: Multidetector CT imaging of the chest, abdomen and pelvis was  performed following the standard protocol during bolus administration of intravenous contrast. CONTRAST:  126mL ISOVUE-300 IOPAMIDOL (ISOVUE-300) INJECTION 61% COMPARISON:  One-view chest x-ray 04/25/2018. CT of the chest 04/20/2018. FINDINGS: CT CHEST FINDINGS Cardiovascular: Heart size is normal. Coronary artery calcifications are present. Mitral annular calcifications are present. Calcifications present at the aortic arch without aneurysm. Pulmonary arteries are within normal limits. No significant pericardial effusion is present. Mediastinum/Nodes: No significant mediastinal, hilar, or axillary adenopathy is present. Lungs/Pleura: Progressive left lower lobe airspace disease is present. A an increasing left pleural effusion is present. Previously seen right upper lobe airspace disease is improving. Chronic interstitial coarsening is otherwise stable. No significant right pleural effusion is present. Musculoskeletal: Vertebral body heights maintained. AP alignment is anatomic. Rightward curvature of the upper thoracic spine is again noted. There is fusion across the disc space at T5-6. CT ABDOMEN PELVIS FINDINGS Hepatobiliary: Mild intra and extrahepatic biliary dilation are present following cholecystectomy. No obstructing mass lesion is present. No other focal patent lesions  are present. Pancreas: Mild dilation of the pancreatic duct is present proximally. Tail is within normal limits. No mass lesion is evident. Spleen: Normal in size without focal abnormality. Adrenals/Urinary Tract: Adrenal glands within normal limits bilaterally. Kidneys and ureters are within normal limits. Bladder is unremarkable. Stomach/Bowel: Partial gastrectomy is noted. No stenosis is intact. Residual stomach is unremarkable. Small bowel is within normal limits. Adhesions are present proximally without obstruction. Terminal ileum is normal. Appendix is visualized and within normal limits. Ascending transverse colon are within normal  limits. Descending colon is normal. Diverticular changes are present in the sigmoid colon. Anastomosis is intact. No significant inflammation or obstruction is present. Vascular/Lymphatic: Atherosclerotic calcifications are present the abdominal aorta without aneurysm. Calcifications are present at the origins of the right renal artery. Thrombus is present in the portal vein. Portal vein is dilated. Thrombus is nonobstructive. Reproductive: Status post hysterectomy. No adnexal masses. Other: Postsurgical changes are present in the lower abdomen. No significant ventral hernias are present. There is no free fluid or free air. Musculoskeletal: Degenerative changes are most evident at L5-S1. Vertebral body heights alignment are normal. No focal lytic or blastic lesions are present. Pelvis is intact. Hips are located and normal. IMPRESSION: 1. 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 2. Left pleural effusion and associated airspace disease. While much this is atelectasis, infection is also considered. 3. Resolving right upper lobe infection. 4.  Aortic Atherosclerosis (ICD10-I70.0). 5. Coronary artery disease 6. Stable chronic dilation of the intra and extrahepatic biliary duct and pancreatic duct without obstructing lesion. 7. Previous bowel surgery without complication. 8. Sigmoid diverticulosis without diverticulitis. Electronically Signed: By: San Morelle M.D. On: 05/19/2018 14:24   Ct Abdomen Pelvis W Contrast  Addendum Date: 05/19/2018   ADDENDUM REPORT: 05/19/2018 14:44 ADDENDUM: Left eighth and ninth rib fractures are present. The left eighth rib fracture is minimally displaced. The left ninth rib fracture is displaced by 1 shaft width. There is focal soft tissue swelling along the chest wall. The left pleural effusion does not demonstrate hyperdense blood products. The effusion is likely reactive to the fractures without definite infection. Airspace disease most likely reflects  atelectasis. Electronically Signed   By: San Morelle M.D.   On: 05/19/2018 14:44   Result Date: 05/19/2018 CLINICAL DATA:  Left-sided chest pain. Question rib fracture. Left-sided chest pain for 3 days. Recent admission to [facility]. Personal history of colon resection, cholecystectomy, hysterectomy, and hernia repair. EXAM: CT CHEST, ABDOMEN, AND PELVIS WITH CONTRAST TECHNIQUE: Multidetector CT imaging of the chest, abdomen and pelvis was performed following the standard protocol during bolus administration of intravenous contrast. CONTRAST:  145mL ISOVUE-300 IOPAMIDOL (ISOVUE-300) INJECTION 61% COMPARISON:  One-view chest x-ray 04/25/2018. CT of the chest 04/20/2018. FINDINGS: CT CHEST FINDINGS Cardiovascular: Heart size is normal. Coronary artery calcifications are present. Mitral annular calcifications are present. Calcifications present at the aortic arch without aneurysm. Pulmonary arteries are within normal limits. No significant pericardial effusion is present. Mediastinum/Nodes: No significant mediastinal, hilar, or axillary adenopathy is present. Lungs/Pleura: Progressive left lower lobe airspace disease is present. A an increasing left pleural effusion is present. Previously seen right upper lobe airspace disease is improving. Chronic interstitial coarsening is otherwise stable. No significant right pleural effusion is present. Musculoskeletal: Vertebral body heights maintained. AP alignment is anatomic. Rightward curvature of the upper thoracic spine is again noted. There is fusion across the disc space at T5-6. CT ABDOMEN PELVIS FINDINGS Hepatobiliary: Mild intra and extrahepatic biliary dilation are present  following cholecystectomy. No obstructing mass lesion is present. No other focal patent lesions are present. Pancreas: Mild dilation of the pancreatic duct is present proximally. Tail is within normal limits. No mass lesion is evident. Spleen: Normal in size  without focal abnormality. Adrenals/Urinary Tract: Adrenal glands within normal limits bilaterally. Kidneys and ureters are within normal limits. Bladder is unremarkable. Stomach/Bowel: Partial gastrectomy is noted. No stenosis is intact. Residual stomach is unremarkable. Small bowel is within normal limits. Adhesions are present proximally without obstruction. Terminal ileum is normal. Appendix is visualized and within normal limits. Ascending transverse colon are within normal limits. Descending colon is normal. Diverticular changes are present in the sigmoid colon. Anastomosis is intact. No significant inflammation or obstruction is present. Vascular/Lymphatic: Atherosclerotic calcifications are present the abdominal aorta without aneurysm. Calcifications are present at the origins of the right renal artery. Thrombus is present in the portal vein. Portal vein is dilated. Thrombus is nonobstructive. Reproductive: Status post hysterectomy. No adnexal masses. Other: Postsurgical changes are present in the lower abdomen. No significant ventral hernias are present. There is no free fluid or free air. Musculoskeletal: Degenerative changes are most evident at L5-S1. Vertebral body heights alignment are normal. No focal lytic or blastic lesions are present. Pelvis is intact. Hips are located and normal. IMPRESSION: 1. 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 2. Left pleural effusion and associated airspace disease. While much this is atelectasis, infection is also considered. 3. Resolving right upper lobe infection. 4.  Aortic Atherosclerosis (ICD10-I70.0). 5. Coronary artery disease 6. Stable chronic dilation of the intra and extrahepatic biliary duct and pancreatic duct without obstructing lesion. 7. Previous bowel surgery without complication. 8. Sigmoid diverticulosis without diverticulitis. Electronically Signed: By: San Morelle M.D. On: 05/19/2018 14:24    EKG:   Orders placed or  performed during the hospital encounter of 05/19/18  . ED EKG  . ED EKG    IMPRESSION AND PLAN:    62 year old female with recent hospitalization for pneumonia, constipation requiring colonoscopy and rib fracture after coughing who was doing well up until Friday when she developed acute onset of left-sided rib pain.  1.  Acute left eighth and ninth rib fracture pleural effusion Continue pain medications with IV and oral Thoracentesis for pleural effusion with labs ISS ordered I suspect as patient is on steroids this may be etiology of her fracture.  2.  Suspected pneumonia from atelectasis/rib fracture: Start Levaquin  3.  Diabetes: Start ADA diet with sliding scale Diabetes nurse consult requested   4.  Hypothyroidism: Continue Synthroid   5.  Essential hypertension: Continue Norvasc   6.  Depression: Continue Celexa  7.  Bullous pemphigoid: Continue prednisone 20 mg.  Methotrexate was taken on Saturday  all the records are reviewed and case discussed with ED provider. Management plans discussed with the patient and she is in agreement  CODE STATUS: Full  TOTAL TIME TAKING CARE OF THIS PATIENT: 43 minutes.    Christine Nichols M.D on 05/19/2018 at 3:11 PM  Between 7am to 6pm - Pager - [phone]  After 6pm go to [email or web address] - password [email or web address] [facility]  Office  [phone]  CC: Primary care physician; System, Pcp Not In

## 2018-05-19 NOTE — ED Triage Notes (Signed)
Pt presents via POV with family member for abd pain. Pt states she has :busted guts: As she is recovering from PNA she has rib pain from coughing and that when she moved Friday night she felt like something busted in there. Pt states she has a belt on around the area and screams if taken off.

## 2018-05-19 NOTE — ED Provider Notes (Addendum)
[facility] Emergency Department Provider Note  ____________________________________________   I have reviewed the triage vital signs and the nursing notes. Where available I have reviewed prior notes and, if possible and indicated, outside hospital notes.    HISTORY  Chief Complaint Abdominal Pain    HPI Christine Nichols is a 62 y.o. female  With a recent history of colonic obstruction, treated conservatively, as well as a recent pneumonia and recent broken rib, discharged from this hospital on 11 4.  [name], she felt immediate pain to the area where she had previously had a rib on the left rib cage, and had immediate pain also to her upper abdomen.  She states the pain is significant.  Does not radiate.  She feels that she "busted a got".  She states that she has had no vomiting, as she did have some mildly loose stools, but no melena no bright red blood per rectum and no other obstruction symptoms, she denies any fever or chills.  She did not have any shortness of breath.  The pain is there all the times.  Better somewhat when she puts a truss on which she has fashioned out of 1 of her husband's work belts.  She states that she has a lot of left upper quadrant abdominal pain.  This is been going on for the last 3 days.  To be getting worse and not better so she came in. Tylenol at home with minimal relief of symptoms. Complaint of trouble breathing except for the fact that it is uncomfortable when her rib moves also worse when she moves around has been eating and drinking and taking p.o. with no difficulty she states.  Past Medical History:  Diagnosis Date  . Arthritis   . Bullous pemphigoid   . Diabetes mellitus without complication ([place])   . Hypertension   . Lupus ([place])   . Neuropathy     Patient Active Problem List   Diagnosis Date Noted  . Abnormal CT scan, sigmoid colon   . Polyp of sigmoid colon   . Benign neoplasm of ascending colon   . Benign neoplasm of cecum   . 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 Large bowel obstruction ([place]) 04/20/2018  . Chronic hepatitis C without hepatic coma ([place])   . Varicella 10/16/2017    Past Surgical History:  Procedure Laterality Date  . ABDOMINAL HYSTERECTOMY    . ABDOMINAL SURGERY    . COLONOSCOPY WITH PROPOFOL N/A 04/27/2018   Procedure: COLONOSCOPY WITH PROPOFOL;  Surgeon: Lucilla Lame, MD;  Location: [facility] ENDOSCOPY;  Service: Endoscopy;  Laterality: N/A;    Prior to Admission medications   Medication Sig Start Date End Date Taking? Authorizing Provider  acetaminophen (TYLENOL) 500 MG tablet Take 500 mg by mouth every 6 (six) hours as needed for mild pain or moderate pain.   Yes [provider]  albuterol (PROVENTIL HFA;VENTOLIN HFA) 108 (90 Base) MCG/ACT inhaler Inhale 2 puffs into the lungs every 4 (four) hours as needed for wheezing or shortness of breath.    Yes [provider]  amLODipine (NORVASC) 5 MG tablet Take 10 mg by mouth daily.   Yes [provider]  cetirizine (ZYRTEC) 10 MG tablet Take 10 mg by mouth daily.   Yes [provider]  citalopram (CELEXA) 40 MG tablet Take 1 tablet by mouth daily. 10/10/17  Yes [provider]  diclofenac sodium (VOLTAREN) 1 % GEL APPLY 2 GRAMS TOPICALLY 4 TIMES DAILY 04/30/18  Yes [provider]  dimenhyDRINATE (DRAMAMINE) 50 MG tablet Take by mouth.   Yes [provider]  folic acid (FOLVITE) 1 MG tablet Take 1 mg by mouth daily.   Yes [provider]  hydrOXYzine (ATARAX/VISTARIL) 25 MG tablet Take 1 tablet (25 mg total) by mouth every 8 (eight) hours as needed for anxiety or itching. 10/24/17  Yes [name], Christine D, MD  levothyroxine (SYNTHROID, LEVOTHROID) 75 MCG tablet Take 75 mcg by mouth daily  before breakfast.   Yes [provider]  metFORMIN (GLUCOPHAGE) 500 MG tablet TAKE 1 TABLET BY MOUTH ONCE DAILY FOR 2 WEEKS. THEN INCREASE TO ONE TABLET TWICE DAILY UNTIL SEEN AGAIN. MAY REFILL USING RX [id number]. 04/30/18  Yes [provider]  methotrexate (RHEUMATREX) 2.5 MG tablet Take 2.5 mg by mouth once a week. Caution:Chemotherapy. Protect from light.   Yes [provider]  omeprazole (PRILOSEC) 20 MG capsule Take 20 mg by mouth daily.   Yes [provider]  predniSONE (DELTASONE) 20 MG tablet Take 3 tablets (60 mg total) by mouth daily with breakfast. Patient taking differently: Take 40 mg by mouth daily with breakfast.  10/25/17  Yes [name], Christine D, MD  triamcinolone ointment (KENALOG) 0.1 % Apply 1 application topically 2 (two) times daily.   Yes [provider]  vitamin B-12 (CYANOCOBALAMIN) 1000 MCG tablet Take by mouth.   Yes [provider]  benzonatate (TESSALON) 200 MG capsule Take 1 capsule (200 mg total) by mouth 3 (three) times daily as needed for cough. Patient not taking: Reported on 05/14/2018 04/28/18   Christine Mandes, MD  clonazepam (KLONOPIN) 0.25 MG disintegrating tablet Take 1 tablet (0.25 mg total) by mouth 3 (three) times daily. Patient not taking: Reported on 05/14/2018 04/28/18   Christine Mandes, MD  docusate sodium (COLACE) 100 MG capsule Take 1 tablet once or twice daily as needed for constipation while taking narcotic pain medicine 08/09/17   [name], MD  guaiFENesin-dextromethorphan [facility] DM) 100-10 MG/5ML syrup Take 5 mLs by mouth every 4 (four) hours as needed for cough. Patient not taking: Reported on 05/14/2018 04/28/18   Christine Mandes, MD    Allergies Sulfa antibiotics; Sulfasalazine; Codeine; Lavender oil; and Penicillins  No family history on file.  Social History Social History   Tobacco Use  . Smoking status: Heavy Tobacco Smoker  . Smokeless tobacco: Never Used  Substance Use Topics  . Alcohol  use: Never    Frequency: Never  . Drug use: Never    Review of Systems Constitutional: No fever/chills Eyes: No visual changes. ENT: No sore throat. No stiff neck no neck pain Cardiovascular: Denies chest pain. Respiratory: Denies shortness of breath. Gastrointestinal:   no vomiting.  No diarrhea.  No constipation. Genitourinary: Negative for dysuria. Musculoskeletal: Negative lower extremity swelling Skin: Negative for rash. Neurological: Negative for severe headaches, focal weakness or numbness.   ____________________________________________   PHYSICAL EXAM:  VITAL SIGNS: ED Triage Vitals  Enc Vitals Group     BP 05/19/18 1052 132/85     Pulse Rate 05/19/18 1052 74     Resp --      Temp 05/19/18 1052 98.1 F (36.7 C)     Temp Source 05/19/18 1052 Oral     SpO2 05/19/18 1052 94 %  Weight 05/19/18 1053 202 lb (91.6 kg)     Height --      Head Circumference --      Peak Flow --      Pain Score 05/19/18 1052 10     Pain Loc --      Pain Edu? --      Excl. in [place]? --     Constitutional: Alert and oriented.  Somewhat uncomfortable but nontoxic Eyes: Conjunctivae are normal Head: Atraumatic HEENT: No congestion/rhinnorhea. Mucous membranes are moist.  Oropharynx non-erythematous Neck:   Nontender with no meningismus, no masses, no stridor Cardiovascular: Normal rate, regular rhythm. Grossly normal heart sounds.  Good peripheral circulation. Respiratory: Normal respiratory effort.  No retractions. Lungs CTAB. Test:: Tender to palpation to the left chest wall when I touch this area, patient states "that is the pain right there pulls back. Abdominal: Soft and has a significant left upper quadrant abdominal pain, no lower abdominal discomfort.  Voluntary guarding no rebound. No distention.  Back:  There is no focal tenderness or step off.  there is no midline tenderness there are no lesions noted. there is no CVA tenderness  Musculoskeletal: No lower extremity  tenderness, no upper extremity tenderness. No joint effusions, no DVT signs strong distal pulses no edema Neurologic:  Normal speech and language. No gross focal neurologic deficits are appreciated.  Skin:  Skin is warm, dry and intact. No rash noted. Psychiatric: Mood and affect are normal. Speech and behavior are normal.  ____________________________________________   LABS (all labs ordered are listed, but only abnormal results are displayed)  Labs Reviewed  COMPREHENSIVE METABOLIC PANEL - Abnormal; Notable for the following components:      Result Value   Glucose, Bld 100 (*)    All other components within normal limits  CBC - Abnormal; Notable for the following components:   Hemoglobin 11.8 (*)    MCH 25.3 (*)    RDW 26.5 (*)    All other components within normal limits  URINALYSIS, COMPLETE (UACMP) WITH MICROSCOPIC - Abnormal; Notable for the following components:   Color, Urine YELLOW (*)    APPearance CLEAR (*)    Hgb urine dipstick SMALL (*)    All other components within normal limits  LIPASE, BLOOD    Pertinent labs  results that were available during my care of the patient were reviewed by me and considered in my medical decision making (see chart for details). ____________________________________________  EKG  I personally interpreted any EKGs ordered by me or triage 273 bpm no acute ST elevation or depression normal axis unremarkable EKG ____________________________________________  RADIOLOGY  Pertinent labs & imaging results that were available during my care of the patient were reviewed by me and considered in my medical decision making (see chart for details). If possible, patient and/or family made aware of any abnormal findings.  No results found. ____________________________________________    PROCEDURES  Procedure(s) performed: None  Procedures  Critical Care performed: None  ____________________________________________   INITIAL IMPRESSION  / ASSESSMENT AND PLAN / ED COURSE  Pertinent labs & imaging results that were available during my care of the patient were reviewed by me and considered in my medical decision making (see chart for details).  Patient here after a comp gated recent admission for pneumonia, rib fracture and colonic obstruction presents today complaining of significant upper abdominal pain and rib pain after moving awkwardly at home.  Certainly some of this could be from the rib but at this time, she  also is having significant abdominal pain given her complicated history, I think a CT scan is going to be the best option for figuring out what is going on.  We will give her pain control, IV fluids and will obtain CT chest abdomen pelvis to further evaluate the severe pain the patient is describing.  She feels that she has a "hernia".  It is difficult for me to tell if that is true, patient does suffer from some degree of obesity, and the suddenness of her abdominal exam is difficult to tease out therefore.  Low suspicion for significant splenic injury fortunately, which can happen with rib fractures, patient had this happen to her 3days ago and her hemoglobin is prior.  Blood work is reassuring thus far, we will see what imaging shows.  Suspicion for ACS PE or dissection for this very reproducible pain that started with a "pop" several days ago, we will, however, obtain EKG as a screening tool  ----------------------------------------- 3:06 PM on 05/19/2018 -----------------------------------------  CT scan shows a pleural effusion which according to radiology does not appear to be the same hospital units as blood, there is no active extravasation, and 2 rib fractures.  Patient is requiring significant pain control to stop her from splinting and I think she is at significant risk of a repeat pneumonia if I send her home, I think would also be of utility to get the pleural effusion drained for comfort as well as diagnostic  purposes.  I do not think she needs an emergent chest tube.  Her hemoglobin is stable, it does not appear to be active bleeding on CT scan does not appear to be blood on CT scan and even if it were blood from 3 days ago, it would likely not benefit from emergent tube thoracotomy in the emergency department the patient is holding her sats well but I do believe she should be admitted for further observation and pain control and she agrees.  Discussed with Dr. Genia Harold who accepts him to her service    ____________________________________________   FINAL CLINICAL IMPRESSION(S) / ED DIAGNOSES  Final diagnoses:  None      This chart was dictated using voice recognition software.  Despite best efforts to proofread,  errors can occur which can change meaning.      Schuyler Amor, MD 05/19/18 1245    Schuyler Amor, MD 05/19/18 [phone]

## 2018-05-20 ENCOUNTER — Inpatient Hospital Stay: Payer: Medicaid Other

## 2018-05-20 LAB — BASIC METABOLIC PANEL
Anion gap: 7 (ref 5–15)
BUN: 13 mg/dL (ref 8–23)
CHLORIDE: 97 mmol/L — AB (ref 98–111)
CO2: 30 mmol/L (ref 22–32)
CREATININE: 0.77 mg/dL (ref 0.44–1.00)
Calcium: 8.9 mg/dL (ref 8.9–10.3)
GFR calc non Af Amer: >60 mL/min (ref >60)
GLUCOSE: 86 mg/dL (ref 70–99)
Potassium: 4 mmol/L (ref 3.5–5.1)
Sodium: 134 mmol/L — ABNORMAL LOW (ref 135–145)

## 2018-05-20 LAB — BODY FLUID CELL COUNT WITH DIFFERENTIAL
Eos, Fluid: 0 %
LYMPHS FL: 31 %
MONOCYTE-MACROPHAGE-SEROUS FLUID: 11 %
NEUTROPHIL FLUID: 58 %
WBC FLUID: 1842 uL

## 2018-05-20 LAB — LACTATE DEHYDROGENASE, PLEURAL OR PERITONEAL FLUID: LD FL: 119 U/L — AB (ref 3–23)

## 2018-05-20 LAB — GLUCOSE, CAPILLARY
GLUCOSE-CAPILLARY: 112 mg/dL — AB (ref 70–99)
GLUCOSE-CAPILLARY: 133 mg/dL — AB (ref 70–99)
Glucose-Capillary: 87 mg/dL (ref 70–99)
Glucose-Capillary: 114 mg/dL — ABNORMAL HIGH (ref 70–99)

## 2018-05-20 LAB — CBC
HEMATOCRIT: 35.9 % — AB (ref 36.0–46.0)
Hemoglobin: 10.6 g/dL — ABNORMAL LOW (ref 12.0–15.0)
MCH: 25.3 pg — ABNORMAL LOW (ref 26.0–34.0)
MCHC: 29.5 g/dL — ABNORMAL LOW (ref 30.0–36.0)
MCV: 85.7 fL (ref 80.0–100.0)
NRBC: 0 % (ref 0.0–0.2)
Platelets: 233 10*3/uL (ref 150–400)
RBC: 4.19 MIL/uL (ref 3.87–5.11)
RDW: 25.9 % — AB (ref 11.5–15.5)
WBC: 5.6 10*3/uL (ref 4.0–10.5)

## 2018-05-20 LAB — GRAM STAIN

## 2018-05-20 LAB — AMYLASE, PLEURAL OR PERITONEAL FLUID: Amylase, Fluid: 42 U/L

## 2018-05-20 LAB — PATHOLOGIST SMEAR REVIEW

## 2018-05-20 LAB — PROTEIN, PLEURAL OR PERITONEAL FLUID: TOTAL PROTEIN, FLUID: 3.7 g/dL

## 2018-05-20 LAB — ALBUMIN, PLEURAL OR PERITONEAL FLUID: Albumin, Fluid: 2.5 g/dL

## 2018-05-20 MED ORDER — NICOTINE 21 MG/24HR TD PT24
21.0000 mg | MEDICATED_PATCH | Freq: Every day | TRANSDERMAL | Status: DC
  Administered 2018-05-20 – 2018-05-22 (×3): 21 mg via TRANSDERMAL
  Filled 2018-05-20 (×4): qty 1

## 2018-05-20 MED ORDER — ACETAMINOPHEN 500 MG PO TABS
500.0000 mg | ORAL_TABLET | Freq: Four times a day (QID) | ORAL | Status: DC | PRN
  Administered 2018-05-20: 12:00:00 500 mg via ORAL
  Filled 2018-05-20: qty 1

## 2018-05-20 MED ORDER — OXYCODONE HCL 5 MG PO TABS
10.0000 mg | ORAL_TABLET | Freq: Four times a day (QID) | ORAL | Status: DC | PRN
  Administered 2018-05-20 – 2018-05-22 (×6): 10 mg via ORAL
  Filled 2018-05-20 (×7): qty 2

## 2018-05-20 MED ORDER — POLYETHYLENE GLYCOL 3350 17 G PO PACK
17.0000 g | PACK | Freq: Every day | ORAL | Status: DC
  Administered 2018-05-21 – 2018-05-22 (×2): 17 g via ORAL
  Filled 2018-05-20 (×2): qty 1

## 2018-05-20 MED ORDER — FOLIC ACID 1 MG PO TABS
1.0000 mg | ORAL_TABLET | Freq: Every day | ORAL | Status: DC
  Administered 2018-05-21 – 2018-05-22 (×2): 1 mg via ORAL
  Filled 2018-05-20 (×2): qty 1

## 2018-05-20 MED ORDER — GABAPENTIN 400 MG PO CAPS
400.0000 mg | ORAL_CAPSULE | Freq: Three times a day (TID) | ORAL | Status: DC
  Administered 2018-05-20 – 2018-05-22 (×7): 400 mg via ORAL
  Filled 2018-05-20: qty 1
  Filled 2018-05-20: qty 4
  Filled 2018-05-20 (×3): qty 1
  Filled 2018-05-20 (×3): qty 4
  Filled 2018-05-20 (×5): qty 1
  Filled 2018-05-20: qty 4

## 2018-05-20 MED ORDER — LEVOFLOXACIN IN D5W 750 MG/150ML IV SOLN
750.0000 mg | INTRAVENOUS | Status: AC
  Administered 2018-05-20: 750 mg via INTRAVENOUS
  Filled 2018-05-20: qty 150

## 2018-05-20 MED ORDER — LEVOFLOXACIN 750 MG PO TABS
750.0000 mg | ORAL_TABLET | Freq: Every day | ORAL | Status: DC
  Administered 2018-05-21 – 2018-05-22 (×2): 750 mg via ORAL
  Filled 2018-05-20 (×2): qty 1

## 2018-05-20 MED ORDER — DOCUSATE SODIUM 100 MG PO CAPS
100.0000 mg | ORAL_CAPSULE | Freq: Two times a day (BID) | ORAL | Status: DC
  Administered 2018-05-20 – 2018-05-22 (×5): 100 mg via ORAL
  Filled 2018-05-20 (×5): qty 1

## 2018-05-20 NOTE — Progress Notes (Signed)
Advanced care plan.  Purpose of the Encounter: CODE STATUS  Parties in Attendance: Patient  Patient's Decision Capacity: Good  Subjective/Patient's story: Presented to hospital for difficulty breathing and left rib cage pain   Objective/Medical story Has eighth and ninth left rib fractures along with pleural effusion Needs interventional radiology evaluation for thoracentesis Pain management   Goals of care determination:  Advance care directives goals of care and treatment plan discussed Patient wants everything done which includes CPR, intubation ventilator if the need arises   CODE STATUS: Full code   Time spent discussing advanced care planning: 16 minutes

## 2018-05-20 NOTE — Care Management (Signed)
Patient presents from home with abdominal pain.  Found to have fractured ribs with displacement. Pleural effusion with thoracentesis today. Unable to find results of amount of fluid re,mpoved.  Patient lives with her husband .  PCP is Dr Reeves Dam at [facility]. She is currently open room air.  Is currently open to Advanced RN and PT.  Notified Advanced of admission. Family will transport home at discharge.  Physical therapy has seen and recommending home heatlh

## 2018-05-20 NOTE — Progress Notes (Signed)
PT Cancellation Note  Patient Details Name: Christine Nichols MRN: [id number] DOB: 13-May-1956   Cancelled Treatment:    Reason Eval/Treat Not Completed: Other (comment);Patient declined, no reason specified(Patient requesting PT return after she finishes her lunch. PT will follow as able.)  Lieutenant Diego PT, DPT 1:13 PM,05/20/18 [phone]

## 2018-05-20 NOTE — Progress Notes (Signed)
Patient refuses bed alarm. Educated about safety. Patient states understanding and she will call for assistance.

## 2018-05-20 NOTE — Plan of Care (Signed)
  Problem: Health Behavior/Discharge Planning: Goal: Ability to manage health-related needs will improve Outcome: Progressing   Problem: Clinical Measurements: Goal: Ability to maintain clinical measurements within normal limits will improve Outcome: Progressing   Problem: Pain Managment: Goal: General experience of comfort will improve Outcome: Progressing   Problem: Safety: Goal: Ability to remain free from injury will improve Outcome: Progressing   Problem: Skin Integrity: Goal: Risk for impaired skin integrity will decrease Outcome: Progressing   

## 2018-05-20 NOTE — Progress Notes (Signed)
Inpatient Diabetes Program Recommendations  AACE/ADA: New Consensus Statement on Inpatient Glycemic Control (2019)  Target Ranges:  Prepandial:   less than 140 mg/dL      Peak postprandial:   less than 180 mg/dL (1-2 hours)      Critically ill patients:  140 - 180 mg/dL   Results for Christine Nichols, Christine Nichols (MRN [id number]) as of 05/20/2018 08:48  Ref. Range 05/19/2018 16:34 05/19/2018 21:01 05/20/2018 07:44  Glucose-Capillary Latest Ref Range: 70 - 99 mg/dL 116 (H) 99 87   Review of Glycemic Control  Diabetes history: DM2 Outpatient Diabetes medications: Metformin 500 mg BID Current orders for Inpatient glycemic control: Novolog 0-9 units TID with meals, Novolog 0-5 units QHs; Prednisone 20 mg QAM  Inpatient Diabetes Program Recommendations:  HbgA1C: Please consider ordering an A1C to evaluate glycemic control over the past 2-3 months.  NOTE: Noted consult for Diabetes Coordinator. Chart reviewed. CBGs trending well at this time. Will continue to follow along.  Thanks, Barnie Alderman, RN, MSN, CDE Diabetes Coordinator Inpatient Diabetes Program [phone] (Team Pager from 8am to 5pm)

## 2018-05-20 NOTE — Evaluation (Signed)
Physical Therapy Evaluation Patient Details Name: Christine Nichols MRN: [id number] DOB: 12-25-1955 Today's Date: 05/20/2018   History of Present Illness  Pt is 62 yo female presented to ED for L sided R pain. 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 PMH includes Bullous pemphigoid, DM, htn, lupus, neuropathy, chronic hep C, varicella, abdominal surgery (x13), and COPD. Imaging showed Left eighth and ninth rib fractures with displacement present.   Clinical Impression  Patient A&Ox4 at start of session, complaining of moderate L sided rib pain. Patient reports living in 1 story home with 6 STE with husband available to assist as needed at discharge. Ambulates with RW in home, has been receiving home health to progress to PLOF; currently needs assistance with IADLs.  Patient demonstrated bed mobility mod I, transferred with RW and CGA, increased time needed for proper hand placement and effort. Ambulated ~137ft with RW and CGA, intermittent standing rest breaks needed due to pt reported nausea, spO2 and HR stable. Overall, gait WFLs, though gait velocity decreased and pt complained of fatigue at end of mobility. The patient would benefit from skilled PT to continue to progress to prior independent PLOF.       Follow Up Recommendations Home health PT;Other (comment)(Pt already has HHPT set up)    Equipment Recommendations  Rolling walker with 5" wheels    Recommendations for Other Services       Precautions / Restrictions Precautions Precautions: Fall Restrictions Weight Bearing Restrictions: No      Mobility  Bed Mobility Overal bed mobility: Modified Independent                Transfers Overall transfer level: Needs assistance Equipment used: Rolling walker (2 wheeled) Transfers: Sit to/from Stand Sit to Stand: Min guard            Ambulation/Gait Ambulation/Gait assistance: Min guard Gait Distance (Feet): 180  Feet Assistive device: Rolling walker (2 wheeled)   Gait velocity: fluctuating gait speed during ambulation   General Gait Details: WFLs, pt limited by nausea during ambulation, no complaints of lightheadedness, dizziness, occasional standing rest breaks needed.   Stairs            Wheelchair Mobility    Modified Rankin (Stroke Patients Only)       Balance Overall balance assessment: Needs assistance Sitting-balance support: Feet supported Sitting balance-Leahy Scale: Normal       Standing balance-Leahy Scale: Good                               Pertinent Vitals/Pain Pain Assessment: Faces Faces Pain Scale: Hurts even more Pain Location: L sided, Ribs Pain Intervention(s): Limited activity within patient's tolerance;Monitored during session;Repositioned    Home Living Family/patient expects to be discharged to:: Private residence Living Arrangements: Spouse/significant other Available Help at Discharge: Family Type of Home: Mobile home Home Access: Stairs to enter Entrance Stairs-Rails: Right Entrance Stairs-Number of Steps: 4 Home Layout: One level Home Equipment: Toilet riser;Walker - 2 wheels;Bedside commode;Grab bars - tub/shower      Prior Function Level of Independence: Needs assistance   Gait / Transfers Assistance Needed: Patient uses RW at home for ambulation, currently recieving home health  ADL's / Homemaking Assistance Needed: no assist needed with ADLs, husband currently performing IADLs  Comments: Patient wants to return to PLOF (driving, improved ability to walk)     Hand Dominance  Extremity/Trunk Assessment   Upper Extremity Assessment Upper Extremity Assessment: Generalized weakness    Lower Extremity Assessment Lower Extremity Assessment: RLE deficits/detail;LLE deficits/detail;Generalized weakness RLE Deficits / Details: grossly 4/5, 3+/5 for hip abduction/adduction in sitting LLE Deficits / Details: grossly  4/5, 3+/5 for hip abduction/adduction in sitting       Communication   Communication: No difficulties  Cognition Arousal/Alertness: Awake/alert Behavior During Therapy: WFL for tasks assessed/performed Overall Cognitive Status: Within Functional Limits for tasks assessed                                        General Comments      Exercises     Assessment/Plan    PT Assessment Patient needs continued PT services  PT Problem List Decreased strength;Decreased activity tolerance;Decreased balance;Decreased mobility;Decreased knowledge of use of DME;Decreased knowledge of precautions;Pain       PT Treatment Interventions DME instruction;Gait training;Stair training;Functional mobility training;Therapeutic activities;Therapeutic exercise;Balance training;Patient/family education;Neuromuscular re-education    PT Goals (Current goals can be found in the Care Plan section)  Acute Rehab PT Goals Patient Stated Goal: to return to PLOF PT Goal Formulation: With patient Time For Goal Achievement: 06/03/18 Potential to Achieve Goals: Good    Frequency Min 2X/week   Barriers to discharge        Co-evaluation               AM-PAC PT "6 Clicks" Mobility  Outcome Measure Help needed turning from your back to your side while in a flat bed without using bedrails?: A Little Help needed moving from lying on your back to sitting on the side of a flat bed without using bedrails?: A Little Help needed moving to and from a bed to a chair (including a wheelchair)?: A Little Help needed standing up from a chair using your arms (e.g., wheelchair or bedside chair)?: A Little Help needed to walk in hospital room?: A Little Help needed climbing 3-5 steps with a railing? : A Little 6 Click Score: 18    End of Session Equipment Utilized During Treatment: Gait belt Activity Tolerance: Patient tolerated treatment well;Patient limited by pain Patient left: in bed;with call  bell/phone within reach;with bed alarm set Nurse Communication: Mobility status PT Visit Diagnosis: Other abnormalities of gait and mobility (R26.89);Muscle weakness (generalized) (M62.81);History of falling (Z91.81);Difficulty in walking, not elsewhere classified (R26.2)    Time: 9470-9628 PT Time Calculation (min) (ACUTE ONLY): 25 min   Charges:   PT Evaluation $PT Eval Low Complexity: 1 Low PT Treatments $Therapeutic Activity: 8-22 mins        Lieutenant Diego PT, DPT 2:58 PM,05/20/18 [phone]

## 2018-05-20 NOTE — Progress Notes (Addendum)
Christine Nichols at [place] NAME: Christine Nichols    MR#:  [id number]  DATE OF BIRTH:  1955-10-09  SUBJECTIVE:  CHIEF COMPLAINT:   Chief Complaint  Patient presents with  . Abdominal Pain  Patient seen and evaluated today Has discomfort in the posterior part of the left chest Able to take deep breaths because of the pain in the chest wall Patient also wearing a lumbar corset for low back pain  REVIEW OF SYSTEMS:    ROS  CONSTITUTIONAL: No documented fever. No fatigue, weakness. No weight gain, no weight loss.  EYES: No blurry or double vision.  ENT: No tinnitus. No postnasal drip. No redness of the oropharynx.  RESPIRATORY: Occasional cough, no wheeze, no hemoptysis. No dyspnea.  CARDIOVASCULAR: Left chest wall pain. No orthopnea. No palpitations. No syncope.  GASTROINTESTINAL: No nausea, no vomiting or diarrhea. No abdominal pain. No melena or hematochezia.  GENITOURINARY: No dysuria or hematuria.  ENDOCRINE: No polyuria or nocturia. No heat or cold intolerance.  HEMATOLOGY: No anemia. No bruising. No bleeding.  INTEGUMENTARY: No rashes. No lesions.  MUSCULOSKELETAL: No arthritis. No swelling. No gout.  NEUROLOGIC: No numbness, tingling, or ataxia. No seizure-type activity.  PSYCHIATRIC: No anxiety. No insomnia. No ADD.   DRUG ALLERGIES:   Allergies  Allergen Reactions  . Sulfa Antibiotics Anaphylaxis  . Sulfasalazine Anaphylaxis  . Codeine Nausea Only  . Lavender Oil     Sinus swelling, nausea  . Penicillins Rash    Has patient had a PCN reaction causing immediate rash, facial/tongue/throat swelling, SOB or lightheadedness with hypotension: Yes Has patient had a PCN reaction causing severe rash involving mucus membranes or skin necrosis: No Has patient had a PCN reaction that required hospitalization: No Has patient had a PCN reaction occurring within the last 10 years: No If all of the above answers are "NO", then may proceed with  Cephalosporin use.    VITALS:  Blood pressure 140/86, pulse 75, temperature 97.8 F (36.6 C), temperature source Oral, resp. rate 18, height 5\' 6"  (1.676 m), weight 95 kg, SpO2 94 %.  PHYSICAL EXAMINATION:   Physical Exam  GENERAL:  62 y.o.-year-old patient lying in the bed with no acute distress.  EYES: Pupils equal, round, reactive to light and accommodation. No scleral icterus. Extraocular muscles intact.  HEENT: Head atraumatic, normocephalic. Oropharynx and nasopharynx clear.  NECK:  Supple, no jugular venous distention. No thyroid enlargement, no tenderness.  LUNGS: Normal breath sounds bilaterally, no wheezing, rales, rhonchi. No use of accessory muscles of respiration.  Tenderness located in the posterior left chest wall CARDIOVASCULAR: S1, S2 normal. No murmurs, rubs, or gallops.  ABDOMEN: Soft, nontender, nondistended. Bowel sounds present. No organomegaly or mass.  EXTREMITIES: No cyanosis, clubbing or edema b/l.    NEUROLOGIC: Cranial nerves II through XII are intact. No focal Motor or sensory deficits b/l.   PSYCHIATRIC: The patient is alert and oriented x 3.  SKIN: No obvious rash, lesion, or ulcer.   LABORATORY PANEL:   CBC Recent Labs  Lab 05/20/18 0449  WBC 5.6  HGB 10.6*  HCT 35.9*  PLT 233   ------------------------------------------------------------------------------------------------------------------ Chemistries  Recent Labs  Lab 05/19/18 1054 05/20/18 0449  NA 135 134*  K 4.2 4.0  CL 99 97*  CO2 26 30  GLUCOSE 100* 86  BUN 14 13  CREATININE 0.76 0.77  CALCIUM 9.2 8.9  AST 18  --   ALT 15  --   ALKPHOS 67  --  BILITOT 0.6  --    ------------------------------------------------------------------------------------------------------------------  Cardiac Enzymes No results for input(s): TROPONINI in the last 168  hours. ------------------------------------------------------------------------------------------------------------------  RADIOLOGY:  Ct Chest W Contrast  Addendum Date: 05/19/2018   ADDENDUM REPORT: 05/19/2018 14:44 ADDENDUM: Left eighth and ninth rib fractures are present. The left eighth rib fracture is minimally displaced. The left ninth rib fracture is displaced by 1 shaft width. There is focal soft tissue swelling along the chest wall. The left pleural effusion does not demonstrate hyperdense blood products. The effusion is likely reactive to the fractures without definite infection. Airspace disease most likely reflects atelectasis. Electronically Signed   By: San Morelle M.D.   On: 05/19/2018 14:44   Result Date: 05/19/2018 CLINICAL DATA:  Left-sided chest pain. Question rib fracture. Left-sided chest pain for 3 days. Recent admission to [facility]. Personal history of colon resection, cholecystectomy, hysterectomy, and hernia repair. EXAM: CT CHEST, ABDOMEN, AND PELVIS WITH CONTRAST TECHNIQUE: Multidetector CT imaging of the chest, abdomen and pelvis was performed following the standard protocol during bolus administration of intravenous contrast. CONTRAST:  129mL ISOVUE-300 IOPAMIDOL (ISOVUE-300) INJECTION 61% COMPARISON:  One-view chest x-ray 04/25/2018. CT of the chest 04/20/2018. FINDINGS: CT CHEST FINDINGS Cardiovascular: Heart size is normal. Coronary artery calcifications are present. Mitral annular calcifications are present. Calcifications present at the aortic arch without aneurysm. Pulmonary arteries are within normal limits. No significant pericardial effusion is present. Mediastinum/Nodes: No significant mediastinal, hilar, or axillary adenopathy is present. Lungs/Pleura: Progressive left lower lobe airspace disease is present. A an increasing left pleural effusion is present. Previously seen right upper lobe airspace disease is improving. Chronic  interstitial coarsening is otherwise stable. No significant right pleural effusion is present. Musculoskeletal: Vertebral body heights maintained. AP alignment is anatomic. Rightward curvature of the upper thoracic spine is again noted. There is fusion across the disc space at T5-6. CT ABDOMEN PELVIS FINDINGS Hepatobiliary: Mild intra and extrahepatic biliary dilation are present following cholecystectomy. No obstructing mass lesion is present. No other focal patent lesions are present. Pancreas: Mild dilation of the pancreatic duct is present proximally. Tail is within normal limits. No mass lesion is evident. Spleen: Normal in size without focal abnormality. Adrenals/Urinary Tract: Adrenal glands within normal limits bilaterally. Kidneys and ureters are within normal limits. Bladder is unremarkable. Stomach/Bowel: Partial gastrectomy is noted. No stenosis is intact. Residual stomach is unremarkable. Small bowel is within normal limits. Adhesions are present proximally without obstruction. Terminal ileum is normal. Appendix is visualized and within normal limits. Ascending transverse colon are within normal limits. Descending colon is normal. Diverticular changes are present in the sigmoid colon. Anastomosis is intact. No significant inflammation or obstruction is present. Vascular/Lymphatic: Atherosclerotic calcifications are present the abdominal aorta without aneurysm. Calcifications are present at the origins of the right renal artery. Thrombus is present in the portal vein. Portal vein is dilated. Thrombus is nonobstructive. Reproductive: Status post hysterectomy. No adnexal masses. Other: Postsurgical changes are present in the lower abdomen. No significant ventral hernias are present. There is no free fluid or free air. Musculoskeletal: Degenerative changes are most evident at L5-S1. Vertebral body heights alignment are normal. No focal lytic or blastic lesions are present. Pelvis is intact. Hips are located  and normal. IMPRESSION: 1. 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 2. Left pleural effusion and associated airspace disease. While much this is atelectasis, infection is also considered. 3. Resolving right upper lobe infection. 4.  Aortic Atherosclerosis (ICD10-I70.0). 5. Coronary artery disease 6. Stable  chronic dilation of the intra and extrahepatic biliary duct and pancreatic duct without obstructing lesion. 7. Previous bowel surgery without complication. 8. Sigmoid diverticulosis without diverticulitis. Electronically Signed: By: San Morelle M.D. On: 05/19/2018 14:24   Ct Abdomen Pelvis W Contrast  Addendum Date: 05/19/2018   ADDENDUM REPORT: 05/19/2018 14:44 ADDENDUM: Left eighth and ninth rib fractures are present. The left eighth rib fracture is minimally displaced. The left ninth rib fracture is displaced by 1 shaft width. There is focal soft tissue swelling along the chest wall. The left pleural effusion does not demonstrate hyperdense blood products. The effusion is likely reactive to the fractures without definite infection. Airspace disease most likely reflects atelectasis. Electronically Signed   By: San Morelle M.D.   On: 05/19/2018 14:44   Result Date: 05/19/2018 CLINICAL DATA:  Left-sided chest pain. Question rib fracture. Left-sided chest pain for 3 days. Recent admission to [facility]. Personal history of colon resection, cholecystectomy, hysterectomy, and hernia repair. EXAM: CT CHEST, ABDOMEN, AND PELVIS WITH CONTRAST TECHNIQUE: Multidetector CT imaging of the chest, abdomen and pelvis was performed following the standard protocol during bolus administration of intravenous contrast. CONTRAST:  125mL ISOVUE-300 IOPAMIDOL (ISOVUE-300) INJECTION 61% COMPARISON:  One-view chest x-ray 04/25/2018. CT of the chest 04/20/2018. FINDINGS: CT CHEST FINDINGS Cardiovascular: Heart size is normal. Coronary artery calcifications are present.  Mitral annular calcifications are present. Calcifications present at the aortic arch without aneurysm. Pulmonary arteries are within normal limits. No significant pericardial effusion is present. Mediastinum/Nodes: No significant mediastinal, hilar, or axillary adenopathy is present. Lungs/Pleura: Progressive left lower lobe airspace disease is present. A an increasing left pleural effusion is present. Previously seen right upper lobe airspace disease is improving. Chronic interstitial coarsening is otherwise stable. No significant right pleural effusion is present. Musculoskeletal: Vertebral body heights maintained. AP alignment is anatomic. Rightward curvature of the upper thoracic spine is again noted. There is fusion across the disc space at T5-6. CT ABDOMEN PELVIS FINDINGS Hepatobiliary: Mild intra and extrahepatic biliary dilation are present following cholecystectomy. No obstructing mass lesion is present. No other focal patent lesions are present. Pancreas: Mild dilation of the pancreatic duct is present proximally. Tail is within normal limits. No mass lesion is evident. Spleen: Normal in size without focal abnormality. Adrenals/Urinary Tract: Adrenal glands within normal limits bilaterally. Kidneys and ureters are within normal limits. Bladder is unremarkable. Stomach/Bowel: Partial gastrectomy is noted. No stenosis is intact. Residual stomach is unremarkable. Small bowel is within normal limits. Adhesions are present proximally without obstruction. Terminal ileum is normal. Appendix is visualized and within normal limits. Ascending transverse colon are within normal limits. Descending colon is normal. Diverticular changes are present in the sigmoid colon. Anastomosis is intact. No significant inflammation or obstruction is present. Vascular/Lymphatic: Atherosclerotic calcifications are present the abdominal aorta without aneurysm. Calcifications are present at the origins of the right renal artery.  Thrombus is present in the portal vein. Portal vein is dilated. Thrombus is nonobstructive. Reproductive: Status post hysterectomy. No adnexal masses. Other: Postsurgical changes are present in the lower abdomen. No significant ventral hernias are present. There is no free fluid or free air. Musculoskeletal: Degenerative changes are most evident at L5-S1. Vertebral body heights alignment are normal. No focal lytic or blastic lesions are present. Pelvis is intact. Hips are located and normal. IMPRESSION: 1. 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 2. Left pleural effusion and associated airspace disease. While much this is atelectasis, infection is also considered. 3. Resolving right upper  lobe infection. 4.  Aortic Atherosclerosis (ICD10-I70.0). 5. Coronary artery disease 6. Stable chronic dilation of the intra and extrahepatic biliary duct and pancreatic duct without obstructing lesion. 7. Previous bowel surgery without complication. 8. Sigmoid diverticulosis without diverticulitis. Electronically Signed: By: San Morelle M.D. On: 05/19/2018 14:24     ASSESSMENT AND PLAN:  62 year old female patient with history of COPD, recent colonoscopy with tubular adenomas, recent pneumonia, acute left rib fractures secondary to severe cough currently under hospitalist service for dyspnea and rib pain  -Dyspnea secondary to left pleural effusion Status post interventional radiology evaluation and patient had thoracentesis done today  500 mL of fluid was drained Patient tolerated procedure well Oxygen via nasal cannula  -Left thoracic rib cage pain secondary to ninth and eighth rib fractures Pain management orally as well as intravenously  -Right lung pneumonia improving Secondary to atelectasis and rib fracture Continue Levaquin antibiotic  -Hypertension Continue oral Norvasc  -History of bullous pemphigoid Continue steroids Methotrexate taken weekly  -Tobacco abuse Tobacco  cessation counseled to the patient for 6 minutes Nicotine patch offered   All the records are reviewed and case discussed with Care Management/Social Worker. Management plans discussed with the patient, family and they are in agreement.  CODE STATUS: Full code  DVT Prophylaxis: SCDs  TOTAL TIME TAKING CARE OF THIS PATIENT: 35 minutes.   POSSIBLE D/C IN 2 to 3 DAYS, DEPENDING ON CLINICAL CONDITION.  Saundra Shelling M.D on 05/20/2018 at 2:40 PM  Between 7am to 6pm - Pager - [phone]  After 6pm go to [email or web address] - password [email or web address] [facility]  Office  [phone]  CC: Primary care physician; System, Pcp Not In  Note: This dictation was prepared with Dragon dictation along with smaller phrase technology. Any transcriptional errors that result from this process are unintentional.

## 2018-05-20 NOTE — Procedures (Signed)
Interventional Radiology Procedure Note  Procedure: US guided left thoracentesis  Complications: None  Estimated Blood Loss: None  Findings: 500 mL of bloody fluid removed from left pleural space.  [name]. Kathlene Cote, M.D Pager:  [phone]

## 2018-05-21 DIAGNOSIS — J90 Pleural effusion, not elsewhere classified: Secondary | ICD-10-CM

## 2018-05-21 DIAGNOSIS — Z9889 Other specified postprocedural states: Secondary | ICD-10-CM

## 2018-05-21 DIAGNOSIS — R0602 Shortness of breath: Secondary | ICD-10-CM

## 2018-05-21 DIAGNOSIS — I81 Portal vein thrombosis: Secondary | ICD-10-CM

## 2018-05-21 LAB — HEMOGLOBIN A1C
Hgb A1c MFr Bld: 5 % (ref 4.8–5.6)
Mean Plasma Glucose: 96.8 mg/dL

## 2018-05-21 LAB — PH, BODY FLUID: PH, BODY FLUID: 7.6

## 2018-05-21 LAB — GLUCOSE, CAPILLARY
GLUCOSE-CAPILLARY: 129 mg/dL — AB (ref 70–99)
GLUCOSE-CAPILLARY: 96 mg/dL (ref 70–99)
Glucose-Capillary: 98 mg/dL (ref 70–99)
Glucose-Capillary: 105 mg/dL — ABNORMAL HIGH (ref 70–99)

## 2018-05-21 MED ORDER — BISACODYL 10 MG RE SUPP
10.0000 mg | Freq: Once | RECTAL | Status: AC
  Administered 2018-05-21: 20:00:00 10 mg via RECTAL
  Filled 2018-05-21: qty 1

## 2018-05-21 MED ORDER — ALPRAZOLAM 0.25 MG PO TABS
0.2500 mg | ORAL_TABLET | Freq: Three times a day (TID) | ORAL | Status: DC | PRN
  Administered 2018-05-21: 0.25 mg via ORAL
  Filled 2018-05-21: qty 1

## 2018-05-21 MED ORDER — FLEET ENEMA 7-19 GM/118ML RE ENEM: 1.0000 | ENEMA | Freq: Every day | RECTAL | Status: DC | PRN

## 2018-05-21 NOTE — Consult Note (Signed)
Hematology/Oncology Consult note [facility] [phone] [phone]  Patient Care Team: System, Pcp Not In as PCP - General   Name of the patient: Christine Nichols  [id number]  04/06/1956   Date of visit: 05/21/18 REASON FOR COSULTATION:   History of presenting illness-  62 y.o. female with PMH listed at below who is currently admitted due to left eighth and ninth rib fracture, pleural effusion, recent pneumonia, History of collagen vascular disease, Bullous pemphigoid. She had a recent admission from 04/20/2018 to 04/28/2018 due to multifocal community-acquired pneumonia, acute on chronic COPD exacerbation.  She also had left upper quadrant abdominal pain and had a CT abdomen pelvis with contrast done at that time which showed significant stool and a distention of ascending and transverse colon with transition zone identified in the region of splenic flexure.  Patient underwent colonoscopy on 04/27/2018 which did not reveal any colon mass.  Patient has benign polyps resected. During this admission, CT chest abdomen pelvis was obtained on 05/19/2018 which showed left eighth and ninth rib fracture, resolving right upper lobe infection.  Left pleural effusion and associated airspace disease.  There is also nonobstructing thrombus in the portal vein, which is noted to be enlarging. Patient was sitting the bed eating lunch.  She reports having intermittent left upper quadrant/left lower chest pain for about 3 months, now left upper quadrant pain is constant.  Pain is worsened by deep breath, pleuritic. She has history of hepatitis C following up with [facility] and just finished hepatitis C treatments.  She had upper endoscopy on 05/21/2016 which showed small hiatal hernia,Roux-en-Y gastrojejunostomy with gastrojejunal  anastomosis characterized by healthy appearing mucosa. - Normal examined jejunum.- Non-bleeding gastric ulcer with no stigmata of bleeding. -  Erythematous mucosa in the gastric body. FibroScan showed stage F1 mild fibrosis.    Review of Systems  Constitutional: Positive for fatigue. Negative for appetite change, chills and fever.  HENT:   Negative for hearing loss and voice change.   Eyes: Negative for eye problems.  Respiratory: Positive for cough. Negative for chest tightness.   Cardiovascular: Negative for chest pain.  Gastrointestinal: Positive for abdominal pain. Negative for abdominal distention and blood in stool.  Endocrine: Negative for hot flashes.  Genitourinary: Negative for difficulty urinating and frequency.   Musculoskeletal: Positive for back pain. Negative for arthralgias.  Skin: Negative for itching and rash.  Neurological: Negative for extremity weakness.  Hematological: Negative for adenopathy.  Psychiatric/Behavioral: Negative for confusion.    Allergies  Allergen Reactions  . Sulfa Antibiotics Anaphylaxis  . Sulfasalazine Anaphylaxis  . Codeine Nausea Only  . Lavender Oil     Sinus swelling, nausea  . Penicillins Rash    Has patient had a PCN reaction causing immediate rash, facial/tongue/throat swelling, SOB or lightheadedness with hypotension: Yes Has patient had a PCN reaction causing severe rash involving mucus membranes or skin necrosis: No Has patient had a PCN reaction that required hospitalization: No Has patient had a PCN reaction occurring within the last 10 years: No If all of the above answers are "NO", then may proceed with Cephalosporin use.    Patient Active Problem List   Diagnosis Date Noted  . Pleural effusion 05/19/2018  . Abnormal CT scan, sigmoid colon   . Polyp of sigmoid colon   . Benign neoplasm of ascending colon   . Benign neoplasm of cecum   . 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 Large bowel obstruction (  [place]) 04/20/2018  . Chronic hepatitis C without hepatic coma ([place])   . Varicella 10/16/2017     Past Medical  History:  Diagnosis Date  . Arthritis   . Asthma   . Bullous pemphigoid   . Collagen vascular disease (H[facility])    Lupus  . Diabetes mellitus without complication ([place])   . Hypertension   . Lupus ([place])   . Neuropathy      Past Surgical History:  Procedure Laterality Date  . ABDOMINAL HYSTERECTOMY    . ABDOMINAL SURGERY    . COLONOSCOPY WITH PROPOFOL N/A 04/27/2018   Procedure: COLONOSCOPY WITH PROPOFOL;  Surgeon: Lucilla Lame, MD;  Location: [facility] ENDOSCOPY;  Service: Endoscopy;  Laterality: N/A;    Social History   Socioeconomic History  . Marital status: Married    Spouse name: Not on file  . Number of children: Not on file  . Years of education: Not on file  . Highest education level: Not on file  Occupational History  . Not on file  Social Needs  . Financial resource strain: Not on file  . Food insecurity:    Worry: Not on file    Inability: Not on file  . Transportation needs:    Medical: Not on file    Non-medical: Not on file  Tobacco Use  . Smoking status: Heavy Tobacco Smoker  . Smokeless tobacco: Never Used  Substance and Sexual Activity  . Alcohol use: Never    Frequency: Never  . Drug use: Never  . Sexual activity: Not on file  Lifestyle  . Physical activity:    Days per week: Not on file    Minutes per session: Not on file  . Stress: Not on file  Relationships  . Social connections:    Talks on phone: Not on file    Gets together: Not on file    Attends religious service: Not on file    Active member of club or organization: Not on file    Attends meetings of clubs or organizations: Not on file    Relationship status: Not on file  . Intimate partner violence:    Fear of current or ex partner: Not on file    Emotionally abused: Not on file    Physically abused: Not on file    Forced sexual activity: Not on file  Other Topics Concern  . Not on file  Social History Narrative  . Not on file     History reviewed. No pertinent family  history.   Current Facility-Administered Medications:  .  acetaminophen (TYLENOL) tablet 500 mg, 500 mg, Oral, Q6H PRN, Pyreddy, Pavan, MD, 500 mg at 05/20/18 1202 .  albuterol (PROVENTIL) (2.5 MG/3ML) 0.083% nebulizer solution 3 mL, 3 mL, Inhalation, Q4H PRN, Mody, Sital, MD .  amLODipine (NORVASC) tablet 10 mg, 10 mg, Oral, Daily, Mody, Sital, MD, 10 mg at 05/21/18 0945 .  calcium carbonate (TUMS - dosed in mg elemental calcium) chewable tablet 200 mg of elemental calcium, 1 tablet, Oral, TID PRN, Pyreddy, Pavan, MD, 200 mg of elemental calcium at 05/19/18 2105 .  citalopram (CELEXA) tablet 40 mg, 40 mg, Oral, Daily, Mody, Sital, MD, 40 mg at 05/21/18 0945 .  clonazePAM (KLONOPIN) disintegrating tablet 0.25 mg, 0.25 mg, Oral, TID, Mody, Sital, MD, 0.25 mg at 05/21/18 0945 .  docusate sodium (COLACE) capsule 100 mg, 100 mg, Oral, BID, Pyreddy, Pavan, MD, 100 mg at 05/21/18 0945 .  enoxaparin (LOVENOX) injection 40 mg, 40 mg, Subcutaneous, Q24H, Mody,  Sital, MD, 40 mg at 05/20/18 2032 .  folic acid (FOLVITE) tablet 1 mg, 1 mg, Oral, Daily, Pyreddy, Pavan, MD, 1 mg at 05/21/18 0945 .  gabapentin (NEURONTIN) capsule 400 mg, 400 mg, Oral, TID, Pyreddy, Pavan, MD, 400 mg at 05/21/18 0945 .  guaiFENesin-dextromethorphan (ROBITUSSIN DM) 100-10 MG/5ML syrup 5 mL, 5 mL, Oral, Q4H PRN, Mody, Sital, MD .  HYDROmorphone (DILAUDID) injection 1 mg, 1 mg, Intravenous, Q4H PRN, Mody, Sital, MD, 1 mg at 05/21/18 0346 .  hydrOXYzine (ATARAX/VISTARIL) tablet 25 mg, 25 mg, Oral, Q8H PRN, Mody, Sital, MD .  insulin aspart (novoLOG) injection 0-5 Units, 0-5 Units, Subcutaneous, QHS, Mody, Sital, MD .  insulin aspart (novoLOG) injection 0-9 Units, 0-9 Units, Subcutaneous, TID WC, Mody, Sital, MD, 1 Units at 05/20/18 1736 .  levofloxacin (LEVAQUIN) tablet 750 mg, 750 mg, Oral, Daily, Pyreddy, Pavan, MD, 750 mg at 05/21/18 0945 .  levothyroxine (SYNTHROID, LEVOTHROID) tablet 75 mcg, 75 mcg, Oral, QAC breakfast, Mody,  Sital, MD, 75 mcg at 05/21/18 0546 .  loratadine (CLARITIN) tablet 10 mg, 10 mg, Oral, Daily, Mody, Sital, MD, 10 mg at 05/20/18 0914 .  nicotine (NICODERM CQ - dosed in mg/24 hours) patch 21 mg, 21 mg, Transdermal, Daily, Pyreddy, Pavan, MD, 21 mg at 05/21/18 0947 .  ondansetron (ZOFRAN) tablet 4 mg, 4 mg, Oral, Q6H PRN **OR** ondansetron (ZOFRAN) injection 4 mg, 4 mg, Intravenous, Q6H PRN, Benjie Karvonen, Sital, MD, 4 mg at 05/21/18 0346 .  oxyCODONE (Oxy IR/ROXICODONE) immediate release tablet 10 mg, 10 mg, Oral, Q6H PRN, Pyreddy, Pavan, MD, 10 mg at 05/21/18 0945 .  pantoprazole (PROTONIX) EC tablet 40 mg, 40 mg, Oral, Daily, Mody, Sital, MD, 40 mg at 05/21/18 0945 .  polyethylene glycol (MIRALAX / GLYCOLAX) packet 17 g, 17 g, Oral, Daily PRN, Bettey Costa, MD, 17 g at 05/20/18 2031 .  polyethylene glycol (MIRALAX / GLYCOLAX) packet 17 g, 17 g, Oral, Daily, Pyreddy, Pavan, MD, 17 g at 05/21/18 0946 .  predniSONE (DELTASONE) tablet 20 mg, 20 mg, Oral, Q breakfast, Mody, Sital, MD, 20 mg at 05/21/18 0946 .  triamcinolone ointment (KENALOG) 0.1 % 1 application, 1 application, Topical, BID PRN, Bettey Costa, MD   Physical exam: ECOG  Vitals:   05/20/18 1348 05/20/18 2042 05/21/18 0444 05/21/18 1356  BP: 140/86 139/90 116/73 133/89  Pulse: 75 68 63 76  Resp: 18 20 20 20   Temp: 97.8 F (36.6 C) 98.2 F (36.8 C)  98.9 F (37.2 C)  TempSrc: Oral Oral  Oral  SpO2: 94% 97% (!) 89% 93%  Weight:      Height:       Physical Exam      CMP Latest Ref Rng & Units 05/20/2018  Glucose 70 - 99 mg/dL 86  BUN 8 - 23 mg/dL 13  Creatinine 0.44 - 1.00 mg/dL 0.77  Sodium 135 - 145 mmol/L 134(L)  Potassium 3.5 - 5.1 mmol/L 4.0  Chloride 98 - 111 mmol/L 97(L)  CO2 22 - 32 mmol/L 30  Calcium 8.9 - 10.3 mg/dL 8.9  Total Protein 6.5 - 8.1 g/dL -  Total Bilirubin 0.3 - 1.2 mg/dL -  Alkaline Phos 38 - 126 U/L -  AST 15 - 41 U/L -  ALT 0 - 44 U/L -   CBC Latest Ref Rng & Units 05/20/2018  WBC 4.0 - 10.5 K/uL  5.6  Hemoglobin 12.0 - 15.0 g/dL 10.6(L)  Hematocrit 36.0 - 46.0 % 35.9(L)  Platelets 150 - 400 K/uL 233  RADIOGRAPHIC STUDIES: I have personally reviewed the radiological images as listed and agreed with the findings in the report.  Dg Chest 1 View  Result Date: 05/20/2018 CLINICAL DATA:  Status post left thoracentesis. EXAM: CHEST  1 VIEW COMPARISON:  CT of the chest on 05/19/2018 FINDINGS: After left thoracentesis, small amount of residual fluid is suspected without significant residual pleural effusion. No pneumothorax. Atelectasis present at the left lung base. No edema. Mild cardiac enlargement. IMPRESSION: No significant residual left pleural fluid after thoracentesis. No pneumothorax. Electronically Signed   By: Aletta Edouard M.D.   On: 05/20/2018 15:06   Dg Ribs Unilateral Left  Result Date: 04/22/2018 CLINICAL DATA:  Left flank and hip pain with cough and dyspnea. EXAM: LEFT RIBS - 2 VIEW COMPARISON:  None. FINDINGS: Acute displaced left lateral ninth rib fracture without associated pneumothorax. Left basilar atelectasis is noted. Cardiomegaly is noted with aortic atherosclerosis. Numerous chain sutures project over the left hemiabdomen and upper quadrant. Facet arthrosis of the included lower cervical spine. The included left shoulder is intact. IMPRESSION: 1. Acute displaced left lateral ninth rib fracture without associated pneumothorax or hemothorax. 2. Cardiomegaly with aortic atherosclerosis. Electronically Signed   By: Ashley Royalty M.D.   On: 04/22/2018 15:05   Dg Abd 1 View  Result Date: 04/25/2018 CLINICAL DATA:  Abdominal distention for 1 week EXAM: ABDOMEN - 1 VIEW COMPARISON:  04/20/2018 FINDINGS: Scattered large and small bowel gas is noted. Postsurgical changes are again identified. No obstructive changes are seen. No findings of constipation are noted. No acute bony abnormality is seen. IMPRESSION: No acute abnormality noted. Electronically Signed   By: Inez Catalina  M.D.   On: 04/25/2018 20:24   Ct Chest W Contrast  Addendum Date: 05/19/2018   ADDENDUM REPORT: 05/19/2018 14:44 ADDENDUM: Left eighth and ninth rib fractures are present. The left eighth rib fracture is minimally displaced. The left ninth rib fracture is displaced by 1 shaft width. There is focal soft tissue swelling along the chest wall. The left pleural effusion does not demonstrate hyperdense blood products. The effusion is likely reactive to the fractures without definite infection. Airspace disease most likely reflects atelectasis. Electronically Signed   By: San Morelle M.D.   On: 05/19/2018 14:44   Result Date: 05/19/2018 CLINICAL DATA:  Left-sided chest pain. Question rib fracture. Left-sided chest pain for 3 days. Recent admission to [facility]. Personal history of colon resection, cholecystectomy, hysterectomy, and hernia repair. EXAM: CT CHEST, ABDOMEN, AND PELVIS WITH CONTRAST TECHNIQUE: Multidetector CT imaging of the chest, abdomen and pelvis was performed following the standard protocol during bolus administration of intravenous contrast. CONTRAST:  152mL ISOVUE-300 IOPAMIDOL (ISOVUE-300) INJECTION 61% COMPARISON:  One-view chest x-ray 04/25/2018. CT of the chest 04/20/2018. FINDINGS: CT CHEST FINDINGS Cardiovascular: Heart size is normal. Coronary artery calcifications are present. Mitral annular calcifications are present. Calcifications present at the aortic arch without aneurysm. Pulmonary arteries are within normal limits. No significant pericardial effusion is present. Mediastinum/Nodes: No significant mediastinal, hilar, or axillary adenopathy is present. Lungs/Pleura: Progressive left lower lobe airspace disease is present. A an increasing left pleural effusion is present. Previously seen right upper lobe airspace disease is improving. Chronic interstitial coarsening is otherwise stable. No significant right pleural effusion is present. Musculoskeletal:  Vertebral body heights maintained. AP alignment is anatomic. Rightward curvature of the upper thoracic spine is again noted. There is fusion across the disc space at T5-6. CT ABDOMEN PELVIS FINDINGS Hepatobiliary: Mild intra and extrahepatic biliary dilation  are present following cholecystectomy. No obstructing mass lesion is present. No other focal patent lesions are present. Pancreas: Mild dilation of the pancreatic duct is present proximally. Tail is within normal limits. No mass lesion is evident. Spleen: Normal in size without focal abnormality. Adrenals/Urinary Tract: Adrenal glands within normal limits bilaterally. Kidneys and ureters are within normal limits. Bladder is unremarkable. Stomach/Bowel: Partial gastrectomy is noted. No stenosis is intact. Residual stomach is unremarkable. Small bowel is within normal limits. Adhesions are present proximally without obstruction. Terminal ileum is normal. Appendix is visualized and within normal limits. Ascending transverse colon are within normal limits. Descending colon is normal. Diverticular changes are present in the sigmoid colon. Anastomosis is intact. No significant inflammation or obstruction is present. Vascular/Lymphatic: Atherosclerotic calcifications are present the abdominal aorta without aneurysm. Calcifications are present at the origins of the right renal artery. Thrombus is present in the portal vein. Portal vein is dilated. Thrombus is nonobstructive. Reproductive: Status post hysterectomy. No adnexal masses. Other: Postsurgical changes are present in the lower abdomen. No significant ventral hernias are present. There is no free fluid or free air. Musculoskeletal: Degenerative changes are most evident at L5-S1. Vertebral body heights alignment are normal. No focal lytic or blastic lesions are present. Pelvis is intact. Hips are located and normal. IMPRESSION: 1. 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 2. Left pleural effusion  and associated airspace disease. While much this is atelectasis, infection is also considered. 3. Resolving right upper lobe infection. 4.  Aortic Atherosclerosis (ICD10-I70.0). 5. Coronary artery disease 6. Stable chronic dilation of the intra and extrahepatic biliary duct and pancreatic duct without obstructing lesion. 7. Previous bowel surgery without complication. 8. Sigmoid diverticulosis without diverticulitis. Electronically Signed: By: San Morelle M.D. On: 05/19/2018 14:24   Ct Abdomen Pelvis W Contrast  Addendum Date: 05/19/2018   ADDENDUM REPORT: 05/19/2018 14:44 ADDENDUM: Left eighth and ninth rib fractures are present. The left eighth rib fracture is minimally displaced. The left ninth rib fracture is displaced by 1 shaft width. There is focal soft tissue swelling along the chest wall. The left pleural effusion does not demonstrate hyperdense blood products. The effusion is likely reactive to the fractures without definite infection. Airspace disease most likely reflects atelectasis. Electronically Signed   By: San Morelle M.D.   On: 05/19/2018 14:44   Result Date: 05/19/2018 CLINICAL DATA:  Left-sided chest pain. Question rib fracture. Left-sided chest pain for 3 days. Recent admission to [facility]. Personal history of colon resection, cholecystectomy, hysterectomy, and hernia repair. EXAM: CT CHEST, ABDOMEN, AND PELVIS WITH CONTRAST TECHNIQUE: Multidetector CT imaging of the chest, abdomen and pelvis was performed following the standard protocol during bolus administration of intravenous contrast. CONTRAST:  179mL ISOVUE-300 IOPAMIDOL (ISOVUE-300) INJECTION 61% COMPARISON:  One-view chest x-ray 04/25/2018. CT of the chest 04/20/2018. FINDINGS: CT CHEST FINDINGS Cardiovascular: Heart size is normal. Coronary artery calcifications are present. Mitral annular calcifications are present. Calcifications present at the aortic arch without aneurysm. Pulmonary  arteries are within normal limits. No significant pericardial effusion is present. Mediastinum/Nodes: No significant mediastinal, hilar, or axillary adenopathy is present. Lungs/Pleura: Progressive left lower lobe airspace disease is present. A an increasing left pleural effusion is present. Previously seen right upper lobe airspace disease is improving. Chronic interstitial coarsening is otherwise stable. No significant right pleural effusion is present. Musculoskeletal: Vertebral body heights maintained. AP alignment is anatomic. Rightward curvature of the upper thoracic spine is again noted. There is fusion across the disc space  at T5-6. CT ABDOMEN PELVIS FINDINGS Hepatobiliary: Mild intra and extrahepatic biliary dilation are present following cholecystectomy. No obstructing mass lesion is present. No other focal patent lesions are present. Pancreas: Mild dilation of the pancreatic duct is present proximally. Tail is within normal limits. No mass lesion is evident. Spleen: Normal in size without focal abnormality. Adrenals/Urinary Tract: Adrenal glands within normal limits bilaterally. Kidneys and ureters are within normal limits. Bladder is unremarkable. Stomach/Bowel: Partial gastrectomy is noted. No stenosis is intact. Residual stomach is unremarkable. Small bowel is within normal limits. Adhesions are present proximally without obstruction. Terminal ileum is normal. Appendix is visualized and within normal limits. Ascending transverse colon are within normal limits. Descending colon is normal. Diverticular changes are present in the sigmoid colon. Anastomosis is intact. No significant inflammation or obstruction is present. Vascular/Lymphatic: Atherosclerotic calcifications are present the abdominal aorta without aneurysm. Calcifications are present at the origins of the right renal artery. Thrombus is present in the portal vein. Portal vein is dilated. Thrombus is nonobstructive. Reproductive: Status post  hysterectomy. No adnexal masses. Other: Postsurgical changes are present in the lower abdomen. No significant ventral hernias are present. There is no free fluid or free air. Musculoskeletal: Degenerative changes are most evident at L5-S1. Vertebral body heights alignment are normal. No focal lytic or blastic lesions are present. Pelvis is intact. Hips are located and normal. IMPRESSION: 1. 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 2. Left pleural effusion and associated airspace disease. While much this is atelectasis, infection is also considered. 3. Resolving right upper lobe infection. 4.  Aortic Atherosclerosis (ICD10-I70.0). 5. Coronary artery disease 6. Stable chronic dilation of the intra and extrahepatic biliary duct and pancreatic duct without obstructing lesion. 7. Previous bowel surgery without complication. 8. Sigmoid diverticulosis without diverticulitis. Electronically Signed: By: San Morelle M.D. On: 05/19/2018 14:24   Dg Chest Port 1 View  Result Date: 04/25/2018 CLINICAL DATA:  Shortness of breath, left-sided chest pain EXAM: PORTABLE CHEST 1 VIEW COMPARISON:  04/20/2018 CT chest FINDINGS: Hazy right upper lobe airspace disease not well delineated compared with prior CT dated 04/20/2018 most consistent with pneumonia. Mild lingular airspace disease likely reflecting atelectasis. Small left pleural effusion and left basilar airspace disease likely reflecting compressive atelectasis. No pneumothorax. Stable cardiomediastinal silhouette. No acute osseous abnormality. IMPRESSION: Hazy right upper lobe airspace disease not well delineated compared with prior CT dated 04/20/2018 most consistent with pneumonia. Mild lingular airspace disease likely reflecting atelectasis. Small left pleural effusion and left basilar airspace disease likely reflecting compressive atelectasis. Electronically Signed   By: Kathreen Devoid   On: 04/25/2018 10:10   Dg Hip Unilat With Pelvis 2-3 Views  Left  Result Date: 04/22/2018 CLINICAL DATA:  Left flank and left hip pain made worse when coughing. EXAM: DG HIP (WITH OR WITHOUT PELVIS) 2-3V LEFT COMPARISON:  Coronal and sagittal reconstructed images through the pelvis and hips from an abdominal and pelvic CT scan dated April 20, 2018 FINDINGS: The bony pelvis is subjectively adequately mineralized. There is no lytic nor blastic lesion. No acute pelvic fracture is observed. AP and lateral views of the left hip reveal preservation of the joint space. The articular surfaces of the left femoral head and acetabulum remains smoothly rounded. The femoral neck, intertrochanteric, and subtrochanteric regions are normal. IMPRESSION: There is no acute or significant chronic bony abnormality of the left hip. Electronically Signed   By: David  Martinique M.D.   On: 04/22/2018 15:04   US Thoracentesis Asp Pleural Space  W/img Guide  Result Date: 05/20/2018 CLINICAL DATA:  Left-sided rib fractures and pleural effusion. EXAM: ULTRASOUND GUIDED LEFT THORACENTESIS COMPARISON:  None. PROCEDURE: An ultrasound guided thoracentesis was thoroughly discussed with the patient and questions answered. The benefits, risks, alternatives and complications were also discussed. The patient understands and wishes to proceed with the procedure. Written consent was obtained. Ultrasound was performed to localize and mark an adequate pocket of fluid in the left chest. The area was then prepped and draped in the normal sterile fashion. 1% Lidocaine was used for local anesthesia. Under ultrasound guidance a 6 French Safe-T-Centesis catheter was introduced. Thoracentesis was performed. The catheter was removed and a dressing applied. COMPLICATIONS: None FINDINGS: A total of approximately 500 mL of blood tinged fluid was removed. A fluid sample was sent for laboratory analysis. IMPRESSION: Successful ultrasound guided left thoracentesis yielding 500 mL of pleural fluid. Electronically Signed    By: Aletta Edouard M.D.   On: 05/20/2018 16:00    Assessment and plan- Patient is a 62 y.o. female currently admitted for rib fracture, pleural effusion status post thoracentesis,  Incidental findings on CT which showed nonobstructing portal vein thrombosis.  #Nonobstructing portal vein thrombosis, I called and discussed with radiologist Dr. Jobe Igo.  Patient's previous CT scan approximately 4 weeks ago also was positive for nonobstructive portal vein thrombosis [not mentioned in the report], most recent CT showed enlarging thrombosis, probably chronic.  No collaterals. Patient has normal liver function test.  She does have left upper quadrant pain/left lower chest pain, which are sporadic in nature, most likely caused by rib fracture. No reported history of sudden onset of progressive colicky abdominal pain. I recommend patient to continue have DVT prophylaxis with Lovenox.  Rule off anticoagulation in patient with chronic portal vein thrombosis is unclear.  Patient is also at risk of bleeding with history of gastric ulcer, chronic anemia, I recommend close monitoring.   She can follow-up with me outpatient for checking hypercoagulable work-up. She should also follow-up with her GI at [facility] for evaluation of risk of variceal bleeding.   Thank you for allowing me to participate in the care of this patient.  Total face to face encounter time for this patient visit was 7min. >50% of the time was  spent in counseling and coordination of care.    Earlie Server, MD, PhD Hematology Oncology [facility] at [facility] Pager- [phone] 05/21/2018

## 2018-05-21 NOTE — Progress Notes (Signed)
[facility] [place] NAME: Christine Nichols    MR#:  [id number]  DATE OF BIRTH:  March 01, 1956  SUBJECTIVE:  CHIEF COMPLAINT:   Chief Complaint  Patient presents with  . Abdominal Pain  Patient seen and evaluated today Pain in the left chest wall is better On and off abdominal pain No fever Successfully had thoracentesis yesterday  REVIEW OF SYSTEMS:    ROS  CONSTITUTIONAL: No documented fever. No fatigue, weakness. No weight gain, no weight loss.  EYES: No blurry or double vision.  ENT: No tinnitus. No postnasal drip. No redness of the oropharynx.  RESPIRATORY: Occasional cough, no wheeze, no hemoptysis. No dyspnea.  CARDIOVASCULAR: Left chest wall pain. No orthopnea. No palpitations. No syncope.  GASTROINTESTINAL: No nausea, no vomiting or diarrhea. No abdominal pain. No melena or hematochezia.  GENITOURINARY: No dysuria or hematuria.  ENDOCRINE: No polyuria or nocturia. No heat or cold intolerance.  HEMATOLOGY: No anemia. No bruising. No bleeding.  INTEGUMENTARY: No rashes. No lesions.  MUSCULOSKELETAL: No arthritis. No swelling. No gout.  NEUROLOGIC: No numbness, tingling, or ataxia. No seizure-type activity.  PSYCHIATRIC: No anxiety. No insomnia. No ADD.   DRUG ALLERGIES:   Allergies  Allergen Reactions  . Sulfa Antibiotics Anaphylaxis  . Sulfasalazine Anaphylaxis  . Codeine Nausea Only  . Lavender Oil     Sinus swelling, nausea  . Penicillins Rash    Has patient had a PCN reaction causing immediate rash, facial/tongue/throat swelling, SOB or lightheadedness with hypotension: Yes Has patient had a PCN reaction causing severe rash involving mucus membranes or skin necrosis: No Has patient had a PCN reaction that required hospitalization: No Has patient had a PCN reaction occurring within the last 10 years: No If all of the above answers are "NO", then may proceed with Cephalosporin use.    VITALS:  Blood pressure 116/73,  pulse 63, temperature 98.2 F (36.8 C), temperature source Oral, resp. rate 20, height 5\' 6"  (1.676 m), weight 95 kg, SpO2 (!) 89 %.  PHYSICAL EXAMINATION:   Physical Exam  GENERAL:  62 y.o.-year-old patient lying in the bed with no acute distress.  EYES: Pupils equal, round, reactive to light and accommodation. No scleral icterus. Extraocular muscles intact.  HEENT: Head atraumatic, normocephalic. Oropharynx and nasopharynx clear.  NECK:  Supple, no jugular venous distention. No thyroid enlargement, no tenderness.  LUNGS: Improved breath sounds bilaterally, decreased crepitations. No use of accessory muscles of respiration.  Tenderness located in the posterior left chest wall improved CARDIOVASCULAR: S1, S2 normal. No murmurs, rubs, or gallops.  ABDOMEN: Soft, nontender, nondistended. Bowel sounds present. No organomegaly or mass.  EXTREMITIES: No cyanosis, clubbing or edema b/l.    NEUROLOGIC: Cranial nerves II through XII are intact. No focal Motor or sensory deficits b/l.   PSYCHIATRIC: The patient is alert and oriented x 3.  SKIN: No obvious rash, lesion, or ulcer.   LABORATORY PANEL:   CBC Recent Labs  Lab 05/20/18 0449  WBC 5.6  HGB 10.6*  HCT 35.9*  PLT 233   ------------------------------------------------------------------------------------------------------------------ Chemistries  Recent Labs  Lab 05/19/18 1054 05/20/18 0449  NA 135 134*  K 4.2 4.0  CL 99 97*  CO2 26 30  GLUCOSE 100* 86  BUN 14 13  CREATININE 0.76 0.77  CALCIUM 9.2 8.9  AST 18  --   ALT 15  --   ALKPHOS 67  --   BILITOT 0.6  --    ------------------------------------------------------------------------------------------------------------------  Cardiac Enzymes No  results for input(s): TROPONINI in the last 168 hours. ------------------------------------------------------------------------------------------------------------------  RADIOLOGY:  Dg Chest 1 View  Result Date:  05/20/2018 CLINICAL DATA:  Status post left thoracentesis. EXAM: CHEST  1 VIEW COMPARISON:  CT of the chest on 05/19/2018 FINDINGS: After left thoracentesis, small amount of residual fluid is suspected without significant residual pleural effusion. No pneumothorax. Atelectasis present at the left lung base. No edema. Mild cardiac enlargement. IMPRESSION: No significant residual left pleural fluid after thoracentesis. No pneumothorax. Electronically Signed   By: Aletta Edouard M.D.   On: 05/20/2018 15:06   Ct Chest W Contrast  Addendum Date: 05/19/2018   ADDENDUM REPORT: 05/19/2018 14:44 ADDENDUM: Left eighth and ninth rib fractures are present. The left eighth rib fracture is minimally displaced. The left ninth rib fracture is displaced by 1 shaft width. There is focal soft tissue swelling along the chest wall. The left pleural effusion does not demonstrate hyperdense blood products. The effusion is likely reactive to the fractures without definite infection. Airspace disease most likely reflects atelectasis. Electronically Signed   By: San Morelle M.D.   On: 05/19/2018 14:44   Result Date: 05/19/2018 CLINICAL DATA:  Left-sided chest pain. Question rib fracture. Left-sided chest pain for 3 days. Recent admission to [facility]. Personal history of colon resection, cholecystectomy, hysterectomy, and hernia repair. EXAM: CT CHEST, ABDOMEN, AND PELVIS WITH CONTRAST TECHNIQUE: Multidetector CT imaging of the chest, abdomen and pelvis was performed following the standard protocol during bolus administration of intravenous contrast. CONTRAST:  135mL ISOVUE-300 IOPAMIDOL (ISOVUE-300) INJECTION 61% COMPARISON:  One-view chest x-ray 04/25/2018. CT of the chest 04/20/2018. FINDINGS: CT CHEST FINDINGS Cardiovascular: Heart size is normal. Coronary artery calcifications are present. Mitral annular calcifications are present. Calcifications present at the aortic arch without aneurysm.  Pulmonary arteries are within normal limits. No significant pericardial effusion is present. Mediastinum/Nodes: No significant mediastinal, hilar, or axillary adenopathy is present. Lungs/Pleura: Progressive left lower lobe airspace disease is present. A an increasing left pleural effusion is present. Previously seen right upper lobe airspace disease is improving. Chronic interstitial coarsening is otherwise stable. No significant right pleural effusion is present. Musculoskeletal: Vertebral body heights maintained. AP alignment is anatomic. Rightward curvature of the upper thoracic spine is again noted. There is fusion across the disc space at T5-6. CT ABDOMEN PELVIS FINDINGS Hepatobiliary: Mild intra and extrahepatic biliary dilation are present following cholecystectomy. No obstructing mass lesion is present. No other focal patent lesions are present. Pancreas: Mild dilation of the pancreatic duct is present proximally. Tail is within normal limits. No mass lesion is evident. Spleen: Normal in size without focal abnormality. Adrenals/Urinary Tract: Adrenal glands within normal limits bilaterally. Kidneys and ureters are within normal limits. Bladder is unremarkable. Stomach/Bowel: Partial gastrectomy is noted. No stenosis is intact. Residual stomach is unremarkable. Small bowel is within normal limits. Adhesions are present proximally without obstruction. Terminal ileum is normal. Appendix is visualized and within normal limits. Ascending transverse colon are within normal limits. Descending colon is normal. Diverticular changes are present in the sigmoid colon. Anastomosis is intact. No significant inflammation or obstruction is present. Vascular/Lymphatic: Atherosclerotic calcifications are present the abdominal aorta without aneurysm. Calcifications are present at the origins of the right renal artery. Thrombus is present in the portal vein. Portal vein is dilated. Thrombus is nonobstructive. Reproductive:  Status post hysterectomy. No adnexal masses. Other: Postsurgical changes are present in the lower abdomen. No significant ventral hernias are present. There is no free fluid or free air. Musculoskeletal:  Degenerative changes are most evident at L5-S1. Vertebral body heights alignment are normal. No focal lytic or blastic lesions are present. Pelvis is intact. Hips are located and normal. IMPRESSION: 1. 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 2. Left pleural effusion and associated airspace disease. While much this is atelectasis, infection is also considered. 3. Resolving right upper lobe infection. 4.  Aortic Atherosclerosis (ICD10-I70.0). 5. Coronary artery disease 6. Stable chronic dilation of the intra and extrahepatic biliary duct and pancreatic duct without obstructing lesion. 7. Previous bowel surgery without complication. 8. Sigmoid diverticulosis without diverticulitis. Electronically Signed: By: San Morelle M.D. On: 05/19/2018 14:24   Ct Abdomen Pelvis W Contrast  Addendum Date: 05/19/2018   ADDENDUM REPORT: 05/19/2018 14:44 ADDENDUM: Left eighth and ninth rib fractures are present. The left eighth rib fracture is minimally displaced. The left ninth rib fracture is displaced by 1 shaft width. There is focal soft tissue swelling along the chest wall. The left pleural effusion does not demonstrate hyperdense blood products. The effusion is likely reactive to the fractures without definite infection. Airspace disease most likely reflects atelectasis. Electronically Signed   By: San Morelle M.D.   On: 05/19/2018 14:44   Result Date: 05/19/2018 CLINICAL DATA:  Left-sided chest pain. Question rib fracture. Left-sided chest pain for 3 days. Recent admission to [facility]. Personal history of colon resection, cholecystectomy, hysterectomy, and hernia repair. EXAM: CT CHEST, ABDOMEN, AND PELVIS WITH CONTRAST TECHNIQUE: Multidetector CT imaging of the  chest, abdomen and pelvis was performed following the standard protocol during bolus administration of intravenous contrast. CONTRAST:  169mL ISOVUE-300 IOPAMIDOL (ISOVUE-300) INJECTION 61% COMPARISON:  One-view chest x-ray 04/25/2018. CT of the chest 04/20/2018. FINDINGS: CT CHEST FINDINGS Cardiovascular: Heart size is normal. Coronary artery calcifications are present. Mitral annular calcifications are present. Calcifications present at the aortic arch without aneurysm. Pulmonary arteries are within normal limits. No significant pericardial effusion is present. Mediastinum/Nodes: No significant mediastinal, hilar, or axillary adenopathy is present. Lungs/Pleura: Progressive left lower lobe airspace disease is present. A an increasing left pleural effusion is present. Previously seen right upper lobe airspace disease is improving. Chronic interstitial coarsening is otherwise stable. No significant right pleural effusion is present. Musculoskeletal: Vertebral body heights maintained. AP alignment is anatomic. Rightward curvature of the upper thoracic spine is again noted. There is fusion across the disc space at T5-6. CT ABDOMEN PELVIS FINDINGS Hepatobiliary: Mild intra and extrahepatic biliary dilation are present following cholecystectomy. No obstructing mass lesion is present. No other focal patent lesions are present. Pancreas: Mild dilation of the pancreatic duct is present proximally. Tail is within normal limits. No mass lesion is evident. Spleen: Normal in size without focal abnormality. Adrenals/Urinary Tract: Adrenal glands within normal limits bilaterally. Kidneys and ureters are within normal limits. Bladder is unremarkable. Stomach/Bowel: Partial gastrectomy is noted. No stenosis is intact. Residual stomach is unremarkable. Small bowel is within normal limits. Adhesions are present proximally without obstruction. Terminal ileum is normal. Appendix is visualized and within normal limits. Ascending  transverse colon are within normal limits. Descending colon is normal. Diverticular changes are present in the sigmoid colon. Anastomosis is intact. No significant inflammation or obstruction is present. Vascular/Lymphatic: Atherosclerotic calcifications are present the abdominal aorta without aneurysm. Calcifications are present at the origins of the right renal artery. Thrombus is present in the portal vein. Portal vein is dilated. Thrombus is nonobstructive. Reproductive: Status post hysterectomy. No adnexal masses. Other: Postsurgical changes are present in the lower abdomen. No significant  ventral hernias are present. There is no free fluid or free air. Musculoskeletal: Degenerative changes are most evident at L5-S1. Vertebral body heights alignment are normal. No focal lytic or blastic lesions are present. Pelvis is intact. Hips are located and normal. IMPRESSION: 1. 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 2. Left pleural effusion and associated airspace disease. While much this is atelectasis, infection is also considered. 3. Resolving right upper lobe infection. 4.  Aortic Atherosclerosis (ICD10-I70.0). 5. Coronary artery disease 6. Stable chronic dilation of the intra and extrahepatic biliary duct and pancreatic duct without obstructing lesion. 7. Previous bowel surgery without complication. 8. Sigmoid diverticulosis without diverticulitis. Electronically Signed: By: San Morelle M.D. On: 05/19/2018 14:24   US Thoracentesis Asp Pleural Space W/img Guide  Result Date: 05/20/2018 CLINICAL DATA:  Left-sided rib fractures and pleural effusion. EXAM: ULTRASOUND GUIDED LEFT THORACENTESIS COMPARISON:  None. PROCEDURE: An ultrasound guided thoracentesis was thoroughly discussed with the patient and questions answered. The benefits, risks, alternatives and complications were also discussed. The patient understands and wishes to proceed with the procedure. Written consent was obtained.  Ultrasound was performed to localize and mark an adequate pocket of fluid in the left chest. The area was then prepped and draped in the normal sterile fashion. 1% Lidocaine was used for local anesthesia. Under ultrasound guidance a 6 French Safe-T-Centesis catheter was introduced. Thoracentesis was performed. The catheter was removed and a dressing applied. COMPLICATIONS: None FINDINGS: A total of approximately 500 mL of blood tinged fluid was removed. A fluid sample was sent for laboratory analysis. IMPRESSION: Successful ultrasound guided left thoracentesis yielding 500 mL of pleural fluid. Electronically Signed   By: Aletta Edouard M.D.   On: 05/20/2018 16:00     ASSESSMENT AND PLAN:  62 year old female patient with history of COPD, recent colonoscopy with tubular adenomas, recent pneumonia, acute left rib fractures secondary to severe cough currently under hospitalist service for dyspnea and rib pain  -Dyspnea secondary to left pleural effusion improving Status post interventional radiology evaluation and patient had thoracentesis done today  500 mL of fluid was drained Patient tolerated procedure well Oxygen via nasal cannula as needed  -Left thoracic rib cage pain secondary to ninth and eighth rib fractures Pain management orally as well as intravenously Supportive care  -Right lung pneumonia improving Secondary to atelectasis and rib fracture Continue Levaquin antibiotic  -Hypertension Continue oral Norvasc  -History of bullous pemphigoid Continue steroids Methotrexate taken weekly  -Tobacco abuse Tobacco cessation counseled to the patient for 6 minutes Nicotine patch offered  -Portal vein thrombus found on CT abdomen nonobstructing Hematology consult for further guidance on anticoagulation   All the records are reviewed and case discussed with Care Management/Social Worker. Management plans discussed with the patient, family and they are in agreement.  CODE STATUS:  Full code  DVT Prophylaxis: SCDs  TOTAL TIME TAKING CARE OF THIS PATIENT: 33 minutes.   POSSIBLE D/C IN 2 to 3 DAYS, DEPENDING ON CLINICAL CONDITION.  Saundra Shelling M.D on 05/21/2018 at 12:30 PM  Between 7am to 6pm - Pager - [phone]  After 6pm go to [email or web address] - password [email or web address] [facility]  Office  [phone]  CC: Primary care physician; System, Pcp Not In  Note: This dictation was prepared with Dragon dictation along with smaller phrase technology. Any transcriptional errors that result from this process are unintentional.

## 2018-05-21 NOTE — Plan of Care (Signed)
  Problem: Health Behavior/Discharge Planning: Goal: Ability to manage health-related needs will improve Outcome: Progressing   Problem: Activity: Goal: Risk for activity intolerance will decrease Outcome: Progressing   Problem: Pain Managment: Goal: General experience of comfort will improve Outcome: Progressing   Problem: Safety: Goal: Ability to remain free from injury will improve Outcome: Progressing   Problem: Skin Integrity: Goal: Risk for impaired skin integrity will decrease Outcome: Progressing

## 2018-05-21 NOTE — Progress Notes (Signed)
PT Cancellation Note  Patient Details Name: Christine Nichols MRN: [id number] DOB: 08/10/1955   Cancelled Treatment:    Reason Eval/Treat Not Completed: Medical issues which prohibited therapy(pt with portal vein thrombus).  Pt pending Hematology consult.  Will hold PT until plan of care updated. Will continue to follow pt acutely.    Collie Siad PT, DPT 05/21/2018, 2:19 PM

## 2018-05-22 LAB — GLUCOSE, CAPILLARY: Glucose-Capillary: 87 mg/dL (ref 70–99)

## 2018-05-22 MED ORDER — PREDNISONE 20 MG PO TABS: 20.0000 mg | ORAL_TABLET | Freq: Every day | ORAL | 0 refills | Status: AC

## 2018-05-22 MED ORDER — OXYCODONE HCL 10 MG PO TABS: 10.0000 mg | ORAL_TABLET | Freq: Four times a day (QID) | ORAL | 0 refills | Status: AC | PRN

## 2018-05-22 MED ORDER — CLONAZEPAM 0.25 MG PO TBDP: 0.2500 mg | ORAL_TABLET | Freq: Three times a day (TID) | ORAL | 0 refills | Status: AC

## 2018-05-22 MED ORDER — LEVOFLOXACIN 500 MG PO TABS: 500.0000 mg | ORAL_TABLET | Freq: Every day | ORAL | 0 refills | Status: AC

## 2018-05-22 NOTE — Discharge Summary (Signed)
Christine Nichols at [employer] NAME: Christine Nichols    MR#:  [id number]  DATE OF BIRTH:  Apr 12, 1956  DATE OF ADMISSION:  05/19/2018 ADMITTING PHYSICIAN: Bettey Costa, MD  DATE OF DISCHARGE: 05/22/2018  PRIMARY CARE PHYSICIAN: System, Pcp Not In   ADMISSION DIAGNOSIS:  Closed fracture of multiple ribs of left side, initial encounter [S22.42XA] Pleural effusion Pneumonia from atelectasis Type 2 diabetes mellitus Hypertension Bullous pemphigoid DISCHARGE DIAGNOSIS:  Pleural effusion Nonocclusive portal vein thrombosis Type 2 diabetes mellitus Hypertension Bullous pemphigoid Rib fractures  SECONDARY DIAGNOSIS:   Past Medical History:  Diagnosis Date  . Arthritis   . Asthma   . Bullous pemphigoid   . Collagen vascular disease (HCC)    Lupus  . Diabetes mellitus without complication ([facility])   . Hypertension   . Lupus ([place])   . Neuropathy      ADMITTING HISTORY Christine Nichols  is a 62 y.o. female with a known history of COPD with recent hospital stay for severe constipation s/p colonoscopy with pathology consistent with tubular adenomas without dysplasia, PNA and acute left rib fracture due to coughing who presented to the ED with left sided rib pain. She was apparently doing well after her most recent hospital stay howevere on Friday she was getting up and pushed up on the back of her chair and heard a popping sound and developed left sided rib pain. The pain has gotten worse and she is having some SOB.In the ED her CT scan shows Left eighth and ninth rib fractures are present. The left eighth ribfracture is minimally displaced. The left ninth rib fracture is displaced by 1 shaft width. There is focal soft tissue swellingalong the chest wall. The left pleural effusion does not demonstrate hyperdense blood products. The effusion is likely reactive to the fractures without definite infection. Airspace disease most likely reflects atelectasis. Her  pain is 10/10 and only better with  pain medications.   HOSPITAL COURSE:  Patient was admitted to medical floor.  Pain was controlled with narcotic medications.  Patient received Levaquin antibiotic for pneumonia.  She underwent thoracentesis by interventional radiology.  Final amount of pleural fluid was drained.  Shortness of breath improved and her left-sided rib cage pain also improved.  CT abdomen during the work-up done showed portal vein thrombosis which was nonocclusive.  Hematology consult was done who recommended no anticoagulation as it is chronic.  Patient will be followed up in the clinic.  CONSULTS OBTAINED:    DRUG ALLERGIES:   Allergies  Allergen Reactions  . Sulfa Antibiotics Anaphylaxis  . Sulfasalazine Anaphylaxis  . Codeine Nausea Only  . Lavender Oil     Sinus swelling, nausea  . Penicillins Rash    Has patient had a PCN reaction causing immediate rash, facial/tongue/throat swelling, SOB or lightheadedness with hypotension: Yes Has patient had a PCN reaction causing severe rash involving mucus membranes or skin necrosis: No Has patient had a PCN reaction that required hospitalization: No Has patient had a PCN reaction occurring within the last 10 years: No If all of the above answers are "NO", then may proceed with Cephalosporin use.    DISCHARGE MEDICATIONS:   Allergies as of 05/22/2018      Reactions   Sulfa Antibiotics Anaphylaxis   Sulfasalazine Anaphylaxis   Codeine Nausea Only   Lavender Oil    Sinus swelling, nausea   Penicillins Rash   Has patient had a PCN reaction causing immediate rash, facial/tongue/throat swelling, SOB  or lightheadedness with hypotension: Yes Has patient had a PCN reaction causing severe rash involving mucus membranes or skin necrosis: No Has patient had a PCN reaction that required hospitalization: No Has patient had a PCN reaction occurring within the last 10 years: No If all of the above answers are "NO", then may proceed  with Cephalosporin use.      Medication List    STOP taking these medications   benzonatate 200 MG capsule Commonly known as:  TESSALON   guaiFENesin-dextromethorphan 100-10 MG/5ML syrup Commonly known as:  ROBITUSSIN DM     TAKE these medications   acetaminophen 500 MG tablet Commonly known as:  TYLENOL Take 500 mg by mouth every 6 (six) hours as needed for mild pain or moderate pain.   albuterol 108 (90 Base) MCG/ACT inhaler Commonly known as:  PROVENTIL HFA;VENTOLIN HFA Inhale 2 puffs into the lungs every 4 (four) hours as needed for wheezing or shortness of breath.   amLODipine 5 MG tablet Commonly known as:  NORVASC Take 10 mg by mouth daily.   atovaquone 750 MG/5ML suspension Commonly known as:  MEPRON Take 750 mg by mouth daily.   cetirizine 10 MG tablet Commonly known as:  ZYRTEC Take 10 mg by mouth daily.   citalopram 40 MG tablet Commonly known as:  CELEXA Take 1 tablet by mouth daily.   clonazePAM 0.25 MG disintegrating tablet Commonly known as:  KLONOPIN Take 1 tablet (0.25 mg total) by mouth 3 (three) times daily.   diclofenac sodium 1 % Gel Commonly known as:  VOLTAREN APPLY 2 GRAMS TOPICALLY 4 TIMES DAILY   dimenhyDRINATE 50 MG tablet Commonly known as:  DRAMAMINE Take by mouth.   docusate sodium 100 MG capsule Commonly known as:  COLACE Take 1 tablet once or twice daily as needed for constipation while taking narcotic pain medicine   folic acid 1 MG tablet Commonly known as:  FOLVITE Take 1 mg by mouth daily.   hydrOXYzine 25 MG tablet Commonly known as:  ATARAX/VISTARIL Take 1 tablet (25 mg total) by mouth every 8 (eight) hours as needed for anxiety or itching.   levofloxacin 500 MG tablet Commonly known as:  LEVAQUIN Take 1 tablet (500 mg total) by mouth daily for 2 days.   levothyroxine 75 MCG tablet Commonly known as:  SYNTHROID, LEVOTHROID Take 75 mcg by mouth daily before breakfast.   metFORMIN 500 MG tablet Commonly known  as:  GLUCOPHAGE 500 mg 2 (two) times daily with a meal.   methotrexate 2.5 MG tablet Commonly known as:  RHEUMATREX Take 20 mg by mouth once a week. Caution:Chemotherapy. Protect from light. Takes on Saturdays   omeprazole 20 MG capsule Commonly known as:  PRILOSEC Take 20 mg by mouth daily.   Oxycodone HCl 10 MG Tabs Take 1 tablet (10 mg total) by mouth every 6 (six) hours as needed for moderate pain, severe pain or breakthrough pain.   predniSONE 20 MG tablet Commonly known as:  DELTASONE Take 1 tablet (20 mg total) by mouth daily with breakfast.   triamcinolone ointment 0.1 % Commonly known as:  KENALOG Apply 1 application topically 2 (two) times daily.   vitamin B-12 1000 MCG tablet Commonly known as:  CYANOCOBALAMIN Take by mouth.       Today  Patient seen and evaluated today Decreased left-sided rib cage pain Decreased back pain Weaned off oxygen Tolerating diet well Patient will be discharged home with home health services  VITAL SIGNS:  Blood pressure 131/78, pulse  63, temperature 98 F (36.7 C), temperature source Oral, resp. rate 17, height 5\' 6"  (1.676 m), weight 95 kg, SpO2 96 %.  I/O:    Intake/Output Summary (Last 24 hours) at 05/22/2018 1014 Last data filed at 05/21/2018 1345 Gross per 24 hour  Intake 240 ml  Output -  Net 240 ml    PHYSICAL EXAMINATION:  Physical Exam  GENERAL:  62 y.o.-year-old patient lying in the bed with no acute distress.  LUNGS: Normal breath sounds bilaterally, no wheezing, rales,rhonchi or crepitation. No use of accessory muscles of respiration.  CARDIOVASCULAR: S1, S2 normal. No murmurs, rubs, or gallops.  ABDOMEN: Soft, non-tender, non-distended. Bowel sounds present. No organomegaly or mass.  NEUROLOGIC: Moves all 4 extremities. PSYCHIATRIC: The patient is alert and oriented x 3.  SKIN: No obvious rash, lesion, or ulcer.   DATA REVIEW:   CBC Recent Labs  Lab 05/20/18 0449  WBC 5.6  HGB 10.6*  HCT 35.9*   PLT 233    Chemistries  Recent Labs  Lab 05/19/18 1054 05/20/18 0449  NA 135 134*  K 4.2 4.0  CL 99 97*  CO2 26 30  GLUCOSE 100* 86  BUN 14 13  CREATININE 0.76 0.77  CALCIUM 9.2 8.9  AST 18  --   ALT 15  --   ALKPHOS 67  --   BILITOT 0.6  --     Cardiac Enzymes No results for input(s): TROPONINI in the last 168 hours.  Microbiology Results  Results for orders placed or performed during the hospital encounter of 05/19/18  Gram stain     Status: None   Collection Time: 05/20/18 10:35 AM  Result Value Ref Range Status   Specimen Description   Final    PLEURAL Performed at [facility], [address], [place], [place] [address]    Special Requests   Final    NONE Performed at [facility], [address] [place], [place] [address]    Gram Stain   Final    FEW WBC PRESENT, PREDOMINANTLY PMN NO ORGANISMS SEEN Performed at [facility], [place] [address] [place], [place] [address]    Report Status 05/20/2018 FINAL  Final    RADIOLOGY:  Dg Chest 1 View  Result Date: 05/20/2018 CLINICAL DATA:  Status post left thoracentesis. EXAM: CHEST  1 VIEW COMPARISON:  CT of the chest on 05/19/2018 FINDINGS: After left thoracentesis, small amount of residual fluid is suspected without significant residual pleural effusion. No pneumothorax. Atelectasis present at the left lung base. No edema. Mild cardiac enlargement. IMPRESSION: No significant residual left pleural fluid after thoracentesis. No pneumothorax. Electronically Signed   By: Aletta Edouard M.D.   On: 05/20/2018 15:06   US Thoracentesis Asp Pleural Space W/img Guide  Result Date: 05/20/2018 CLINICAL DATA:  Left-sided rib fractures and pleural effusion. EXAM: ULTRASOUND GUIDED LEFT THORACENTESIS COMPARISON:  None. PROCEDURE: An ultrasound guided thoracentesis was thoroughly discussed with the patient and questions answered. The benefits, risks, alternatives and complications were also discussed.  The patient understands and wishes to proceed with the procedure. Written consent was obtained. Ultrasound was performed to localize and mark an adequate pocket of fluid in the left chest. The area was then prepped and draped in the normal sterile fashion. 1% Lidocaine was used for local anesthesia. Under ultrasound guidance a 6 French Safe-T-Centesis catheter was introduced. Thoracentesis was performed. The catheter was removed and a dressing applied. COMPLICATIONS: None FINDINGS: A total of approximately 500 mL of  blood tinged fluid was removed. A fluid sample was sent for laboratory analysis. IMPRESSION: Successful ultrasound guided left thoracentesis yielding 500 mL of pleural fluid. Electronically Signed   By: Aletta Edouard M.D.   On: 05/20/2018 16:00    Follow up with PCP in 1 week.  Management plans discussed with the patient, family and they are in agreement.  CODE STATUS: Full code    Code Status Orders  (From admission, onward)         Start     Ordered   05/19/18 1626  Full code  Continuous     05/19/18 1625        Code Status History    Date Active Date Inactive Code Status Order ID Comments User Context   04/21/2018 0102 04/28/2018 1427 Full Code [id number]  Amelia Jo, MD Inpatient   10/16/2017 1848 10/24/2017 1725 Full Code [id number]  Demetrios Loll, MD Inpatient      TOTAL TIME TAKING CARE OF THIS PATIENT ON DAY OF DISCHARGE: more than 34 minutes.   Saundra Shelling M.D on 05/22/2018 at 10:14 AM  Between 7am to 6pm - Pager - [phone]  After 6pm go to [email or web address] - password [email or web address] [facility]  Office  [phone]  CC: Primary care physician; System, Pcp Not In  Note: This dictation was prepared with Dragon dictation along with smaller phrase technology. Any transcriptional errors that result from this process are unintentional.

## 2018-05-22 NOTE — Consult Note (Signed)
Pharmacy Antibiotic Note  Christine Nichols is a 61 y.o. female admitted on 05/19/2018 with pneumonia.  CT shows left pleural effusion and possible infection. She was recently admitted and treated for right sided PNA. WBC wnl. Pharmacy has been consulted for Levaquin dosing.  Plan: Patient was switched to oral levofloxacin yesterday and stop date was added. Pharmacy will sign off. Please re-consult if further assistance is desired.  Height: 5\' 6"  (167.6 cm) Weight: 209 lb 7 oz (95 kg) IBW/kg (Calculated) : 59.3  Temp (24hrs), Avg:98.1 F (36.7 C), Min:97.6 F (36.4 C), Max:98.9 F (37.2 C)  Recent Labs  Lab 05/19/18 1054 05/20/18 0449  WBC 9.8 5.6  CREATININE 0.76 0.77    Estimated Creatinine Clearance: 85.8 mL/min (by C-G formula based on SCr of 0.77 mg/dL).    Allergies  Allergen Reactions  . Sulfa Antibiotics Anaphylaxis  . Sulfasalazine Anaphylaxis  . Codeine Nausea Only  . Lavender Oil     Sinus swelling, nausea  . Penicillins Rash    Has patient had a PCN reaction causing immediate rash, facial/tongue/throat swelling, SOB or lightheadedness with hypotension: Yes Has patient had a PCN reaction causing severe rash involving mucus membranes or skin necrosis: No Has patient had a PCN reaction that required hospitalization: No Has patient had a PCN reaction occurring within the last 10 years: No If all of the above answers are "NO", then may proceed with Cephalosporin use.    Antimicrobials this admission: levofloxacin 11/25 >>   Microbiology results: No recent results  Thank you for allowing pharmacy to be a part of this patient's care.  Laural Benes, PharmD 05/22/2018 9:20 AM

## 2018-05-22 NOTE — Progress Notes (Signed)
Received MD order to discharge patient to home, reviewed home meds, , prescriptions discharge instructions and follow up appointments with patient and patient verbalized understanding

## 2018-05-22 NOTE — Care Management Note (Signed)
Case Management Note  Patient Details  Name: Christine Nichols MRN: [id number] Date of Birth: 28-Feb-1956   Leroy Sea with [facility] notified of discharge.  Patient maintaining sats on RA  Subjective/Objective:                    Action/Plan:   Expected Discharge Date:  05/22/18               Expected Discharge Plan:  [place]  In-House Referral:     Discharge planning Services  CM Consult  Post Acute Care Choice:  Resumption of Svcs/PTA Provider Choice offered to:     DME Arranged:    DME Agency:     HH Arranged:  RN, PT [place] Agency:  [facility]  Status of Service:  Completed, signed off  If discussed at [place] of Stay Meetings, dates discussed:    Additional Comments:  Beverly Sessions, RN 05/22/2018, 8:43 AM

## 2018-05-26 MED ORDER — METHOTREXATE SODIUM 2.5 MG TABLET
ORAL_TABLET | ORAL | 2 refills | 0 days | Status: CP
Start: 2018-05-26 — End: 2018-06-23

## 2018-05-27 ENCOUNTER — Telehealth: Payer: Self-pay

## 2018-05-27 NOTE — Telephone Encounter (Signed)
EMMI Follow-up: Noted on the report that the patient had unfilled Rx's.  I talked with Christine Nichols and she did get all her Rx's filled but don't feel very good yet.  Has made her appointment with her PCP for Thursday, 12/5.  HH RN will be coming out shortly today and PT arrived as we were talking.  She thanked for me for checking on her.

## 2018-05-29 ENCOUNTER — Ambulatory Visit
Admit: 2018-05-29 | Discharge: 2018-05-30 | Payer: MEDICAID | Attending: Student in an Organized Health Care Education/Training Program | Primary: Student in an Organized Health Care Education/Training Program

## 2018-05-29 ENCOUNTER — Encounter: Admit: 2018-05-29 | Discharge: 2018-05-30 | Payer: MEDICAID

## 2018-05-29 DIAGNOSIS — S2242XD Multiple fractures of ribs, left side, subsequent encounter for fracture with routine healing: Secondary | ICD-10-CM

## 2018-05-29 DIAGNOSIS — Z09 Encounter for follow-up examination after completed treatment for conditions other than malignant neoplasm: Principal | ICD-10-CM

## 2018-05-29 DIAGNOSIS — I10 Essential (primary) hypertension: Secondary | ICD-10-CM

## 2018-05-29 DIAGNOSIS — J90 Pleural effusion, not elsewhere classified: Secondary | ICD-10-CM

## 2018-05-29 DIAGNOSIS — J189 Pneumonia, unspecified organism: Secondary | ICD-10-CM

## 2018-05-29 DIAGNOSIS — Z87898 Personal history of other specified conditions: Secondary | ICD-10-CM

## 2018-05-29 DIAGNOSIS — Z87891 Personal history of nicotine dependence: Secondary | ICD-10-CM

## 2018-05-29 MED ORDER — LIDOCAINE 5 % TOPICAL PATCH
MEDICATED_PATCH | Freq: Two times a day (BID) | TRANSDERMAL | 3 refills | 0 days | Status: CP
Start: 2018-05-29 — End: 2018-06-30

## 2018-05-29 MED ORDER — NICOTINE (POLACRILEX) 2 MG GUM
BUCCAL | 3 refills | 0.00000 days | Status: CP | PRN
Start: 2018-05-29 — End: 2019-01-01

## 2018-06-09 ENCOUNTER — Other Ambulatory Visit: Payer: Self-pay

## 2018-06-09 ENCOUNTER — Encounter: Payer: Self-pay | Admitting: Emergency Medicine

## 2018-06-09 ENCOUNTER — Telehealth: Payer: Self-pay | Admitting: Pharmacy Technician

## 2018-06-09 ENCOUNTER — Emergency Department: Payer: Medicaid Other

## 2018-06-09 ENCOUNTER — Emergency Department
Admission: EM | Admit: 2018-06-09 | Discharge: 2018-06-09 | Disposition: A | Discharge status: Home / Self Care | Payer: Medicaid Other | Attending: Emergency Medicine | Admitting: Emergency Medicine

## 2018-06-09 DIAGNOSIS — I10 Essential (primary) hypertension: Secondary | ICD-10-CM | POA: Diagnosis not present

## 2018-06-09 DIAGNOSIS — E119 Type 2 diabetes mellitus without complications: Secondary | ICD-10-CM | POA: Insufficient documentation

## 2018-06-09 DIAGNOSIS — F17210 Nicotine dependence, cigarettes, uncomplicated: Secondary | ICD-10-CM | POA: Diagnosis not present

## 2018-06-09 DIAGNOSIS — R1012 Left upper quadrant pain: Secondary | ICD-10-CM | POA: Insufficient documentation

## 2018-06-09 DIAGNOSIS — Z79899 Other long term (current) drug therapy: Secondary | ICD-10-CM | POA: Diagnosis not present

## 2018-06-09 DIAGNOSIS — Z7984 Long term (current) use of oral hypoglycemic drugs: Secondary | ICD-10-CM | POA: Diagnosis not present

## 2018-06-09 LAB — CBC
HCT: 42 % (ref 36.0–46.0)
Hemoglobin: 13.1 g/dL (ref 12.0–15.0)
MCH: 26.4 pg (ref 26.0–34.0)
MCHC: 31.2 g/dL (ref 30.0–36.0)
MCV: 84.5 fL (ref 80.0–100.0)
Platelets: 312 10*3/uL (ref 150–400)
RBC: 4.97 MIL/uL (ref 3.87–5.11)
RDW: 21.8 % — ABNORMAL HIGH (ref 11.5–15.5)
WBC: 9.3 10*3/uL (ref 4.0–10.5)
nRBC: 0 % (ref 0.0–0.2)

## 2018-06-09 LAB — COMPREHENSIVE METABOLIC PANEL
ALBUMIN: 4.2 g/dL (ref 3.5–5.0)
ALT: 14 U/L (ref 0–44)
AST: 18 U/L (ref 15–41)
Alkaline Phosphatase: 55 U/L (ref 38–126)
Anion gap: 11 (ref 5–15)
BUN: 10 mg/dL (ref 8–23)
CHLORIDE: 94 mmol/L — AB (ref 98–111)
CO2: 24 mmol/L (ref 22–32)
Calcium: 9.2 mg/dL (ref 8.9–10.3)
Creatinine, Ser: 0.61 mg/dL (ref 0.44–1.00)
GFR calc Af Amer: >60 mL/min (ref >60)
GFR calc non Af Amer: >60 mL/min (ref >60)
GLUCOSE: 113 mg/dL — AB (ref 70–99)
Potassium: 4 mmol/L (ref 3.5–5.1)
SODIUM: 129 mmol/L — AB (ref 135–145)
Total Bilirubin: 0.5 mg/dL (ref 0.3–1.2)
Total Protein: 7.1 g/dL (ref 6.5–8.1)

## 2018-06-09 LAB — LIPASE, BLOOD: Lipase: 149 U/L — ABNORMAL HIGH (ref 11–51)

## 2018-06-09 MED ORDER — DICYCLOMINE HCL 20 MG PO TABS: 20.0000 mg | ORAL_TABLET | Freq: Three times a day (TID) | ORAL | 0 refills | Status: AC | PRN

## 2018-06-09 MED ORDER — IOPAMIDOL (ISOVUE-300) INJECTION 61%
100.0000 mL | Freq: Once | INTRAVENOUS | Status: AC | PRN
  Administered 2018-06-09: 100 mL via INTRAVENOUS

## 2018-06-09 MED ORDER — HALOPERIDOL LACTATE 5 MG/ML IJ SOLN
5.0000 mg | Freq: Once | INTRAMUSCULAR | Status: AC
  Administered 2018-06-09: 5 mg via INTRAVENOUS
  Filled 2018-06-09: qty 1

## 2018-06-09 NOTE — ED Provider Notes (Signed)
[facility] Emergency Department Provider Note   ____________________________________________   I have reviewed the triage vital signs and the nursing notes.   HISTORY  Chief Complaint Abdominal Pain and Loss of Consciousness   History limited by: Not Limited   HPI Christine Nichols is a 62 y.o. female who presents to the emergency department today because of concerns for abdominal pain.  She states is located in her left upper quadrant.  She describes it as a sensation of a buildup of fluid.  The patient states reminds her when she had a pleural effusion drained.  She states it is worse with movement and sitting up.  She feels like she can feel the fluid move around it.  She denies any associated fevers.  She has not had is any change in urination or defecation.   Per medical record review patient has a history of pleural effusions, left rib fractures.   Past Medical History:  Diagnosis Date  . Arthritis   . Asthma   . Bullous pemphigoid   . Collagen vascular disease (HCC)    Lupus  . Diabetes mellitus without complication ([place])   . Hypertension   . Lupus ([place])   . Neuropathy     Patient Active Problem List   Diagnosis Date Noted  . Status post thoracentesis   . Shortness of breath   . Portal vein thrombosis   . Pleural effusion 05/19/2018  . Abnormal CT scan, sigmoid colon   . Polyp of sigmoid colon   . Benign neoplasm of ascending colon   . Benign neoplasm of cecum   . 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 Large bowel obstruction ([place]) 04/20/2018  . Chronic hepatitis C without hepatic coma ([place])   . Varicella 10/16/2017    Past Surgical History:  Procedure Laterality Date  . ABDOMINAL HYSTERECTOMY    . ABDOMINAL SURGERY    . COLONOSCOPY WITH PROPOFOL N/A 04/27/2018   Procedure: COLONOSCOPY WITH PROPOFOL;  Surgeon: Lucilla Lame, MD;  Location[facility] ENDOSCOPY;  Service: Endoscopy;  Laterality:  N/A;    Prior to Admission medications   Medication Sig Start Date End Date Taking? Authorizing Provider  acetaminophen (TYLENOL) 500 MG tablet Take 500 mg by mouth every 6 (six) hours as needed for mild pain or moderate pain.    [provider]  albuterol (PROVENTIL HFA;VENTOLIN HFA) 108 (90 Base) MCG/ACT inhaler Inhale 2 puffs into the lungs every 4 (four) hours as needed for wheezing or shortness of breath.     [provider]  amLODipine (NORVASC) 5 MG tablet Take 10 mg by mouth daily.    [provider]  atovaquone (MEPRON) 750 MG/5ML suspension Take 750 mg by mouth daily.    [provider]  cetirizine (ZYRTEC) 10 MG tablet Take 10 mg by mouth daily.    [provider]  citalopram (CELEXA) 40 MG tablet Take 1 tablet by mouth daily. 10/10/17   [provider]  clonazePAM (KLONOPIN) 0.25 MG disintegrating tablet Take 1 tablet (0.25 mg total) by mouth 3 (three) times daily. 05/22/18   Saundra Shelling, MD  diclofenac sodium (VOLTAREN) 1 % GEL APPLY 2 GRAMS TOPICALLY 4 TIMES DAILY 04/30/18   [provider]  dimenhyDRINATE (DRAMAMINE) 50 MG tablet Take by mouth.    [provider]  docusate sodium (COLACE) 100 MG capsule Take 1 tablet once or twice daily as needed for constipation while taking narcotic pain medicine 08/09/17  [name], MD  folic acid (FOLVITE) 1 MG tablet Take 1 mg by mouth daily.    [provider]  hydrOXYzine (ATARAX/VISTARIL) 25 MG tablet Take 1 tablet (25 mg total) by mouth every 8 (eight) hours as needed for anxiety or itching. 10/24/17   [name], [name], MD  levothyroxine (SYNTHROID, LEVOTHROID) 75 MCG tablet Take 75 mcg by mouth daily before breakfast.    [provider]  metFORMIN (GLUCOPHAGE) 500 MG tablet 500 mg 2 (two) times daily with a meal.  04/30/18   [provider]  methotrexate (RHEUMATREX) 2.5 MG tablet Take 20 mg by mouth once a week. Caution:Chemotherapy.  Protect from light. Takes on Saturdays    [provider]  omeprazole (PRILOSEC) 20 MG capsule Take 20 mg by mouth daily.    [provider]  oxyCODONE 10 MG TABS Take 1 tablet (10 mg total) by mouth every 6 (six) hours as needed for moderate pain, severe pain or breakthrough pain. 05/22/18   Saundra Shelling, MD  predniSONE (DELTASONE) 20 MG tablet Take 1 tablet (20 mg total) by mouth daily with breakfast. 05/22/18 06/21/18  Saundra Shelling, MD  triamcinolone ointment (KENALOG) 0.1 % Apply 1 application topically 2 (two) times daily.    [provider]  vitamin B-12 (CYANOCOBALAMIN) 1000 MCG tablet Take by mouth.    [provider]    Allergies Sulfa antibiotics; Sulfasalazine; Codeine; Lavender oil; and Penicillins  History reviewed. No pertinent family history.  Social History Social History   Tobacco Use  . Smoking status: Heavy Tobacco Smoker  . Smokeless tobacco: Never Used  Substance Use Topics  . Alcohol use: Never    Frequency: Never  . Drug use: Never    Review of Systems Constitutional: No fever/chills Eyes: No visual changes. ENT: No sore throat. Cardiovascular: Denies chest pain. Respiratory: Denies shortness of breath. Gastrointestinal: Positive for abdominal pain, nausea.  Genitourinary: Negative for dysuria. Musculoskeletal: Negative for back pain. Skin: Negative for rash. Neurological: Negative for headaches, focal weakness or numbness.  ____________________________________________   PHYSICAL EXAM:  VITAL SIGNS: ED Triage Vitals  Enc Vitals Group     BP 06/09/18 1659 134/90     Pulse Rate 06/09/18 1657 79     Resp 06/09/18 1657 (!) 22     Temp 06/09/18 1659 98.2 F (36.8 C)     Temp src --      SpO2 06/09/18 1657 96 %     Weight 06/09/18 1654 195 lb (88.5 kg)     Height 06/09/18 1654 5\' 6"  (1.676 m)     Head Circumference --      Peak Flow --      Pain Score 06/09/18 1654 7   Constitutional: Alert and  oriented.  Eyes: Conjunctivae are normal.  ENT      Head: Normocephalic and atraumatic.      Nose: No congestion/rhinnorhea.      Mouth/Throat: Mucous membranes are moist.      Neck: No stridor. Hematological/Lymphatic/Immunilogical: No cervical lymphadenopathy. Cardiovascular: Normal rate, regular rhythm.  No murmurs, rubs, or gallops.  Respiratory: Normal respiratory effort without tachypnea nor retractions. Breath sounds are clear and equal bilaterally. No wheezes/rales/rhonchi. Gastrointestinal: Soft and tender to palpation in the left abdomen. No rebound. No guarding. No appreciable swelling on exam.  Genitourinary: Deferred Musculoskeletal: Normal range of motion in all extremities. No lower extremity edema. Neurologic:  Normal speech and language. No gross focal neurologic deficits are appreciated.  Skin:  Skin is warm,  dry and intact. No rash noted. Psychiatric: Mood and affect are normal. Speech and behavior are normal. Patient exhibits appropriate insight and judgment.  ____________________________________________    LABS (pertinent positives/negatives)  Lipase 149 CBC wbc 9.3, hgb 13.1, plt 312 CMP na 129, glu 113, cr 0.61  ____________________________________________   EKG  I, Nance Pear, attending physician, personally viewed and interpreted this EKG  EKG Time: 1657 Rate: 79 Rhythm: sinus rhythm Axis: normal Intervals: qtc 489 QRS: narrow, low voltage ST changes: no st elevation Impression: abnormal ekg   ____________________________________________    RADIOLOGY  CT abd/pel Stable portal venous thrombus. No acute abnormality.   ____________________________________________   PROCEDURES  Procedures  ____________________________________________   INITIAL IMPRESSION / ASSESSMENT AND PLAN / ED COURSE  Pertinent labs & imaging results that were available during my care of the patient were reviewed by me and considered in my medical decision  making (see chart for details).   Patient presented to the emergency department today because of concerns for abdominal pain.  Differential would be broad including shingles, gastritis, lower lobe pneumonia, diverticulitis amongst other etiologies.  Patient's blood work without concerning findings save for slight elevation of lipase.  However I doubt pancreatitis given the location of patient's pain being more left-sided not central.  Additionally patient is not particularly tender over the epigastric region but more tender over the left.  P get CT scan of the abdomen pelvis which not show any acute abnormality.  Discussed this findings with the patient.  She did feel better after IV Haldol.  Discussed importance of following up with primary care.   ____________________________________________   FINAL CLINICAL IMPRESSION(S) / ED DIAGNOSES  Final diagnoses:  Left upper quadrant pain     Note: This dictation was prepared with Dragon dictation. Any transcriptional errors that result from this process are unintentional     Nance Pear, MD 06/09/18 2032

## 2018-06-09 NOTE — ED Notes (Signed)
Patient transported to CT 

## 2018-06-09 NOTE — Telephone Encounter (Signed)
Patient has Medicaid.  No longer meets [facility]'s eligibility criteria.  Patient notified by letter.  [facility]

## 2018-06-09 NOTE — ED Triage Notes (Signed)
PT to ER via EMS from home with c/o abdominal pain that feels like "water sloshing around"  Pt also reports possible syncopal episode after standing at home.

## 2018-06-09 NOTE — Discharge Instructions (Addendum)
Please seek medical attention for any high fevers, chest pain, shortness of breath, change in behavior, persistent vomiting, bloody stool or any other new or concerning symptoms.  

## 2018-06-12 ENCOUNTER — Encounter: Payer: Medicaid Other | Admitting: Pharmacist

## 2018-06-23 ENCOUNTER — Encounter: Admit: 2018-06-23 | Discharge: 2018-06-24 | Payer: MEDICAID | Attending: Dermatology | Primary: Dermatology

## 2018-06-23 DIAGNOSIS — L120 Bullous pemphigoid: Principal | ICD-10-CM

## 2018-06-23 MED ORDER — METHOTREXATE SODIUM 2.5 MG TABLET
ORAL_TABLET | ORAL | 2 refills | 0.00000 days | Status: CP
Start: 2018-06-23 — End: 2018-08-04

## 2018-06-23 MED ORDER — PREDNISONE 5 MG TABLET
ORAL_TABLET | 0 refills | 0 days | Status: CP
Start: 2018-06-23 — End: 2018-08-25

## 2018-06-24 MED ORDER — HYDROXYZINE HCL 25 MG TABLET
ORAL_TABLET | Freq: Four times a day (QID) | ORAL | 2 refills | 0.00000 days | Status: CP | PRN
Start: 2018-06-24 — End: 2018-07-24

## 2018-06-30 ENCOUNTER — Ambulatory Visit
Admit: 2018-06-30 | Discharge: 2018-07-01 | Payer: MEDICAID | Attending: Student in an Organized Health Care Education/Training Program | Primary: Student in an Organized Health Care Education/Training Program

## 2018-06-30 DIAGNOSIS — F419 Anxiety disorder, unspecified: Secondary | ICD-10-CM

## 2018-06-30 DIAGNOSIS — M818 Other osteoporosis without current pathological fracture: Secondary | ICD-10-CM

## 2018-06-30 DIAGNOSIS — D508 Other iron deficiency anemias: Secondary | ICD-10-CM

## 2018-06-30 DIAGNOSIS — L120 Bullous pemphigoid: Secondary | ICD-10-CM

## 2018-06-30 DIAGNOSIS — I10 Essential (primary) hypertension: Principal | ICD-10-CM

## 2018-06-30 DIAGNOSIS — Z87891 Personal history of nicotine dependence: Secondary | ICD-10-CM

## 2018-06-30 DIAGNOSIS — S2242XD Multiple fractures of ribs, left side, subsequent encounter for fracture with routine healing: Secondary | ICD-10-CM

## 2018-06-30 MED ORDER — LIDOCAINE 5 % TOPICAL PATCH
MEDICATED_PATCH | Freq: Two times a day (BID) | TRANSDERMAL | 3 refills | 0.00000 days | Status: CP
Start: 2018-06-30 — End: 2018-08-29

## 2018-06-30 MED ORDER — ALENDRONATE 70 MG TABLET
ORAL_TABLET | ORAL | 11 refills | 0.00000 days | Status: CP
Start: 2018-06-30 — End: 2018-12-03

## 2018-07-03 ENCOUNTER — Encounter: Admit: 2018-07-03 | Discharge: 2018-07-04 | Payer: MEDICAID | Attending: Family | Primary: Family

## 2018-07-03 DIAGNOSIS — Z23 Encounter for immunization: Secondary | ICD-10-CM

## 2018-07-03 DIAGNOSIS — B182 Chronic viral hepatitis C: Principal | ICD-10-CM

## 2018-08-04 ENCOUNTER — Ambulatory Visit
Admit: 2018-08-04 | Discharge: 2018-08-05 | Payer: MEDICAID | Attending: MOHS-Micrographic Surgery | Primary: MOHS-Micrographic Surgery

## 2018-08-04 DIAGNOSIS — L120 Bullous pemphigoid: Principal | ICD-10-CM

## 2018-08-04 DIAGNOSIS — Z79899 Other long term (current) drug therapy: Secondary | ICD-10-CM

## 2018-08-04 MED ORDER — METHOTREXATE SODIUM 2.5 MG TABLET
ORAL_TABLET | ORAL | 3 refills | 0 days | Status: CP
Start: 2018-08-04 — End: 2018-11-02

## 2018-08-11 ENCOUNTER — Ambulatory Visit: Admit: 2018-08-11 | Discharge: 2018-08-12 | Payer: MEDICAID

## 2018-08-11 ENCOUNTER — Ambulatory Visit
Admit: 2018-08-11 | Discharge: 2018-08-12 | Payer: BLUE CROSS/BLUE SHIELD | Attending: Student in an Organized Health Care Education/Training Program | Primary: Student in an Organized Health Care Education/Training Program

## 2018-08-11 ENCOUNTER — Encounter: Admit: 2018-08-11 | Discharge: 2018-08-12 | Payer: MEDICAID

## 2018-08-11 DIAGNOSIS — R350 Frequency of micturition: Secondary | ICD-10-CM

## 2018-08-11 DIAGNOSIS — F329 Major depressive disorder, single episode, unspecified: Secondary | ICD-10-CM

## 2018-08-11 DIAGNOSIS — S2242XD Multiple fractures of ribs, left side, subsequent encounter for fracture with routine healing: Principal | ICD-10-CM

## 2018-08-11 DIAGNOSIS — K659 Peritonitis, unspecified: Secondary | ICD-10-CM

## 2018-08-11 DIAGNOSIS — R7303 Prediabetes: Principal | ICD-10-CM

## 2018-08-11 DIAGNOSIS — F419 Anxiety disorder, unspecified: Secondary | ICD-10-CM

## 2018-08-11 DIAGNOSIS — E039 Hypothyroidism, unspecified: Secondary | ICD-10-CM

## 2018-08-11 DIAGNOSIS — L120 Bullous pemphigoid: Secondary | ICD-10-CM

## 2018-08-11 DIAGNOSIS — I10 Essential (primary) hypertension: Principal | ICD-10-CM

## 2018-08-11 DIAGNOSIS — Z87891 Personal history of nicotine dependence: Secondary | ICD-10-CM

## 2018-08-12 MED ORDER — LEVOFLOXACIN 500 MG TABLET
ORAL_TABLET | Freq: Every day | ORAL | 0 refills | 0.00000 days | Status: CP
Start: 2018-08-12 — End: 2018-08-19

## 2018-08-17 ENCOUNTER — Encounter: Admit: 2018-08-17 | Discharge: 2018-08-18 | Payer: MEDICAID

## 2018-08-17 DIAGNOSIS — K659 Peritonitis, unspecified: Principal | ICD-10-CM

## 2018-08-25 ENCOUNTER — Encounter: Admit: 2018-08-25 | Discharge: 2018-08-25 | Payer: MEDICAID

## 2018-08-25 ENCOUNTER — Ambulatory Visit
Admit: 2018-08-25 | Discharge: 2018-08-25 | Payer: MEDICAID | Attending: Student in an Organized Health Care Education/Training Program | Primary: Student in an Organized Health Care Education/Training Program

## 2018-08-25 ENCOUNTER — Ambulatory Visit
Admit: 2018-08-25 | Discharge: 2018-08-25 | Payer: MEDICAID | Attending: Cardiovascular Disease | Primary: Cardiovascular Disease

## 2018-08-25 DIAGNOSIS — K635 Polyp of colon: Principal | ICD-10-CM

## 2018-08-25 DIAGNOSIS — I81 Portal vein thrombosis: Principal | ICD-10-CM

## 2018-08-25 DIAGNOSIS — Z9289 Personal history of other medical treatment: Principal | ICD-10-CM

## 2018-08-25 DIAGNOSIS — E039 Hypothyroidism, unspecified: Principal | ICD-10-CM

## 2018-08-25 DIAGNOSIS — J189 Pneumonia, unspecified organism: Principal | ICD-10-CM

## 2018-08-25 DIAGNOSIS — B1920 Unspecified viral hepatitis C without hepatic coma: Principal | ICD-10-CM

## 2018-08-25 DIAGNOSIS — I2510 Atherosclerotic heart disease of native coronary artery without angina pectoris: Principal | ICD-10-CM

## 2018-08-25 DIAGNOSIS — K5649 Other impaction of intestine: Principal | ICD-10-CM

## 2018-08-25 DIAGNOSIS — J45909 Unspecified asthma, uncomplicated: Principal | ICD-10-CM

## 2018-08-25 DIAGNOSIS — E079 Disorder of thyroid, unspecified: Principal | ICD-10-CM

## 2018-08-25 DIAGNOSIS — E785 Hyperlipidemia, unspecified: Principal | ICD-10-CM

## 2018-08-25 DIAGNOSIS — I219 Acute myocardial infarction, unspecified: Principal | ICD-10-CM

## 2018-08-25 DIAGNOSIS — I10 Essential (primary) hypertension: Principal | ICD-10-CM

## 2018-08-25 DIAGNOSIS — I639 Cerebral infarction, unspecified: Principal | ICD-10-CM

## 2018-08-25 DIAGNOSIS — M1990 Unspecified osteoarthritis, unspecified site: Principal | ICD-10-CM

## 2018-08-25 DIAGNOSIS — I4891 Unspecified atrial fibrillation: Principal | ICD-10-CM

## 2018-08-25 DIAGNOSIS — L120 Bullous pemphigoid: Principal | ICD-10-CM

## 2018-08-25 DIAGNOSIS — J45901 Unspecified asthma with (acute) exacerbation: Principal | ICD-10-CM

## 2018-08-25 MED ORDER — OMEPRAZOLE 20 MG CAPSULE,DELAYED RELEASE
ORAL_CAPSULE | Freq: Every day | ORAL | 2 refills | 0 days | Status: CP
Start: 2018-08-25 — End: 2018-11-24

## 2018-08-25 MED ORDER — DICLOFENAC 1 % TOPICAL GEL
Freq: Four times a day (QID) | TOPICAL | 1 refills | 0.00000 days | Status: CP
Start: 2018-08-25 — End: 2019-01-01

## 2018-08-25 MED ORDER — PREDNISONE 2.5 MG TABLET
ORAL_TABLET | 0 refills | 0 days | Status: CP
Start: 2018-08-25 — End: 2018-12-03

## 2018-08-26 DIAGNOSIS — I8290 Acute embolism and thrombosis of unspecified vein: Principal | ICD-10-CM

## 2018-09-11 ENCOUNTER — Ambulatory Visit: Payer: Medicaid Other | Admitting: Gastroenterology

## 2018-09-17 MED ORDER — LEVOTHYROXINE 88 MCG TABLET
ORAL_TABLET | Freq: Every day | ORAL | 11 refills | 0 days
Start: 2018-09-17 — End: 2018-10-02

## 2018-09-22 MED ORDER — METFORMIN 500 MG TABLET
ORAL_TABLET | Freq: Two times a day (BID) | ORAL | 3 refills | 0.00000 days | Status: CP | PRN
Start: 2018-09-22 — End: 2018-12-03

## 2018-10-02 ENCOUNTER — Telehealth: Admit: 2018-10-02 | Discharge: 2018-10-02 | Payer: MEDICAID | Attending: Internal Medicine | Primary: Internal Medicine

## 2018-10-02 ENCOUNTER — Encounter: Admit: 2018-10-02 | Discharge: 2018-10-02 | Payer: MEDICAID

## 2018-10-02 DIAGNOSIS — I8290 Acute embolism and thrombosis of unspecified vein: Principal | ICD-10-CM

## 2018-10-02 DIAGNOSIS — I81 Portal vein thrombosis: Secondary | ICD-10-CM

## 2018-10-02 MED ORDER — LEVOTHYROXINE 88 MCG TABLET
ORAL_TABLET | Freq: Every day | ORAL | 11 refills | 0.00000 days | Status: CP
Start: 2018-10-02 — End: 2018-10-02

## 2018-10-02 MED ORDER — LEVOTHYROXINE 88 MCG TABLET: 88 ug | tablet | Freq: Every day | 11 refills | 0 days | Status: AC

## 2018-10-03 ENCOUNTER — Encounter
Admit: 2018-10-03 | Discharge: 2018-10-04 | Payer: MEDICAID | Attending: Student in an Organized Health Care Education/Training Program | Primary: Student in an Organized Health Care Education/Training Program

## 2018-10-03 DIAGNOSIS — R600 Localized edema: Principal | ICD-10-CM

## 2018-10-31 MED ORDER — CITALOPRAM 40 MG TABLET
ORAL_TABLET | Freq: Every day | ORAL | 3 refills | 0.00000 days | Status: CP
Start: 2018-10-31 — End: 2019-10-31

## 2018-11-03 MED ORDER — CLOBETASOL 0.05 % TOPICAL OINTMENT
Freq: Two times a day (BID) | TOPICAL | 1 refills | 0.00000 days | Status: CP
Start: 2018-11-03 — End: 2019-11-03

## 2018-11-03 MED ORDER — NIACINAMIDE 500 MG TABLET
ORAL_TABLET | Freq: Two times a day (BID) | ORAL | 3 refills | 0.00000 days | Status: CP
Start: 2018-11-03 — End: 2019-11-03

## 2018-11-10 MED ORDER — POLYETHYLENE GLYCOL 3350 17 GRAM ORAL POWDER PACKET
PACK | 0 refills | 0 days
Start: 2018-11-10 — End: ?

## 2018-11-24 MED ORDER — DICLOFENAC 1 % TOPICAL GEL
Freq: Four times a day (QID) | TOPICAL | 11 refills | 0.00000 days | Status: CP
Start: 2018-11-24 — End: 2019-11-24

## 2018-11-24 MED ORDER — OMEPRAZOLE 40 MG CAPSULE,DELAYED RELEASE
3 refills | 0 days | Status: CP
Start: 2018-11-24 — End: 2019-01-16

## 2018-11-26 MED ORDER — AMLODIPINE 5 MG TABLET
ORAL_TABLET | Freq: Every day | ORAL | 3 refills | 0.00000 days
Start: 2018-11-26 — End: 2018-12-03

## 2018-12-01 ENCOUNTER — Institutional Professional Consult (permissible substitution)
Admit: 2018-12-01 | Discharge: 2018-12-02 | Payer: MEDICAID | Attending: Student in an Organized Health Care Education/Training Program | Primary: Student in an Organized Health Care Education/Training Program

## 2018-12-01 DIAGNOSIS — B182 Chronic viral hepatitis C: Secondary | ICD-10-CM

## 2018-12-01 DIAGNOSIS — I81 Portal vein thrombosis: Secondary | ICD-10-CM

## 2018-12-01 DIAGNOSIS — R140 Abdominal distension (gaseous): Principal | ICD-10-CM

## 2018-12-03 ENCOUNTER — Encounter
Admit: 2018-12-03 | Discharge: 2018-12-04 | Payer: MEDICAID | Attending: Student in an Organized Health Care Education/Training Program | Primary: Student in an Organized Health Care Education/Training Program

## 2018-12-03 ENCOUNTER — Encounter: Admit: 2018-12-03 | Discharge: 2018-12-04 | Payer: MEDICAID

## 2018-12-03 DIAGNOSIS — F419 Anxiety disorder, unspecified: Secondary | ICD-10-CM

## 2018-12-03 DIAGNOSIS — I8290 Acute embolism and thrombosis of unspecified vein: Principal | ICD-10-CM

## 2018-12-03 DIAGNOSIS — F329 Major depressive disorder, single episode, unspecified: Secondary | ICD-10-CM

## 2018-12-03 DIAGNOSIS — R1012 Left upper quadrant pain: Principal | ICD-10-CM

## 2018-12-03 DIAGNOSIS — I10 Essential (primary) hypertension: Secondary | ICD-10-CM

## 2018-12-03 MED ORDER — AMLODIPINE 5 MG TABLET: 10 mg | tablet | Freq: Every day | 3 refills | 0 days | Status: AC

## 2018-12-03 MED ORDER — AMLODIPINE 5 MG TABLET
ORAL_TABLET | Freq: Every day | ORAL | 3 refills | 0.00000 days | Status: CP
Start: 2018-12-03 — End: 2018-12-03

## 2018-12-08 ENCOUNTER — Institutional Professional Consult (permissible substitution)
Admit: 2018-12-08 | Discharge: 2018-12-09 | Payer: MEDICAID | Attending: Student in an Organized Health Care Education/Training Program | Primary: Student in an Organized Health Care Education/Training Program

## 2018-12-08 DIAGNOSIS — I10 Essential (primary) hypertension: Principal | ICD-10-CM

## 2018-12-08 MED ORDER — ACETAMINOPHEN 500 MG TABLET
ORAL_TABLET | Freq: Three times a day (TID) | ORAL | 0 refills | 0 days | Status: CP
Start: 2018-12-08 — End: 2019-02-11

## 2018-12-08 MED ORDER — ONDANSETRON HCL 4 MG TABLET
ORAL_TABLET | Freq: Every day | ORAL | 0 refills | 0 days | Status: CP | PRN
Start: 2018-12-08 — End: 2019-01-28

## 2018-12-10 MED ORDER — AMLODIPINE 5 MG TABLET
ORAL_TABLET | Freq: Every day | ORAL | 3 refills | 0.00000 days | Status: CP
Start: 2018-12-10 — End: 2019-01-01

## 2018-12-10 MED ORDER — AMLODIPINE 5 MG TABLET: 10 mg | tablet | Freq: Every day | 3 refills | 0 days | Status: AC

## 2018-12-12 MED ORDER — HYDROXYZINE HCL 25 MG TABLET
ORAL_TABLET | Freq: Two times a day (BID) | ORAL | 0 refills | 0 days | Status: CP | PRN
Start: 2018-12-12 — End: 2019-02-11

## 2018-12-17 ENCOUNTER — Encounter: Admit: 2018-12-17 | Discharge: 2018-12-18 | Payer: MEDICAID

## 2018-12-17 DIAGNOSIS — R1012 Left upper quadrant pain: Principal | ICD-10-CM

## 2018-12-22 ENCOUNTER — Encounter
Admit: 2018-12-22 | Discharge: 2018-12-23 | Payer: MEDICAID | Attending: Student in an Organized Health Care Education/Training Program | Primary: Student in an Organized Health Care Education/Training Program

## 2019-01-01 ENCOUNTER — Encounter: Admit: 2019-01-01 | Discharge: 2019-01-02 | Payer: MEDICAID | Attending: Family | Primary: Family

## 2019-01-01 DIAGNOSIS — Z8619 Personal history of other infectious and parasitic diseases: Secondary | ICD-10-CM

## 2019-01-01 DIAGNOSIS — K769 Liver disease, unspecified: Secondary | ICD-10-CM

## 2019-01-01 DIAGNOSIS — I8290 Acute embolism and thrombosis of unspecified vein: Principal | ICD-10-CM

## 2019-01-02 ENCOUNTER — Ambulatory Visit: Admit: 2019-01-02 | Discharge: 2019-01-03 | Payer: MEDICAID

## 2019-01-02 DIAGNOSIS — H2513 Age-related nuclear cataract, bilateral: Principal | ICD-10-CM

## 2019-01-02 MED ORDER — MOXIFLOXACIN 0.5 % EYE DROPS
0 refills | 0 days | Status: CP
Start: 2019-01-02 — End: ?

## 2019-01-16 ENCOUNTER — Ambulatory Visit: Admit: 2019-01-16 | Discharge: 2019-01-16 | Payer: MEDICAID | Attending: Internal Medicine | Primary: Internal Medicine

## 2019-01-16 ENCOUNTER — Encounter: Admit: 2019-01-16 | Discharge: 2019-01-16 | Payer: MEDICAID

## 2019-01-16 DIAGNOSIS — F419 Anxiety disorder, unspecified: Secondary | ICD-10-CM

## 2019-01-16 DIAGNOSIS — R1012 Left upper quadrant pain: Principal | ICD-10-CM

## 2019-01-16 MED ORDER — HYDROXYZINE HCL 25 MG TABLET
ORAL_TABLET | Freq: Four times a day (QID) | ORAL | 0 refills | 23.00000 days | Status: CP | PRN
Start: 2019-01-16 — End: 2019-02-15

## 2019-01-16 MED ORDER — OMEPRAZOLE 40 MG CAPSULE,DELAYED RELEASE: 40 mg | capsule | Freq: Two times a day (BID) | 3 refills | 90 days | Status: AC

## 2019-01-16 MED ORDER — OMEPRAZOLE 40 MG CAPSULE,DELAYED RELEASE
ORAL_CAPSULE | Freq: Two times a day (BID) | ORAL | 3 refills | 90.00000 days | Status: CP
Start: 2019-01-16 — End: 2020-01-16

## 2019-01-23 ENCOUNTER — Non-Acute Institutional Stay: Admit: 2019-01-23 | Discharge: 2019-01-24 | Payer: MEDICAID

## 2019-01-23 ENCOUNTER — Encounter: Admit: 2019-01-23 | Discharge: 2019-01-24 | Payer: MEDICAID

## 2019-01-23 ENCOUNTER — Encounter
Admit: 2019-01-23 | Discharge: 2019-01-24 | Payer: MEDICAID | Attending: Student in an Organized Health Care Education/Training Program | Primary: Student in an Organized Health Care Education/Training Program

## 2019-01-23 DIAGNOSIS — E119 Type 2 diabetes mellitus without complications: Secondary | ICD-10-CM

## 2019-01-23 DIAGNOSIS — L120 Bullous pemphigoid: Secondary | ICD-10-CM

## 2019-01-23 DIAGNOSIS — M329 Systemic lupus erythematosus, unspecified: Secondary | ICD-10-CM

## 2019-01-23 DIAGNOSIS — Z01818 Encounter for other preprocedural examination: Principal | ICD-10-CM

## 2019-01-26 ENCOUNTER — Ambulatory Visit: Admit: 2019-01-26 | Discharge: 2019-01-27 | Payer: MEDICAID | Attending: Family | Primary: Family

## 2019-01-26 DIAGNOSIS — Z1159 Encounter for screening for other viral diseases: Principal | ICD-10-CM

## 2019-01-28 ENCOUNTER — Encounter: Admit: 2019-01-28 | Discharge: 2019-01-28 | Payer: MEDICAID | Attending: Anesthesiology | Primary: Anesthesiology

## 2019-01-28 ENCOUNTER — Encounter: Admit: 2019-01-28 | Discharge: 2019-01-28 | Payer: MEDICAID

## 2019-01-28 DIAGNOSIS — H2511 Age-related nuclear cataract, right eye: Principal | ICD-10-CM

## 2019-01-28 MED ORDER — MOXIFLOXACIN 0.5 % EYE DROPS
0 refills | 0 days | Status: CP
Start: 2019-01-28 — End: 2019-02-20

## 2019-01-28 MED ORDER — KETOROLAC 0.5 % EYE DROPS
0 refills | 0 days | Status: CP
Start: 2019-01-28 — End: ?

## 2019-01-28 MED ORDER — PREDNISOLONE ACETATE 1 % EYE DROPS,SUSPENSION
0 refills | 0 days | Status: CP
Start: 2019-01-28 — End: ?

## 2019-01-29 ENCOUNTER — Ambulatory Visit
Admit: 2019-01-29 | Discharge: 2019-01-30 | Payer: MEDICAID | Attending: Student in an Organized Health Care Education/Training Program | Primary: Student in an Organized Health Care Education/Training Program

## 2019-01-29 DIAGNOSIS — Z9841 Cataract extraction status, right eye: Principal | ICD-10-CM

## 2019-01-29 DIAGNOSIS — Z961 Presence of intraocular lens: Secondary | ICD-10-CM

## 2019-02-03 ENCOUNTER — Encounter
Admit: 2019-02-03 | Discharge: 2019-02-04 | Payer: MEDICAID | Attending: Student in an Organized Health Care Education/Training Program | Primary: Student in an Organized Health Care Education/Training Program

## 2019-02-03 DIAGNOSIS — F419 Anxiety disorder, unspecified: Secondary | ICD-10-CM

## 2019-02-03 DIAGNOSIS — R1012 Left upper quadrant pain: Principal | ICD-10-CM

## 2019-02-03 DIAGNOSIS — J4520 Mild intermittent asthma, uncomplicated: Secondary | ICD-10-CM

## 2019-02-03 DIAGNOSIS — L120 Bullous pemphigoid: Secondary | ICD-10-CM

## 2019-02-03 DIAGNOSIS — K30 Functional dyspepsia: Secondary | ICD-10-CM

## 2019-02-03 MED ORDER — ALBUTEROL SULFATE HFA 90 MCG/ACTUATION AEROSOL INHALER
RESPIRATORY_TRACT | 0 refills | 0.00000 days | Status: CP | PRN
Start: 2019-02-03 — End: 2019-06-03

## 2019-02-09 ENCOUNTER — Encounter: Admit: 2019-02-09 | Discharge: 2019-02-10 | Payer: MEDICAID | Attending: Family | Primary: Family

## 2019-02-09 DIAGNOSIS — Z1159 Encounter for screening for other viral diseases: Principal | ICD-10-CM

## 2019-02-11 ENCOUNTER — Encounter: Admit: 2019-02-11 | Discharge: 2019-02-11 | Payer: MEDICAID

## 2019-02-11 DIAGNOSIS — H2512 Age-related nuclear cataract, left eye: Principal | ICD-10-CM

## 2019-02-11 MED ORDER — MOXIFLOXACIN 0.5 % EYE DROPS
0 refills | 0 days | Status: CP
Start: 2019-02-11 — End: ?

## 2019-02-11 MED ORDER — KETOROLAC 0.5 % EYE DROPS
0 refills | 0 days | Status: CP
Start: 2019-02-11 — End: ?

## 2019-02-11 MED ORDER — PREDNISOLONE ACETATE 1 % EYE DROPS,SUSPENSION
0 refills | 0 days | Status: CP
Start: 2019-02-11 — End: ?

## 2019-02-12 ENCOUNTER — Encounter
Admit: 2019-02-12 | Discharge: 2019-02-13 | Payer: MEDICAID | Attending: Student in an Organized Health Care Education/Training Program | Primary: Student in an Organized Health Care Education/Training Program

## 2019-02-12 DIAGNOSIS — Z9841 Cataract extraction status, right eye: Secondary | ICD-10-CM

## 2019-02-12 DIAGNOSIS — Z961 Presence of intraocular lens: Secondary | ICD-10-CM

## 2019-02-12 DIAGNOSIS — Z9842 Cataract extraction status, left eye: Principal | ICD-10-CM

## 2019-02-20 ENCOUNTER — Encounter: Admit: 2019-02-20 | Discharge: 2019-02-21 | Payer: MEDICAID

## 2019-02-20 DIAGNOSIS — L120 Bullous pemphigoid: Secondary | ICD-10-CM

## 2019-02-20 MED ORDER — TRIAMCINOLONE ACETONIDE 0.1 % TOPICAL OINTMENT
6 refills | 0 days | Status: CP
Start: 2019-02-20 — End: ?

## 2019-02-26 DIAGNOSIS — L120 Bullous pemphigoid: Secondary | ICD-10-CM

## 2019-02-26 DIAGNOSIS — Z79899 Other long term (current) drug therapy: Secondary | ICD-10-CM

## 2019-02-27 MED ORDER — PREDNISONE 10 MG TABLET
ORAL_TABLET | Freq: Every day | ORAL | 0 refills | 30 days | Status: CP
Start: 2019-02-27 — End: 2019-03-29

## 2019-03-03 DIAGNOSIS — L120 Bullous pemphigoid: Secondary | ICD-10-CM

## 2019-03-03 DIAGNOSIS — Z79899 Other long term (current) drug therapy: Secondary | ICD-10-CM

## 2019-03-05 ENCOUNTER — Encounter: Admit: 2019-03-05 | Discharge: 2019-03-05 | Payer: MEDICAID

## 2019-03-05 DIAGNOSIS — Z8619 Personal history of other infectious and parasitic diseases: Secondary | ICD-10-CM

## 2019-03-05 DIAGNOSIS — L120 Bullous pemphigoid: Secondary | ICD-10-CM

## 2019-03-05 DIAGNOSIS — K769 Liver disease, unspecified: Secondary | ICD-10-CM

## 2019-03-05 DIAGNOSIS — Z79899 Other long term (current) drug therapy: Secondary | ICD-10-CM

## 2019-03-05 DIAGNOSIS — M25562 Pain in left knee: Secondary | ICD-10-CM

## 2019-03-05 DIAGNOSIS — M170 Bilateral primary osteoarthritis of knee: Secondary | ICD-10-CM

## 2019-03-05 DIAGNOSIS — R200 Anesthesia of skin: Secondary | ICD-10-CM

## 2019-03-05 DIAGNOSIS — M25561 Pain in right knee: Secondary | ICD-10-CM

## 2019-03-05 DIAGNOSIS — E669 Obesity, unspecified: Secondary | ICD-10-CM

## 2019-03-05 DIAGNOSIS — I8290 Acute embolism and thrombosis of unspecified vein: Secondary | ICD-10-CM

## 2019-03-05 DIAGNOSIS — M5410 Radiculopathy, site unspecified: Secondary | ICD-10-CM

## 2019-03-11 ENCOUNTER — Encounter: Admit: 2019-03-11 | Discharge: 2019-03-11 | Payer: MEDICAID

## 2019-03-11 DIAGNOSIS — I8290 Acute embolism and thrombosis of unspecified vein: Secondary | ICD-10-CM

## 2019-03-11 DIAGNOSIS — K769 Liver disease, unspecified: Secondary | ICD-10-CM

## 2019-03-16 ENCOUNTER — Encounter: Admit: 2019-03-16 | Discharge: 2019-03-17 | Payer: MEDICAID | Attending: Clinical | Primary: Clinical

## 2019-03-16 DIAGNOSIS — L120 Bullous pemphigoid: Secondary | ICD-10-CM

## 2019-03-16 DIAGNOSIS — R1012 Left upper quadrant pain: Secondary | ICD-10-CM

## 2019-03-16 DIAGNOSIS — F419 Anxiety disorder, unspecified: Secondary | ICD-10-CM

## 2019-03-16 MED ORDER — GABAPENTIN 400 MG CAPSULE
ORAL_CAPSULE | Freq: Three times a day (TID) | ORAL | 3 refills | 15 days | Status: CP
Start: 2019-03-16 — End: ?

## 2019-03-16 MED ORDER — HYDROXYZINE HCL 25 MG TABLET
ORAL_TABLET | Freq: Four times a day (QID) | ORAL | 3 refills | 23 days | Status: CP | PRN
Start: 2019-03-16 — End: 2019-04-15

## 2019-03-20 DIAGNOSIS — Z79899 Other long term (current) drug therapy: Secondary | ICD-10-CM

## 2019-03-20 MED ORDER — AZATHIOPRINE 50 MG TABLET
ORAL_TABLET | Freq: Every day | ORAL | 0 refills | 60 days | Status: CP
Start: 2019-03-20 — End: 2019-05-19

## 2019-03-23 ENCOUNTER — Encounter
Admit: 2019-03-23 | Discharge: 2019-03-24 | Payer: MEDICAID | Attending: Student in an Organized Health Care Education/Training Program | Primary: Student in an Organized Health Care Education/Training Program

## 2019-03-23 DIAGNOSIS — E039 Hypothyroidism, unspecified: Secondary | ICD-10-CM

## 2019-03-23 DIAGNOSIS — F419 Anxiety disorder, unspecified: Secondary | ICD-10-CM

## 2019-03-23 DIAGNOSIS — D508 Other iron deficiency anemias: Secondary | ICD-10-CM

## 2019-03-23 DIAGNOSIS — L120 Bullous pemphigoid: Secondary | ICD-10-CM

## 2019-03-23 MED ORDER — MELATONIN 3 MG TABLET
ORAL_TABLET | Freq: Every evening | ORAL | 3 refills | 90 days | Status: CP
Start: 2019-03-23 — End: 2020-03-22

## 2019-03-23 MED ORDER — SENNA 8.6 MG TABLET
ORAL_TABLET | Freq: Every evening | ORAL | 11 refills | 30.00000 days | Status: CP
Start: 2019-03-23 — End: 2020-03-22

## 2019-03-25 ENCOUNTER — Encounter: Admit: 2019-03-25 | Discharge: 2019-03-26 | Payer: MEDICAID | Attending: Family | Primary: Family

## 2019-03-25 DIAGNOSIS — F4312 Post-traumatic stress disorder, chronic: Secondary | ICD-10-CM

## 2019-03-25 DIAGNOSIS — F419 Anxiety disorder, unspecified: Secondary | ICD-10-CM

## 2019-03-25 DIAGNOSIS — R1012 Left upper quadrant pain: Secondary | ICD-10-CM

## 2019-03-26 MED ORDER — TRAZODONE 50 MG TABLET
ORAL_TABLET | 0 refills | 0 days | Status: CP
Start: 2019-03-26 — End: ?

## 2019-03-27 ENCOUNTER — Encounter: Admit: 2019-03-27 | Discharge: 2019-03-28 | Payer: MEDICAID

## 2019-03-27 DIAGNOSIS — D508 Other iron deficiency anemias: Secondary | ICD-10-CM

## 2019-03-27 DIAGNOSIS — Z79899 Other long term (current) drug therapy: Secondary | ICD-10-CM

## 2019-03-27 DIAGNOSIS — E039 Hypothyroidism, unspecified: Secondary | ICD-10-CM

## 2019-04-10 DIAGNOSIS — E611 Iron deficiency: Principal | ICD-10-CM

## 2019-04-20 DIAGNOSIS — J4520 Mild intermittent asthma, uncomplicated: Principal | ICD-10-CM

## 2019-04-20 IMAGING — DX DG ABDOMEN 1V
2 series · 2 of 2 positions shown · non-contrast
Comparison: 04/20/2018

CLINICAL DATA: Abdominal distention for 1 week

EXAM:
ABDOMEN - 1 VIEW

[abdomen supine (1 of 2)]
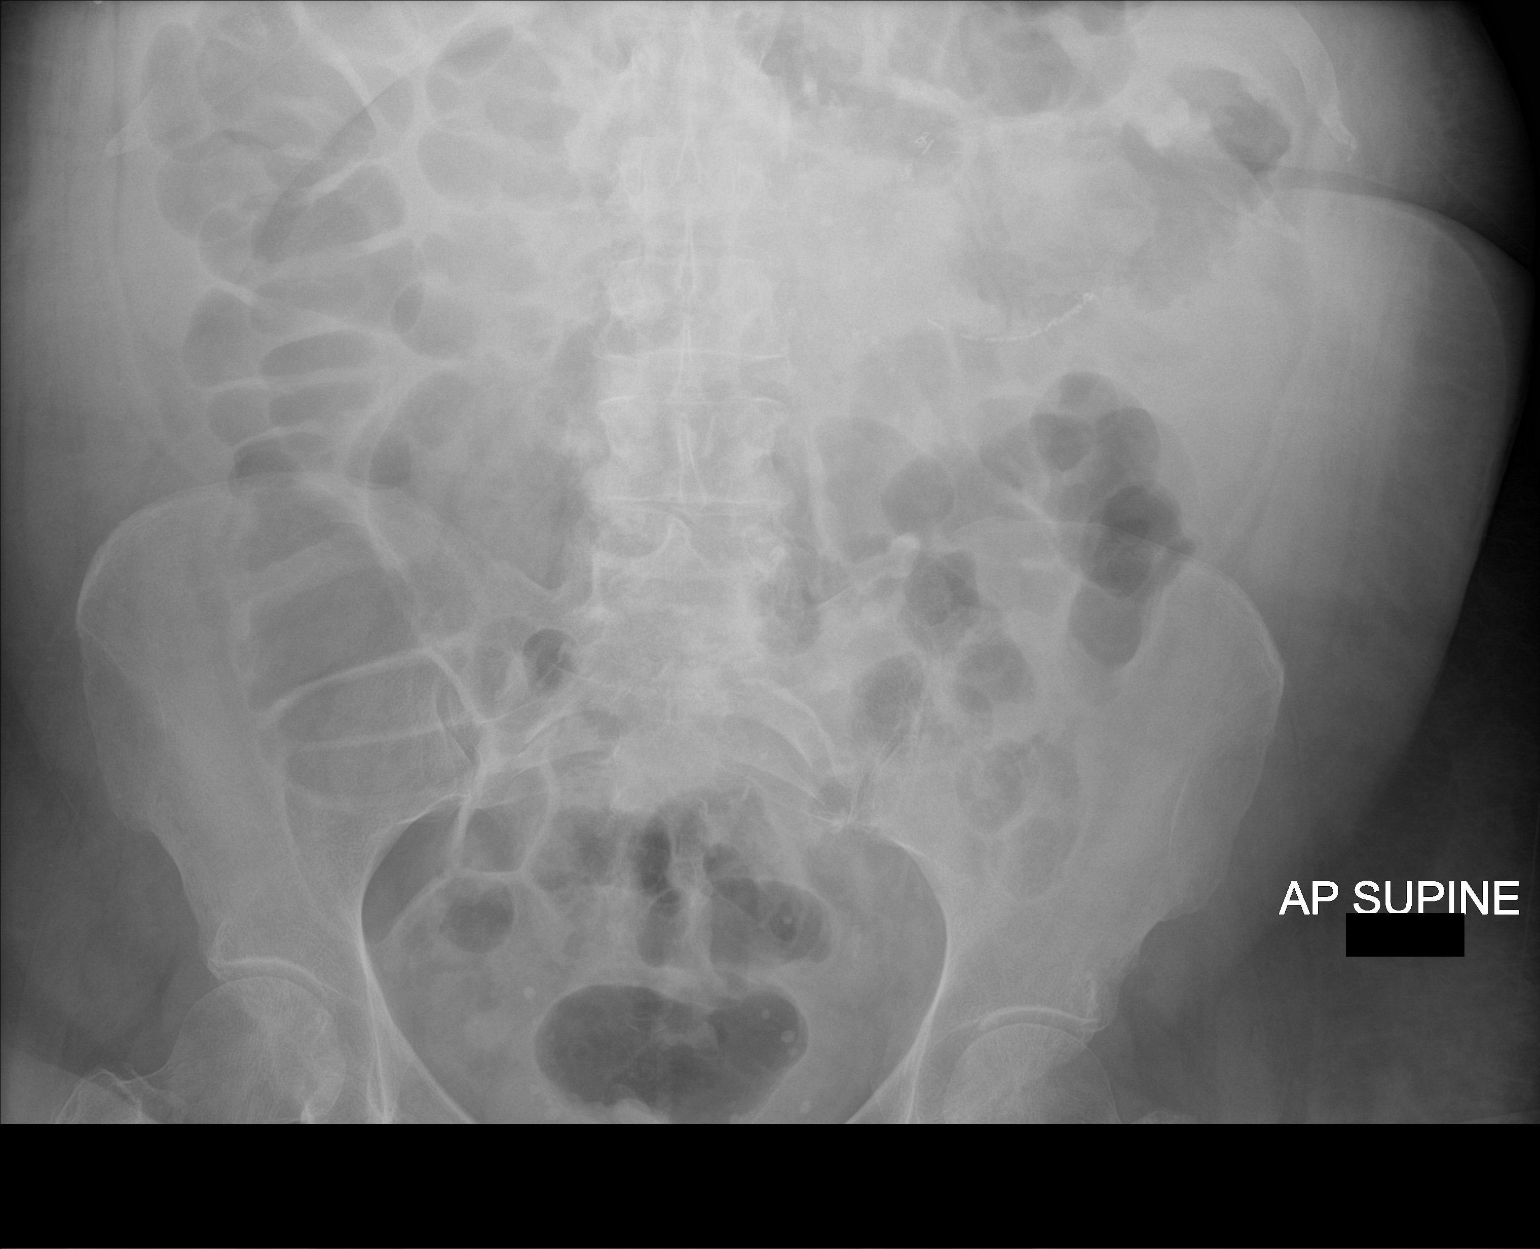

[abdomen supine (2 of 2)]
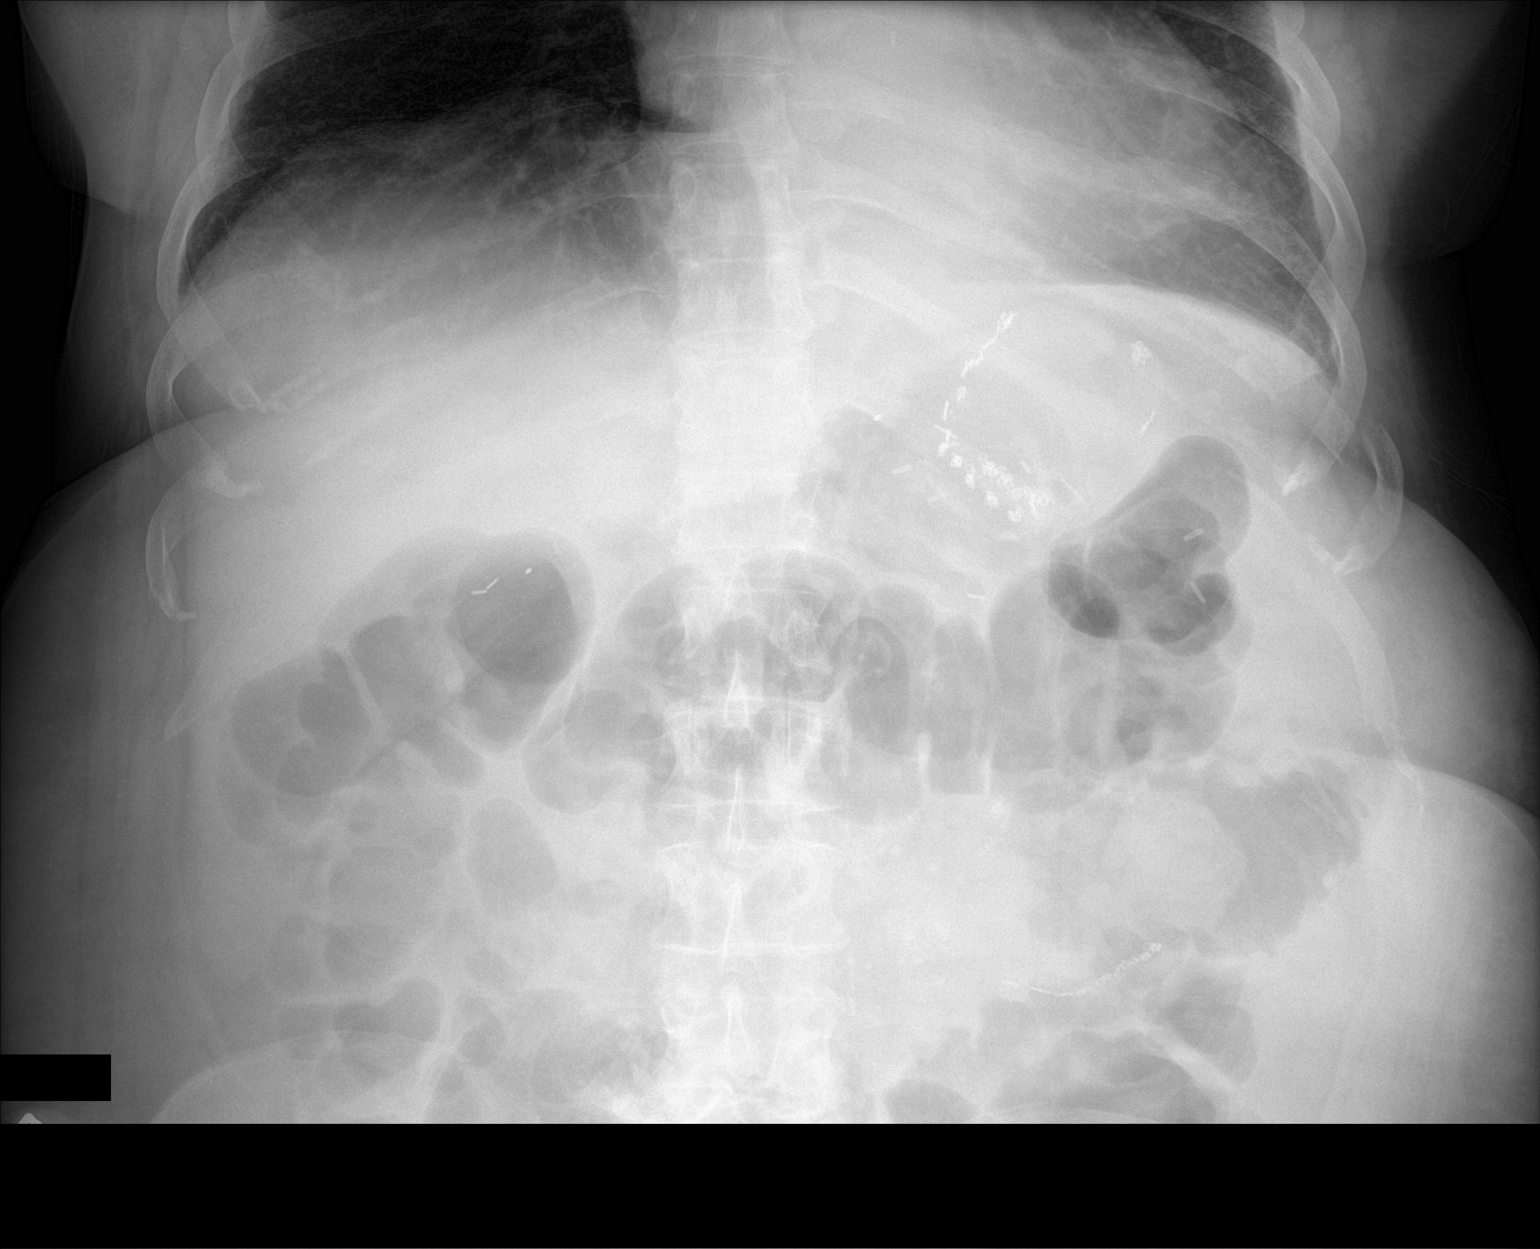

[2 of 2 positions shown; findings below may reference images not displayed]

FINDINGS: Scattered large and small bowel gas is noted. Postsurgical changes
are again identified. No obstructive changes are seen. No findings
of constipation are noted. No acute bony abnormality is seen.
IMPRESSION: No acute abnormality noted.

## 2019-04-26 DEATH — deceased
